# Patient Record
Sex: Male | Born: 1935 | Race: Black or African American | Hispanic: No | Smoking: Former smoker
Health system: Southern US, Community
[De-identification: ages and names within clinical notes are randomized; demographics above are authoritative.]

## PROBLEM LIST (undated history)

## (undated) DIAGNOSIS — Z923 Personal history of irradiation: Secondary | ICD-10-CM

## (undated) DIAGNOSIS — D649 Anemia, unspecified: Secondary | ICD-10-CM

## (undated) DIAGNOSIS — H409 Unspecified glaucoma: Secondary | ICD-10-CM

## (undated) DIAGNOSIS — Z9289 Personal history of other medical treatment: Secondary | ICD-10-CM

## (undated) DIAGNOSIS — E7800 Pure hypercholesterolemia, unspecified: Secondary | ICD-10-CM

## (undated) DIAGNOSIS — M1990 Unspecified osteoarthritis, unspecified site: Secondary | ICD-10-CM

## (undated) DIAGNOSIS — C61 Malignant neoplasm of prostate: Secondary | ICD-10-CM

## (undated) DIAGNOSIS — J45909 Unspecified asthma, uncomplicated: Secondary | ICD-10-CM

## (undated) DIAGNOSIS — C169 Malignant neoplasm of stomach, unspecified: Secondary | ICD-10-CM

## (undated) DIAGNOSIS — E119 Type 2 diabetes mellitus without complications: Secondary | ICD-10-CM

## (undated) DIAGNOSIS — J449 Chronic obstructive pulmonary disease, unspecified: Secondary | ICD-10-CM

## (undated) HISTORY — PX: TUMOR EXCISION: SHX421

## (undated) HISTORY — PX: CARDIAC CATHETERIZATION: SHX172

---

## 1997-11-30 ENCOUNTER — Other Ambulatory Visit: Admission: RE | Admit: 1997-11-30 | Discharge: 1997-11-30 | Payer: Self-pay | Admitting: Internal Medicine

## 1998-06-18 ENCOUNTER — Encounter: Payer: Self-pay | Admitting: Emergency Medicine

## 1998-06-18 ENCOUNTER — Inpatient Hospital Stay: Admission: EM | Admit: 1998-06-18 | Discharge: 1998-06-21 | Payer: Self-pay | Admitting: Emergency Medicine

## 1999-03-10 ENCOUNTER — Encounter: Payer: Self-pay | Admitting: Emergency Medicine

## 1999-03-10 ENCOUNTER — Emergency Department (HOSPITAL_COMMUNITY): Admission: EM | Admit: 1999-03-10 | Discharge: 1999-03-10 | Payer: Self-pay | Admitting: Emergency Medicine

## 1999-04-24 ENCOUNTER — Emergency Department (HOSPITAL_COMMUNITY): Admission: EM | Admit: 1999-04-24 | Discharge: 1999-04-24 | Payer: Self-pay | Admitting: Emergency Medicine

## 1999-04-24 ENCOUNTER — Encounter: Payer: Self-pay | Admitting: Emergency Medicine

## 1999-05-18 ENCOUNTER — Emergency Department (HOSPITAL_COMMUNITY): Admission: EM | Admit: 1999-05-18 | Discharge: 1999-05-18 | Payer: Self-pay | Admitting: Emergency Medicine

## 1999-05-18 ENCOUNTER — Encounter: Payer: Self-pay | Admitting: Emergency Medicine

## 1999-11-11 ENCOUNTER — Encounter: Payer: Self-pay | Admitting: Emergency Medicine

## 1999-11-11 ENCOUNTER — Emergency Department (HOSPITAL_COMMUNITY): Admission: EM | Admit: 1999-11-11 | Discharge: 1999-11-11 | Payer: Self-pay | Admitting: Emergency Medicine

## 2000-01-05 ENCOUNTER — Encounter: Admission: RE | Admit: 2000-01-05 | Discharge: 2000-01-05 | Payer: Self-pay | Admitting: Internal Medicine

## 2000-01-05 ENCOUNTER — Encounter: Payer: Self-pay | Admitting: Internal Medicine

## 2000-04-12 ENCOUNTER — Encounter: Admission: RE | Admit: 2000-04-12 | Discharge: 2000-04-19 | Payer: Self-pay | Admitting: Neurological Surgery

## 2000-09-15 HISTORY — PX: HEMORRHOID SURGERY: SHX153

## 2000-09-16 ENCOUNTER — Ambulatory Visit (HOSPITAL_COMMUNITY): Admission: RE | Admit: 2000-09-16 | Discharge: 2000-09-16 | Payer: Self-pay | Admitting: Gastroenterology

## 2000-09-17 ENCOUNTER — Encounter: Admission: RE | Admit: 2000-09-17 | Discharge: 2000-12-16 | Payer: Self-pay | Admitting: Internal Medicine

## 2000-09-29 ENCOUNTER — Encounter (INDEPENDENT_AMBULATORY_CARE_PROVIDER_SITE_OTHER): Payer: Self-pay | Admitting: Specialist

## 2000-09-29 ENCOUNTER — Ambulatory Visit (HOSPITAL_COMMUNITY): Admission: RE | Admit: 2000-09-29 | Discharge: 2000-09-29 | Payer: Self-pay | Admitting: General Surgery

## 2001-03-14 ENCOUNTER — Encounter: Admission: RE | Admit: 2001-03-14 | Discharge: 2001-06-12 | Payer: Self-pay | Admitting: Internal Medicine

## 2001-06-03 DIAGNOSIS — C61 Malignant neoplasm of prostate: Secondary | ICD-10-CM

## 2001-06-03 HISTORY — DX: Malignant neoplasm of prostate: C61

## 2001-08-29 ENCOUNTER — Encounter: Admission: RE | Admit: 2001-08-29 | Discharge: 2001-11-27 | Payer: Self-pay | Admitting: Internal Medicine

## 2001-12-19 ENCOUNTER — Encounter: Admission: RE | Admit: 2001-12-19 | Discharge: 2002-03-19 | Payer: Self-pay | Admitting: Internal Medicine

## 2002-05-23 ENCOUNTER — Encounter: Admission: RE | Admit: 2002-05-23 | Discharge: 2002-08-21 | Payer: Self-pay | Admitting: Internal Medicine

## 2002-06-30 ENCOUNTER — Encounter: Admission: RE | Admit: 2002-06-30 | Discharge: 2002-06-30 | Payer: Self-pay | Admitting: Urology

## 2002-06-30 ENCOUNTER — Encounter: Payer: Self-pay | Admitting: Urology

## 2002-09-19 ENCOUNTER — Encounter: Admission: RE | Admit: 2002-09-19 | Discharge: 2002-12-18 | Payer: Self-pay | Admitting: Internal Medicine

## 2002-12-25 ENCOUNTER — Ambulatory Visit: Admission: RE | Admit: 2002-12-25 | Discharge: 2003-01-26 | Payer: Self-pay | Admitting: *Deleted

## 2003-01-02 ENCOUNTER — Encounter: Payer: Self-pay | Admitting: Radiation Oncology

## 2003-01-02 ENCOUNTER — Ambulatory Visit (HOSPITAL_COMMUNITY): Admission: RE | Admit: 2003-01-02 | Discharge: 2003-01-02 | Payer: Self-pay | Admitting: Radiation Oncology

## 2003-03-16 ENCOUNTER — Ambulatory Visit: Admission: RE | Admit: 2003-03-16 | Discharge: 2003-06-14 | Payer: Self-pay | Admitting: Radiation Oncology

## 2003-03-22 ENCOUNTER — Encounter: Admission: RE | Admit: 2003-03-22 | Discharge: 2003-06-20 | Payer: Self-pay | Admitting: Internal Medicine

## 2003-06-25 ENCOUNTER — Ambulatory Visit: Admission: RE | Admit: 2003-06-25 | Discharge: 2003-06-25 | Payer: Self-pay | Admitting: Radiation Oncology

## 2003-06-27 ENCOUNTER — Encounter: Admission: RE | Admit: 2003-06-27 | Discharge: 2003-09-25 | Payer: Self-pay | Admitting: Internal Medicine

## 2003-12-24 ENCOUNTER — Ambulatory Visit: Admission: RE | Admit: 2003-12-24 | Discharge: 2003-12-24 | Payer: Self-pay | Admitting: Radiation Oncology

## 2003-12-25 ENCOUNTER — Ambulatory Visit: Admission: RE | Admit: 2003-12-25 | Discharge: 2003-12-26 | Payer: Self-pay | Admitting: Radiation Oncology

## 2004-02-05 ENCOUNTER — Encounter: Admission: RE | Admit: 2004-02-05 | Discharge: 2004-05-05 | Payer: Self-pay | Admitting: Internal Medicine

## 2004-04-29 ENCOUNTER — Ambulatory Visit: Admission: RE | Admit: 2004-04-29 | Discharge: 2004-04-29 | Payer: Self-pay | Admitting: Radiation Oncology

## 2004-05-05 ENCOUNTER — Ambulatory Visit: Admission: RE | Admit: 2004-05-05 | Discharge: 2004-05-05 | Payer: Self-pay | Admitting: Radiation Oncology

## 2004-08-13 ENCOUNTER — Encounter: Admission: RE | Admit: 2004-08-13 | Discharge: 2004-11-11 | Payer: Self-pay | Admitting: Internal Medicine

## 2004-09-09 ENCOUNTER — Encounter (INDEPENDENT_AMBULATORY_CARE_PROVIDER_SITE_OTHER): Payer: Self-pay | Admitting: Cardiology

## 2004-09-09 ENCOUNTER — Inpatient Hospital Stay (HOSPITAL_COMMUNITY): Admission: EM | Admit: 2004-09-09 | Discharge: 2004-09-11 | Payer: Self-pay | Admitting: Emergency Medicine

## 2004-11-14 ENCOUNTER — Ambulatory Visit (HOSPITAL_COMMUNITY): Admission: RE | Admit: 2004-11-14 | Discharge: 2004-11-14 | Payer: Self-pay | Admitting: Urology

## 2005-02-12 ENCOUNTER — Encounter: Admission: RE | Admit: 2005-02-12 | Discharge: 2005-05-13 | Payer: Self-pay | Admitting: Internal Medicine

## 2005-04-22 ENCOUNTER — Encounter: Admission: RE | Admit: 2005-04-22 | Discharge: 2005-04-22 | Payer: Self-pay | Admitting: Internal Medicine

## 2005-09-03 ENCOUNTER — Encounter: Admission: RE | Admit: 2005-09-03 | Discharge: 2005-09-03 | Payer: Self-pay | Admitting: Internal Medicine

## 2005-09-25 ENCOUNTER — Encounter: Admission: RE | Admit: 2005-09-25 | Discharge: 2005-09-25 | Payer: Self-pay | Admitting: Internal Medicine

## 2006-01-20 ENCOUNTER — Encounter: Admission: RE | Admit: 2006-01-20 | Discharge: 2006-04-20 | Payer: Self-pay | Admitting: Internal Medicine

## 2006-03-23 ENCOUNTER — Encounter: Admission: RE | Admit: 2006-03-23 | Discharge: 2006-06-21 | Payer: Self-pay | Admitting: Internal Medicine

## 2006-04-15 ENCOUNTER — Inpatient Hospital Stay (HOSPITAL_COMMUNITY): Admission: EM | Admit: 2006-04-15 | Discharge: 2006-04-17 | Payer: Self-pay | Admitting: Emergency Medicine

## 2006-10-16 ENCOUNTER — Emergency Department (HOSPITAL_COMMUNITY): Admission: EM | Admit: 2006-10-16 | Discharge: 2006-10-16 | Payer: Self-pay | Admitting: Emergency Medicine

## 2006-11-04 ENCOUNTER — Encounter: Admission: RE | Admit: 2006-11-04 | Discharge: 2006-11-04 | Payer: Self-pay | Admitting: Internal Medicine

## 2007-01-23 ENCOUNTER — Inpatient Hospital Stay (HOSPITAL_COMMUNITY): Admission: EM | Admit: 2007-01-23 | Discharge: 2007-01-24 | Payer: Self-pay | Admitting: Emergency Medicine

## 2007-02-03 ENCOUNTER — Encounter: Admission: RE | Admit: 2007-02-03 | Discharge: 2007-04-26 | Payer: Self-pay | Admitting: Internal Medicine

## 2007-05-04 ENCOUNTER — Encounter: Admission: RE | Admit: 2007-05-04 | Discharge: 2007-05-05 | Payer: Self-pay | Admitting: Internal Medicine

## 2007-08-24 ENCOUNTER — Emergency Department (HOSPITAL_COMMUNITY): Admission: EM | Admit: 2007-08-24 | Discharge: 2007-08-24 | Payer: Self-pay | Admitting: Emergency Medicine

## 2007-09-21 ENCOUNTER — Encounter: Admission: RE | Admit: 2007-09-21 | Discharge: 2007-09-21 | Payer: Self-pay | Admitting: Internal Medicine

## 2007-12-12 ENCOUNTER — Encounter: Admission: RE | Admit: 2007-12-12 | Discharge: 2008-02-07 | Payer: Self-pay | Admitting: Internal Medicine

## 2008-08-07 ENCOUNTER — Encounter: Admission: RE | Admit: 2008-08-07 | Discharge: 2008-08-07 | Payer: Self-pay | Admitting: Internal Medicine

## 2008-11-12 ENCOUNTER — Emergency Department (HOSPITAL_COMMUNITY): Admission: EM | Admit: 2008-11-12 | Discharge: 2008-11-12 | Payer: Self-pay | Admitting: Emergency Medicine

## 2008-12-13 ENCOUNTER — Ambulatory Visit (HOSPITAL_COMMUNITY): Admission: RE | Admit: 2008-12-13 | Discharge: 2008-12-13 | Payer: Self-pay | Admitting: Urology

## 2010-01-16 ENCOUNTER — Ambulatory Visit (HOSPITAL_COMMUNITY): Admission: RE | Admit: 2010-01-16 | Discharge: 2010-01-16 | Payer: Self-pay | Admitting: Urology

## 2010-07-29 ENCOUNTER — Other Ambulatory Visit: Payer: Self-pay | Admitting: Internal Medicine

## 2010-07-29 ENCOUNTER — Ambulatory Visit
Admission: RE | Admit: 2010-07-29 | Discharge: 2010-07-29 | Disposition: A | Discharge status: Home / Self Care | Payer: Medicaid Other | Source: Ambulatory Visit | Attending: Internal Medicine | Admitting: Internal Medicine

## 2010-07-29 DIAGNOSIS — M25569 Pain in unspecified knee: Secondary | ICD-10-CM

## 2010-08-25 LAB — POCT CARDIAC MARKERS: CKMB, poc: 1.8 ng/mL (ref 1.0–8.0)

## 2010-08-25 LAB — CBC
HCT: 38.1 % — ABNORMAL LOW (ref 39.0–52.0)
MCHC: 33 g/dL (ref 30.0–36.0)
Platelets: 221 10*3/uL (ref 150–400)
RDW: 12.5 % (ref 11.5–15.5)
WBC: 5.8 10*3/uL (ref 4.0–10.5)

## 2010-08-25 LAB — URINALYSIS, ROUTINE W REFLEX MICROSCOPIC
Bilirubin Urine: NEGATIVE
Nitrite: NEGATIVE
Specific Gravity, Urine: 1.021 (ref 1.005–1.030)

## 2010-08-25 LAB — BASIC METABOLIC PANEL
CO2: 28 mEq/L (ref 19–32)
Calcium: 8.9 mg/dL (ref 8.4–10.5)
Chloride: 104 mEq/L (ref 96–112)
Creatinine, Ser: 0.8 mg/dL (ref 0.4–1.5)
GFR calc non Af Amer: >60 mL/min (ref >60)
Potassium: 3.7 mEq/L (ref 3.5–5.1)

## 2010-08-25 LAB — DIFFERENTIAL
Basophils Absolute: 0 10*3/uL (ref 0.0–0.1)
Eosinophils Absolute: 0.2 10*3/uL (ref 0.0–0.7)
Lymphocytes Relative: 16 % (ref 12–46)
Lymphs Abs: 0.9 10*3/uL (ref 0.7–4.0)
Monocytes Relative: 7 % (ref 3–12)

## 2010-09-30 NOTE — Discharge Summary (Signed)
Melvin Gardner, Melvin Gardner               ACCOUNT NO.:  [id number]   MEDICAL RECORD NO.:  [id number]          PATIENT TYPE:  INP   LOCATION:  4705                         FACILITY:  [facility]   PHYSICIAN:  Isidor Holts, M.D.  DATE OF BIRTH:  [birth year]   DATE OF ADMISSION:  01/23/2007  DATE OF DISCHARGE:  01/24/2007                               DISCHARGE SUMMARY   DISCHARGE DIAGNOSES:  1. Gait instability.  2. History of hypertension.  3. Type 2 diabetes mellitus.  4. Diverticular disease.  5. 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  6. Prior history of carcinoma of the prostate.  7. Glaucoma.   DISCHARGE MEDICATIONS:  1. Albuterol MDI 2 puffs p.r.n.  2. Flomax 0.5 mg p.o. daily.  3. Metformin 500 mg p.o. daily.  4. Aspirin 325 mg p.o. daily.   PROCEDURE:  1. Brain MRI dated January 23, 2007, this showed no acute infarct.      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 Also pan      sinus mucosal thickening with small air fluid levels in the      maxillary sinuses.  2. Brain MRA dated January 23, 2007. This was limited by motion      artifact; however, there is flow in the basilar artery, internal      carotid artery and major branches of such.  3. MRI thoracic spine dated January 23, 2007. This showed      postoperative changes in the left T6-7 through T8-9 levels. The      enhancement in this region including the enhancement of left T7,      particularly on facet as well as other signals recorded at this      level, may represent postoperative changes, however recurrent tumor      cannot be completely excluded and followup imaging is recommended      if there are progressive symptoms. There was cervical spondylitic      changes with spinal stenosis C3-4, C5-6 and C6-7 incompletely      evaluated on present exam.  4. Head CT scan dated January 23, 2007.  This showed no acute      intracranial findings.   CONSULTATIONS:  None.   ADMISSION HISTORY:  As per H&P note of January 23, 2007. However, in  brief, this is a 77 year old male with a known history of asthma/COPD,  hypertension, type 2 diabetes, mellitus, prior carcinoma of prostate,  diverticular disease, glaucoma, prior history of surgery for thoracic  spine tumor 1990, who presents with unsteadiness and left lower  extremity weakness of approximately 1 week duration.  He was admitted  for further evaluation, investigation and management.   CLINICAL COURSE:  1. Gait instability. For details of presentation, refer to admission      history above. On physical examination, the patient had no      cerebellar signs or objective weakness of upper and lower  extremities. However, it was felt that since he had risk factors      for possible CVA, it would be pertinent to attempt to rule out a      new CVA or a recurrence of his thoracic spine lesion for which he      had surgery in 1990 by Dr. Danielle Dess. Imaging studies were therefore      carried out and they were essentially negative for acute CVA.      Thoracic spine MRI showed predominantly postoperative changes      although it was impossible to rule out with certainty, recurrent      disease. The patient was seen by physical therapist during this      hospitalization and he was able to ambulate without any      difficulties whatsoever. Review of his lipid levels showed a total      cholesterol of 159, triglycerides 89, HDL 33, LDL 114. Given his      history of diabetes mellitus, his target LDL should be less than      100. We shall however defer management of his dyslipidemia to his      primary MD, Dr. Andi Devon.   1. Asthma/COPD. There were no symptoms referable to this, during the      course of the patient's hospitalization.   1. Type 2 diabetes mellitus. The patient remained euglycemic, on pre-      admission  dosages of Metformin as well as carbohydrate modified      diet, during the course of his hospitalization.   1. History of hypertension. The patient's blood pressure was      normotensive throughout the course of his hospitalization.   DISPOSITION:  The patient was considered clinically stable to be  discharged on January 24, 2007. At presentation, patient had mentioned  twinges of sharp left sided chest pain. There were no recurrences  during this hospitalization, EKG showed no acute ischemic changes, and  cardiac enzymes remained unelevated.   DIET:  Heart healthy diet.   ACTIVITY:  As tolerated.   FOLLOWUP:  The patient is recommended to followup with his PMD, Dr.  Andi Devon, per prior scheduled appointment.  Note: We have also discussed with Dr. Verlee Rossetti office, neurosurgeon, as  we feel that in view of thoracic MRI findings the patient will benefit  from neurosurgical followup, albeit on an outpatient basis. Dr. Verlee Rossetti  office has assured [place] that the office will call patient to schedule an  appointment after Dr. Danielle Dess reviews the MRI findings. Telephone number  [phone].   All of this has been communicated to the patient and he is agreeable to  this plan.      Isidor Holts, M.D.  Electronically Signed     CO/MEDQ  D:  01/24/2007  T:  01/24/2007  Job:  [id number]   cc:   Merlene Laughter. Renae Gloss, M.D.  Stefani Dama, M.D.

## 2010-09-30 NOTE — H&P (Signed)
Melvin Gardner, Melvin Gardner               ACCOUNT NO.:  [id number]   MEDICAL RECORD NO.:  [id number]          PATIENT TYPE:  INP   LOCATION:  4705                         FACILITY:  [facility]   PHYSICIAN:  Isidor Holts, M.D.  DATE OF BIRTH:  11/10/1929   DATE OF ADMISSION:  01/23/2007  DATE OF DISCHARGE:                              HISTORY & PHYSICAL   PRIMARY CARE PHYSICIAN:  Merlene Laughter. Renae Gloss, M.D.   CHIEF COMPLAINT:  Unsteadiness and left lower extremity weakness, for  approximately 1 week.   HISTORY OF PRESENT ILLNESS:  This is a 77 year old male.  For past  medical history, see below.  According to patient, who is a good  historian, he has been staggering for approximately 1 week now, and  tends to stumble towards the left.  He denies headache, dizziness,  blurred vision, slurring of speech, or difficulty swallowing.  He also  admits to some left lower extremity weakness.  He denies back pain  however.  He has no history of trauma.  According to patient, he became  concerned when last night at 11:00 p.m., after having had his evening  snack and watching TV, he tried to get up and found he was stumbling  quite a lot.  He eventually made it upstairs into bed and woke up this  morning with significant abnormal gait.  He therefore called the EMS.  He denies fever.  He denies shortness of breath.  However, he does state  that occasionally he experiences a twinge of sharp left-sided pain.   PAST MEDICAL HISTORY:  1. Asthma/COPD.  No exacerbation since November 2007.  2. Hypertension.  3  Type 2 diabetes mellitus.  1. Prior history of carcinoma of the prostate.  2. Diverticular disease.  3. Glaucoma.  4. History of surgery for thoracic spine tumor in 1990.   MEDICATIONS:  1. Albuterol MDI p.r.n.  2. Flomax 0.4 mg p.o. daily.  3. Metformin 500 mg p.o. daily.  4. Aspirin 325 mg daily.   ALLERGIES:  No known drug allergies.   REVIEW OF SYSTEMS:  Systems review, as per history of  present illness  and chief complaint, otherwise negative for mild pain, vomiting or  diarrhea.   SOCIAL HISTORY:  Patient is widowed, retired, has a girlfriend.  He is  an ex-smoker.  He quit smoking in 1950.  At that time, he also used to  smoke marijuana.  He denies alcohol abuse or drug abuse.   FAMILY HISTORY:  Patient's sister has a history of diabetes and  hypertension.  His brother passed away at the age of 73 years, cause  unknown.  Patient's mother also passed away at the age of 39 years,  though she had a history of diabetes mellitus and hypertension.  Family  history is otherwise noncontributory.   PHYSICAL EXAMINATION:  VITAL SIGNS:  Temperature 97.0, pulse 75 per  minute and regular, respiratory rate 16, BP 131/87 mmHg.  Pulse oximetry  95% on room air.  GENERAL:  Patient does not appear to be in obvious acute distress.  Alert, communicative, not short of breath at rest.  HEENT:  No clinical pallor, no jaundice or conjunctival injection.  NECK:  Supple.  JVP not seen.  No palpable lymphadenopathy.  No palpable  goiter.  No carotid bruits.  CHEST:  Lungs are clear to auscultation.  No wheezes.  No crackles.  HEART:  Sounds 1 and 2 heard, normal, regular.  No murmurs.  ABDOMEN:  Flat, soft and nontender.  No palpable organomegaly.  No  palpable masses.  Normal bowel sounds.  EXTREMITIES:  Lower extremity examination no pitting edema.  Palpable  peripheral pulses.  MUSCULOSKELETAL:  Patient has a healed scar in the mid thoracic spine  vertically; however, no associated tenderness.  He has full range of  motion of all joints.  He has no tenderness.  CENTRAL NERVOUS SYSTEM:  No neurologic deficit on gross examination.  Patient appears to have power at least 4+/5 all extremities.  He has no  cerebellar signs.   LABORATORY DATA:  CBC - WBC 5.7, hemoglobin 13.6, hematocrit 41.8,  platelets 251.  Electrolytes - sodium 138, potassium 4.3, chloride 106,  CO2 25, BUN 16,  creatinine 0.6, glucose 145.   ASSESSMENT/PLAN:  1. Ataxia/left lower extremity weakness.  Patient has no cerebellar      signs on neurologic examination and power is at least 4+/5      bilaterally in both upper and lower extremities.  He, however, does      have risk factors for TIA/CVA.  It is also important to rule out      possible recurrence of his thoracic lesion, for which I have      therefore arranged brain MRI/MRA and thoracic spine MRI.  Continue      CVA workup with carotid duplex/vertebral duplex scan, lipid      profile, TSH, RPR and homocysteine levels, and also 2D      echocardiogram.   1. Atypical chest pain.  Per cardiac monitor, he was sinus rhythm with      occasional ectopics.  We shall do 12-lead EKG, cycle cardiac      enzymes, monitor telemetrically.   1. History of asthma/chronic obstructive pulmonary disease.  Currently      asymptomatic.  We shall utilize p.r.n. bronchodilator nebulizers.   1. Type 2 diabetes mellitus.  Query controlled.  We shall check his      A1c, hold Metformin, utilize sliding scale insulin coverage for      now.   Further management will dependent on clinical course.      Isidor Holts, M.D.  Electronically Signed     CO/MEDQ  D:  01/23/2007  T:  01/23/2007  Job:  [id number]   cc:   Merlene Laughter. Renae Gloss, M.D.

## 2010-10-03 NOTE — Procedures (Signed)
[facility] [facility]  Patient:    Melvin, Gardner                      MRN: [id number] Proc. Date: 09/16/00 Adm. Date:  [id number] Disc. Date: [id number] Attending:  Charna Elizabeth CC:         Merlene Laughter. Renae Gloss, M.D.  Timothy E. Earlene Plater, M.D.   Procedure Report  DATE OF BIRTH:  1929/09/05.  PROCEDURE:  Colonoscopy with snare polypectomy x 2 and hot biopsy x 1.  ENDOSCOPIST:  Anselmo Rod, M.D.  INSTRUMENT USED:  Olympus video colonoscope.  INDICATION FOR PROCEDURE:  A 76 year old African-American male with a history of rectal bleeding.  Rule out colonic polyps, masses, hemorrhoids, etc.  PREPROCEDURE PREPARATION:  Informed consent was procured from the patient. The patient was fasted for eight hours prior to the procedure and prepped with a bottle of magnesium citrate and a gallon of NuLytely the night prior to the procedure.  PREPROCEDURE PHYSICAL:  VITAL SIGNS:  The patient had stable vital signs.  NECK:  Supple.  CHEST:  Clear to auscultation.  S1, S2 regular.  ABDOMEN:  Soft with normal abdominal bowel sounds.  DESCRIPTION OF PROCEDURE:  The patient was placed in the left lateral decubitus position and sedated with 50 mg of Demerol and 5 mg of Versed intravenously.  Once the patient was adequately sedate and maintained on low-flow oxygen and continuous cardiac monitoring, the Olympus video colonoscope was advanced from the rectum to the cecum without difficulty. There was evidence of pan-diverticulosis throughout the colon with several large diverticula.  A small sessile polyp was hot biopsied from the rectum. Two small sessile polyps were removed by snare polypectomy from 80 cm.  There was a prominent prolapsing internal hemorrhoids seen on anal inspection.  The procedure was complete to the cecum.  The ileocecal valve and the appendiceal orifice were clearly visualized.  IMPRESSION: 1. Pan-diverticulosis. 2. Small sessile  polyp hot biopsied from the rectum. 3. Two small sessile polyps snared from 80 cm. 4. Prominent prolapsing internal hemorrhoids.  RECOMMENDATIONS:  1. The patient has been set up to see Dr. Lorelee New on Sep 20, 2000, for    surgical evaluation with regard to his prolapsing hemorrhoids. 2. Await pathology results. 3. Increase the fluid and fiber in the diet. 4. Outpatient follow-up in the next four weeks. DD:  09/16/00 TD:  09/18/00 Job: [id number]

## 2010-10-03 NOTE — Cardiovascular Report (Signed)
NAME:  Melvin Gardner, Melvin Gardner NO.:  [id number]   MEDICAL RECORD NO.:  [id number]          PATIENT TYPE:  INP   LOCATION:  3739                         FACILITY:  [facility]   PHYSICIAN:  Nanetta Batty, M.D.   DATE OF BIRTH:  1929-09-04   DATE OF PROCEDURE:  DATE OF DISCHARGE:                              CARDIAC CATHETERIZATION   [name] is a 77 year old African American male, a patient of Dr.  Mathews Robinsons, with a history of hypertension and diabetes.  Admitted with chest  pain.  He had borderline positive troponin.  Presents now for diagnostic  coronary arteriography.   PROCEDURE DESCRIPTION:  The patient was brought to the second floor [facility] in the post absorptive state.  He  was premedicated with p.o. Valium.  His right groin was prepped and shaved  in usual sterile fashion.  One percent Xylocaine was used for local  anesthesia.  Then 6 French right and left Judkins diagnostic catheters, as  well as a 6 French pigtail catheter, were used for selective coronary  angiography, left ventriculography and supravalvular aortography.  Visipaque  dye was used for the entirety of the case.  Retrograde aorta, left  ventricular and pullback pressures were recorded.   HEMODYNAMIC RESULTS:  Aortic systolic pressure 123 and diastolic pressure  78.  Left ventricular systolic pressure 139 and diastolic pressure 10.   SELECTIVE CORONARY ANGIOGRAPHY:  1.  Left main normal.  2.  LAD normal.  3.  Left circumflex normal.  4.  The right coronary artery was dominant and normal.  5.  Left ventriculography:  RAO left ventriculogram was performed using 25      mL of Visipaque dye at 12 mL/sec.  The overall LVEF was estimated at      greater than 60% without focal wall motion abnormalities.  6.  Supravalvular aortography performed in the LAO view using 20 mL of      Visipaque at 20 mL/sec.  The root was normal in caliber.  There was no      aortic  insufficiency or dissection noted.   IMPRESSION:  [name] has essentially normal coronary arteries, normal LV  function and normal aortic root.  I cannot find a cardiac reason for his  chest pain.  An ACT was measured.  The right femoral arterial puncture site  was sealed with a Star closure device with excellent hemostasis.  I  discussed the results with Dr. Renae Gloss.  The patient can be discharged home  later today and follow up with Dr. Renae Gloss.  He left the laboratory in  stable condition.      JB/MEDQ  D:  09/11/2004  T:  09/11/2004  Job:  [id number]   cc:   Second Floor Cardiac Catheterization Lab   [facility]  [address], [place] [address]   Merlene Laughter. Renae Gloss, M.D.  [address]  [address]  [place]  [place] [address]  Fax: [phone]

## 2010-10-03 NOTE — Op Note (Signed)
[facility]  Patient:    QUAMIR, WILLEMSEN                      MRN: [id number] Proc. Date: 09/29/00 Adm. Date:  [id number] Attending:  Carson Myrtle CC:         Anselmo Rod, M.D.   Operative Report  PREOPERATIVE DIAGNOSIS:  Internal and external hemorrhoids.  POSTOPERATIVE DIAGNOSIS:  Internal and external hemorrhoids.  PROCEDURE:  Hemorrhoidectomy complex.  SURGEON:  Timothy E. Earlene Plater, M.D.  ANESTHESIA:  General.  INDICATIONS:  Mr. Edmundson is a relatively healthy 77 year old black male with controlled hypertension and a very long history of protruding hemorrhoids. Recent colonoscopy was negative.  He is referred for consultation, and he elected to proceed with hemorrhoidectomy due to complete prolapse of his hemorrhoidal masses.  He has been completely explained and counseled, and he wishes to proceed at this time.  DESCRIPTION OF PROCEDURE:  The patient was brought to the operating room and placed supine.  LMA anesthesia provided.  He was placed in lithotomy position. The perianal was inspected and prepped and draped in the usual fashion.  The left lateral anorectum was completely prolapsed with rectal mucosa protruding through constantly.  A right posterior was third and fourth degree and a right anterior was third degree.  The sphincter that had seemed snug in the office now with the patient under anesthesia but without muscle relaxers, showed adequate relaxation.  There was no fissure or evidence of infection.  The anus was injected around and about with 0.25% Marcaine with epinephrine mixed 9:1 with Wydase, and this was massaged in well.  Attention was turned to the left side where the enormous hemorrhoid was grasped in clamps.  Its apex was sutured with a 2-0 chromic, and then the mass was completely excised from the surrounding tissue.  The same 2-0 suture was then used to close the wound by approximating the first mucosal edges,  then Anoderm on the skin edges. Undermining of the external portions of the hemorrhoid removed the superficial varicosities.  The wound was intact.  There was no bleeding.  The second hemorrhoid was the right posterior which was removed in a similar fashion.  It just was not quite as large and was not as wide.  The right anterior was band ligated only.  All areas were checked.  There was no bleeding.  The sphincter was intact.  He tolerated it well.  Gelfoam, gauze, and dry sterile dressing applied.  He was removed to the recovery room in good condition.  Written and verbal instructions were given him and his family, including Percocet #30, and he will be seen and followed as an outpatient. DD:  09/29/00 TD:  09/29/00 Job: [id number]

## 2011-02-10 LAB — URINALYSIS, ROUTINE W REFLEX MICROSCOPIC
Bilirubin Urine: NEGATIVE
Ketones, ur: NEGATIVE
Protein, ur: >300 — AB
Urobilinogen, UA: 1

## 2011-02-10 LAB — DIFFERENTIAL
Basophils Absolute: 0
Eosinophils Absolute: 0.4
Eosinophils Relative: 8 — ABNORMAL HIGH
Lymphs Abs: 0.9
Monocytes Absolute: 0.5

## 2011-02-10 LAB — URINE CULTURE

## 2011-02-10 LAB — BASIC METABOLIC PANEL
BUN: 17
CO2: 27
Chloride: 104
Glucose, Bld: 211 — ABNORMAL HIGH
Potassium: 3.6

## 2011-02-10 LAB — CBC
HCT: 36.3 — ABNORMAL LOW
MCV: 88.5
Platelets: 234
RDW: 12.4

## 2011-02-27 LAB — COMPREHENSIVE METABOLIC PANEL
Alkaline Phosphatase: 71
BUN: 16
CO2: 25
GFR calc non Af Amer: >60
Glucose, Bld: 145 — ABNORMAL HIGH
Potassium: 4.3
Total Bilirubin: 1
Total Protein: 6.7

## 2011-02-27 LAB — TSH: TSH: 0.881

## 2011-02-27 LAB — DIFFERENTIAL
Basophils Absolute: 0
Basophils Relative: 0
Eosinophils Absolute: 0.3
Monocytes Relative: 8
Neutro Abs: 4.2
Neutrophils Relative %: 73

## 2011-02-27 LAB — CBC
HCT: 41.3
Hemoglobin: 13.6
RBC: 4.62
RDW: 12.4

## 2011-02-27 LAB — CARDIAC PANEL(CRET KIN+CKTOT+MB+TROPI)
CK, MB: 3.9
Relative Index: 3.8 — ABNORMAL HIGH
Relative Index: INVALID
Troponin I: 0.03

## 2011-02-27 LAB — URINALYSIS, ROUTINE W REFLEX MICROSCOPIC
Nitrite: NEGATIVE
Protein, ur: NEGATIVE
Specific Gravity, Urine: 1.024
Urobilinogen, UA: 0.2

## 2011-02-27 LAB — LIPID PANEL
HDL: 33 — ABNORMAL LOW
Triglycerides: 59
VLDL: 12

## 2011-02-27 LAB — PROTIME-INR
INR: 1
Prothrombin Time: 13.2

## 2011-02-27 LAB — CK TOTAL AND CKMB (NOT AT ARMC)
CK, MB: 4.7 — ABNORMAL HIGH
Total CK: 107

## 2011-02-27 LAB — HOMOCYSTEINE: Homocysteine: 8.7

## 2011-02-27 LAB — HEMOGLOBIN A1C
Hgb A1c MFr Bld: 6.6 — ABNORMAL HIGH
Mean Plasma Glucose: 158

## 2011-02-27 LAB — BASIC METABOLIC PANEL
BUN: 10
Chloride: 104
Creatinine, Ser: 0.75
GFR calc Af Amer: >60
GFR calc non Af Amer: >60
Potassium: 3.8

## 2011-02-27 LAB — URINE CULTURE

## 2012-10-31 ENCOUNTER — Emergency Department (HOSPITAL_COMMUNITY)
Admission: EM | Admit: 2012-10-31 | Discharge: 2012-10-31 | Disposition: A | Discharge status: Home / Self Care | Payer: Medicare Other | Attending: Emergency Medicine | Admitting: Emergency Medicine

## 2012-10-31 ENCOUNTER — Encounter (HOSPITAL_COMMUNITY): Payer: Self-pay | Admitting: Emergency Medicine

## 2012-10-31 ENCOUNTER — Emergency Department (HOSPITAL_COMMUNITY): Payer: Medicare Other

## 2012-10-31 DIAGNOSIS — E119 Type 2 diabetes mellitus without complications: Secondary | ICD-10-CM | POA: Insufficient documentation

## 2012-10-31 DIAGNOSIS — R609 Edema, unspecified: Secondary | ICD-10-CM | POA: Insufficient documentation

## 2012-10-31 DIAGNOSIS — R0789 Other chest pain: Secondary | ICD-10-CM | POA: Insufficient documentation

## 2012-10-31 DIAGNOSIS — R079 Chest pain, unspecified: Secondary | ICD-10-CM

## 2012-10-31 DIAGNOSIS — Z79899 Other long term (current) drug therapy: Secondary | ICD-10-CM | POA: Insufficient documentation

## 2012-10-31 DIAGNOSIS — C61 Malignant neoplasm of prostate: Secondary | ICD-10-CM | POA: Insufficient documentation

## 2012-10-31 DIAGNOSIS — Z7982 Long term (current) use of aspirin: Secondary | ICD-10-CM | POA: Insufficient documentation

## 2012-10-31 DIAGNOSIS — D649 Anemia, unspecified: Secondary | ICD-10-CM | POA: Insufficient documentation

## 2012-10-31 DIAGNOSIS — IMO0002 Reserved for concepts with insufficient information to code with codable children: Secondary | ICD-10-CM | POA: Insufficient documentation

## 2012-10-31 HISTORY — DX: Malignant neoplasm of prostate: C61

## 2012-10-31 LAB — CBC WITH DIFFERENTIAL/PLATELET
Basophils Absolute: 0 10*3/uL (ref 0.0–0.1)
Eosinophils Relative: 5 % (ref 0–5)
Lymphs Abs: 0.9 10*3/uL (ref 0.7–4.0)
MCH: 21.4 pg — ABNORMAL LOW (ref 26.0–34.0)
MCV: 72.4 fL — ABNORMAL LOW (ref 78.0–100.0)
Monocytes Absolute: 0.5 10*3/uL (ref 0.1–1.0)
Monocytes Relative: 9 % (ref 3–12)
Neutrophils Relative %: 70 % (ref 43–77)
Platelets: 244 10*3/uL (ref 150–400)
RBC: 3.37 MIL/uL — ABNORMAL LOW (ref 4.22–5.81)
RDW: 18 % — ABNORMAL HIGH (ref 11.5–15.5)
WBC: 5.6 10*3/uL (ref 4.0–10.5)

## 2012-10-31 LAB — POCT I-STAT TROPONIN I: Troponin i, poc: 0.01 ng/mL (ref 0.00–0.08)

## 2012-10-31 LAB — BASIC METABOLIC PANEL
CO2: 26 mEq/L (ref 19–32)
Calcium: 9.2 mg/dL (ref 8.4–10.5)
Creatinine, Ser: 0.58 mg/dL (ref 0.50–1.35)
GFR calc Af Amer: >90 mL/min (ref >90)
Sodium: 138 mEq/L (ref 135–145)

## 2012-10-31 LAB — TYPE AND SCREEN

## 2012-10-31 MED ORDER — ALBUTEROL SULFATE HFA 108 (90 BASE) MCG/ACT IN AERS
2.0000 | INHALATION_SPRAY | RESPIRATORY_TRACT | Status: DC | PRN
  Administered 2012-10-31: 2 via RESPIRATORY_TRACT
  Filled 2012-10-31: qty 6.7

## 2012-10-31 MED ORDER — ASPIRIN 81 MG PO CHEW
324.0000 mg | CHEWABLE_TABLET | Freq: Once | ORAL | Status: DC
  Filled 2012-10-31: qty 4

## 2012-10-31 NOTE — ED Notes (Signed)
Pt c/o central cp with no radiation that began around 12pm, after pain did not go away pt came to ED. Pt described pain as sharp stabbing in nature. Pt was diaphoretic upon EMS arrival but states he has that issue often. 12 lead unremarkable. Pt has hx of Prostate cancer, and DM. Vitals 116/74, 85 HR, 100 % 2L. CBG 95. 20g LH.

## 2012-10-31 NOTE — ED Notes (Signed)
Pt states his ankles are swollen and his feet feel numb.

## 2012-10-31 NOTE — ED Provider Notes (Signed)
History     CSN: [id number]  Arrival date & time 10/31/12  4098   First MD Initiated Contact with Patient 10/31/12 2010      Chief Complaint  Patient presents with  . Chest Pain   HPI  History provided by the patient. Patient is 79 year old male with history of diabetes and prostate cancer who presents for evaluation of episodes of sharp central chest pains earlier today. Patient states he first began having sharp needlelike pain to the left central chest after eating lunch this afternoon. He states pains seem to come and go for over a few hours in the afternoon. He denies having any other associated symptoms with these pains. He denied any pleuritic pain with breathing. Denied any cough or hemoptysis. Denied any shortness of breath. Denied any nausea or diaphoresis. Patient did not take any medications for his symptoms. He does report drinking some fluids to help with his belching. Denied any abdominal pains. He states it did not feel like having any reflux. Denies having similar symptoms previously. No prior significant cardiac history. No other aggravating or alleviating factors. No other associated symptoms.     Past Medical History  Diagnosis Date  . Diabetes mellitus without complication   . Prostate cancer     History reviewed. No pertinent past surgical history.  History reviewed. No pertinent family history.  History  Substance Use Topics  . Smoking status: Not on file  . Smokeless tobacco: Not on file  . Alcohol Use: Not on file      Review of Systems  Constitutional: Negative for fever, chills, diaphoresis and appetite change.  Respiratory: Negative for cough and shortness of breath.   Cardiovascular: Positive for chest pain.  Gastrointestinal: Negative for nausea, vomiting, abdominal pain, diarrhea and constipation.  All other systems reviewed and are negative.    Allergies  Review of patient's allergies indicates no known allergies.  Home Medications    Current Outpatient Rx  Name  Route  Sig  Dispense  Refill  . albuterol (PROVENTIL HFA;VENTOLIN HFA) 108 (90 BASE) MCG/ACT inhaler   Inhalation   Inhale 2 puffs into the lungs every 6 (six) hours as needed for wheezing.         [name] albuterol (PROVENTIL) (2.5 MG/3ML) 0.083% nebulizer solution   Nebulization   Take 2.5 mg by nebulization every 6 (six) hours as needed for wheezing.         [name] aspirin EC 325 MG tablet   Oral   Take 325 mg by mouth daily.         . Fluticasone-Salmeterol (ADVAIR) 250-50 MCG/DOSE AEPB   Inhalation   Inhale 1 puff into the lungs every 12 (twelve) hours.         [name] latanoprost (XALATAN) 0.005 % ophthalmic solution   Both Eyes   Place 1 drop into both eyes at bedtime.         . metFORMIN (GLUCOPHAGE) 500 MG tablet   Oral   Take 1,000 mg by mouth 2 (two) times daily with a meal.         . rosuvastatin (CRESTOR) 5 MG tablet   Oral   Take 5 mg by mouth daily.         . sitaGLIPtin (JANUVIA) 50 MG tablet   Oral   Take 50 mg by mouth daily.         . tamsulosin (FLOMAX) 0.4 MG CAPS   Oral   Take 0.4 mg by mouth daily after breakfast.         .  Vitamin D, Ergocalciferol, (DRISDOL) 50000 UNITS CAPS   Oral   Take 50,000 Units by mouth 2 (two) times a week.           BP 115/70  Pulse 67  Resp 16  SpO2 99%  Physical Exam  Nursing note and vitals reviewed. Constitutional: He appears well-developed and well-nourished. No distress.  HENT:  Head: Normocephalic.  Cardiovascular: Normal rate and regular rhythm.   No murmur heard. Pulmonary/Chest: Effort normal and breath sounds normal. No respiratory distress. He has no wheezes. He has no rales. He exhibits no tenderness.  Abdominal: Soft. There is no tenderness. There is no rebound and no guarding.  Musculoskeletal: He exhibits edema. He exhibits no tenderness.  There is mild edema to bilateral lower feet and ankles. No pain or tenderness. Skin appears normal in color and  temperature.  Skin: Skin is warm. No rash noted. No erythema.  Psychiatric: He has a normal mood and affect. His behavior is normal.    ED Course  Procedures   Results for orders placed during the hospital encounter of 10/31/12  CBC WITH DIFFERENTIAL      Result Value Range   WBC 5.6  4.0 - 10.5 K/uL   RBC 3.37 (*) 4.22 - 5.81 MIL/uL   Hemoglobin 7.2 (*) 13.0 - 17.0 g/dL   HCT 16.1 (*) 09.6 - 04.5 %   MCV 72.4 (*) 78.0 - 100.0 fL   MCH 21.4 (*) 26.0 - 34.0 pg   MCHC 29.5 (*) 30.0 - 36.0 g/dL   RDW 40.9 (*) 81.1 - 91.4 %   Platelets 244  150 - 400 K/uL   Neutrophils Relative % 70  43 - 77 %   Lymphocytes Relative 16  12 - 46 %   Monocytes Relative 9  3 - 12 %   Eosinophils Relative 5  0 - 5 %   Basophils Relative 0  0 - 1 %   Neutro Abs 3.9  1.7 - 7.7 K/uL   Lymphs Abs 0.9  0.7 - 4.0 K/uL   Monocytes Absolute 0.5  0.1 - 1.0 K/uL   Eosinophils Absolute 0.3  0.0 - 0.7 K/uL   Basophils Absolute 0.0  0.0 - 0.1 K/uL   RBC Morphology POLYCHROMASIA PRESENT    BASIC METABOLIC PANEL      Result Value Range   Sodium 138  135 - 145 mEq/L   Potassium 3.6  3.5 - 5.1 mEq/L   Chloride 103  96 - 112 mEq/L   CO2 26  19 - 32 mEq/L   Glucose, Bld 120 (*) 70 - 99 mg/dL   BUN 19  6 - 23 mg/dL   Creatinine, Ser 7.82  0.50 - 1.35 mg/dL   Calcium 9.2  8.4 - 95.6 mg/dL   GFR calc non Af Amer >90  >90 mL/min   GFR calc Af Amer >90  >90 mL/min  POCT I-STAT TROPONIN I      Result Value Range   Troponin i, poc 0.01  0.00 - 0.08 ng/mL   Comment 3           OCCULT BLOOD, POC DEVICE      Result Value Range   Fecal Occult Bld NEGATIVE  NEGATIVE  TYPE AND SCREEN      Result Value Range   ABO/RH(D) O POS     Antibody Screen NEG     Sample Expiration 11/03/2012    ABO/RH      Result Value Range   ABO/RH(D)  O POS         Dg Chest 2 View  10/31/2012   *RADIOLOGY REPORT*  Clinical Data: Chest pain.  CHEST - 2 VIEW  Comparison: Chest x-ray 11/12/2008.  Findings: Eventration of the right  hemidiaphragm.  Mild elevation of the left hemidiaphragm.  No acute consolidative airspace disease.  No pleural effusions.  Pulmonary vasculature is normal. Heart size is normal.  Upper mediastinal contours are unremarkable. Atherosclerosis in the thoracic aorta.  IMPRESSION: 1.  No radiographic evidence of acute cardiopulmonary disease.  The appearance of chest is similar to prior studies, as above.   Original Report Authenticated By: Trudie Reed, M.D.     1. Chest pain   2. Anemia       MDM  9:00 PM patient seen and evaluated. Patient appears well. Currently denies any symptoms or pain. He states he feels well and wishes he could go home.  Patient was also seen and evaluated with attending physician. Patient does appear to be much more anemic today than in prior labs. It has been several years since prior labs. He is on medications for his prostate cancer. Patient was offered admission for his anemia at this time does not wish to stay. He he was advised of any risks for returning home but chooses to return home. He knows he may return at anytime if he changes his mind. He has been instructed to follow up with his primary care provider in specialist to discuss his lab findings.    Date: 10/31/2012  Rate: 80  Rhythm: normal sinus rhythm  QRS Axis: normal  Intervals: normal  ST/T Wave abnormalities: normal  Conduction Disutrbances:none  Narrative Interpretation:   Old EKG Reviewed: none available          Angus Seller, PA-C 11/01/12 [phone]

## 2012-11-01 NOTE — ED Provider Notes (Signed)
I saw and evaluated the patient.  I agree with the resident's note (Dr. Littie Deeds).  On my exam the patient was in no distress.  After lengthy discussion of the patient's findings, dedicated for admission.  The patient, though, had capacity to request discharge, and this request was accommodated.  I saw the ECG, relevant labs and studies - I agree with the interpretation.   Gerhard Munch, MD 11/01/12 2201

## 2013-11-23 ENCOUNTER — Inpatient Hospital Stay (HOSPITAL_COMMUNITY)
Admission: EM | Admit: 2013-11-23 | Discharge: 2013-11-25 | DRG: 379 | Disposition: A | Discharge status: Home / Self Care | Payer: Medicare Other | Attending: Internal Medicine | Admitting: Internal Medicine

## 2013-11-23 ENCOUNTER — Encounter (HOSPITAL_COMMUNITY): Payer: Self-pay | Admitting: Emergency Medicine

## 2013-11-23 DIAGNOSIS — J449 Chronic obstructive pulmonary disease, unspecified: Secondary | ICD-10-CM | POA: Diagnosis present

## 2013-11-23 DIAGNOSIS — K2990 Gastroduodenitis, unspecified, without bleeding: Secondary | ICD-10-CM

## 2013-11-23 DIAGNOSIS — D649 Anemia, unspecified: Secondary | ICD-10-CM

## 2013-11-23 DIAGNOSIS — K5730 Diverticulosis of large intestine without perforation or abscess without bleeding: Secondary | ICD-10-CM | POA: Diagnosis present

## 2013-11-23 DIAGNOSIS — Z833 Family history of diabetes mellitus: Secondary | ICD-10-CM

## 2013-11-23 DIAGNOSIS — E119 Type 2 diabetes mellitus without complications: Secondary | ICD-10-CM

## 2013-11-23 DIAGNOSIS — Z79899 Other long term (current) drug therapy: Secondary | ICD-10-CM

## 2013-11-23 DIAGNOSIS — K921 Melena: Secondary | ICD-10-CM | POA: Diagnosis present

## 2013-11-23 DIAGNOSIS — Z87891 Personal history of nicotine dependence: Secondary | ICD-10-CM

## 2013-11-23 DIAGNOSIS — K2970 Gastritis, unspecified, without bleeding: Secondary | ICD-10-CM | POA: Diagnosis present

## 2013-11-23 DIAGNOSIS — K922 Gastrointestinal hemorrhage, unspecified: Secondary | ICD-10-CM | POA: Diagnosis present

## 2013-11-23 DIAGNOSIS — Z9289 Personal history of other medical treatment: Secondary | ICD-10-CM

## 2013-11-23 DIAGNOSIS — J4489 Other specified chronic obstructive pulmonary disease: Secondary | ICD-10-CM | POA: Diagnosis present

## 2013-11-23 DIAGNOSIS — D500 Iron deficiency anemia secondary to blood loss (chronic): Secondary | ICD-10-CM | POA: Diagnosis present

## 2013-11-23 DIAGNOSIS — F1210 Cannabis abuse, uncomplicated: Secondary | ICD-10-CM | POA: Diagnosis present

## 2013-11-23 DIAGNOSIS — K254 Chronic or unspecified gastric ulcer with hemorrhage: Principal | ICD-10-CM | POA: Diagnosis present

## 2013-11-23 DIAGNOSIS — C61 Malignant neoplasm of prostate: Secondary | ICD-10-CM

## 2013-11-23 DIAGNOSIS — E7800 Pure hypercholesterolemia, unspecified: Secondary | ICD-10-CM | POA: Diagnosis present

## 2013-11-23 HISTORY — DX: Chronic obstructive pulmonary disease, unspecified: J44.9

## 2013-11-23 HISTORY — DX: Anemia, unspecified: D64.9

## 2013-11-23 HISTORY — DX: Pure hypercholesterolemia, unspecified: E78.00

## 2013-11-23 HISTORY — DX: Personal history of other medical treatment: Z92.89

## 2013-11-23 HISTORY — DX: Unspecified asthma, uncomplicated: J45.909

## 2013-11-23 HISTORY — DX: Type 2 diabetes mellitus without complications: E11.9

## 2013-11-23 HISTORY — DX: Unspecified osteoarthritis, unspecified site: M19.90

## 2013-11-23 LAB — BASIC METABOLIC PANEL
Anion gap: 14 (ref 5–15)
BUN: 13 mg/dL (ref 6–23)
CALCIUM: 9 mg/dL (ref 8.4–10.5)
CO2: 23 mEq/L (ref 19–32)
Chloride: 106 mEq/L (ref 96–112)
Creatinine, Ser: 0.58 mg/dL (ref 0.50–1.35)
GFR calc Af Amer: >90 mL/min (ref >90)
GLUCOSE: 195 mg/dL — AB (ref 70–99)
Potassium: 4 mEq/L (ref 3.7–5.3)
Sodium: 143 mEq/L (ref 137–147)

## 2013-11-23 LAB — CBC
HEMATOCRIT: 25.3 % — AB (ref 39.0–52.0)
HEMOGLOBIN: 7.1 g/dL — AB (ref 13.0–17.0)
MCH: 20.8 pg — AB (ref 26.0–34.0)
MCHC: 28.1 g/dL — AB (ref 30.0–36.0)
MCV: 74.2 fL — ABNORMAL LOW (ref 78.0–100.0)
Platelets: 250 10*3/uL (ref 150–400)
RBC: 3.41 MIL/uL — ABNORMAL LOW (ref 4.22–5.81)
RDW: 20.8 % — ABNORMAL HIGH (ref 11.5–15.5)
WBC: 5.3 10*3/uL (ref 4.0–10.5)

## 2013-11-23 LAB — PREPARE RBC (CROSSMATCH)

## 2013-11-23 MED ORDER — DIPHENHYDRAMINE HCL 12.5 MG/5ML PO ELIX
25.0000 mg | ORAL_SOLUTION | Freq: Once | ORAL | Status: DC
  Filled 2013-11-23: qty 10

## 2013-11-23 MED ORDER — SODIUM CHLORIDE 0.9 % IJ SOLN
3.0000 mL | Freq: Two times a day (BID) | INTRAMUSCULAR | Status: DC
  Administered 2013-11-24 – 2013-11-25 (×2): 3 mL via INTRAVENOUS

## 2013-11-23 MED ORDER — DIPHENHYDRAMINE HCL 50 MG/ML IJ SOLN
50.0000 mg | Freq: Once | INTRAMUSCULAR | Status: AC
  Administered 2013-11-23: 50 mg via INTRAVENOUS

## 2013-11-23 MED ORDER — ONDANSETRON HCL 4 MG PO TABS: 4.0000 mg | ORAL_TABLET | Freq: Four times a day (QID) | ORAL | Status: DC | PRN

## 2013-11-23 MED ORDER — ATORVASTATIN CALCIUM 20 MG PO TABS
20.0000 mg | ORAL_TABLET | Freq: Every day | ORAL | Status: DC
  Administered 2013-11-24: 20 mg via ORAL
  Filled 2013-11-23 (×2): qty 1

## 2013-11-23 MED ORDER — ACETAMINOPHEN 650 MG RE SUPP: 650.0000 mg | Freq: Four times a day (QID) | RECTAL | Status: DC | PRN

## 2013-11-23 MED ORDER — SODIUM CHLORIDE 0.9 % IV SOLN
INTRAVENOUS | Status: AC
  Administered 2013-11-23: 1000 mL via INTRAVENOUS

## 2013-11-23 MED ORDER — DIPHENHYDRAMINE HCL 50 MG/ML IJ SOLN
INTRAMUSCULAR | Status: AC
  Filled 2013-11-23: qty 1

## 2013-11-23 MED ORDER — ALBUTEROL SULFATE (2.5 MG/3ML) 0.083% IN NEBU
2.5000 mg | INHALATION_SOLUTION | Freq: Four times a day (QID) | RESPIRATORY_TRACT | Status: DC | PRN
  Administered 2013-11-23 – 2013-11-24 (×2): 2.5 mg via RESPIRATORY_TRACT
  Filled 2013-11-23 (×2): qty 3

## 2013-11-23 MED ORDER — ALBUTEROL SULFATE HFA 108 (90 BASE) MCG/ACT IN AERS: 2.0000 | INHALATION_SPRAY | Freq: Four times a day (QID) | RESPIRATORY_TRACT | Status: DC | PRN

## 2013-11-23 MED ORDER — TAMSULOSIN HCL 0.4 MG PO CAPS
0.4000 mg | ORAL_CAPSULE | Freq: Every day | ORAL | Status: DC
  Administered 2013-11-24 – 2013-11-25 (×2): 0.4 mg via ORAL
  Filled 2013-11-23 (×3): qty 1

## 2013-11-23 MED ORDER — SODIUM CHLORIDE 0.9 % IV SOLN: 250.0000 mL | INTRAVENOUS | Status: DC | PRN

## 2013-11-23 MED ORDER — POLYETHYLENE GLYCOL 3350 17 G PO PACK
17.0000 g | PACK | ORAL | Status: AC
  Administered 2013-11-23 (×4): 17 g via ORAL
  Filled 2013-11-23 (×5): qty 1

## 2013-11-23 MED ORDER — MOMETASONE FURO-FORMOTEROL FUM 100-5 MCG/ACT IN AERO
2.0000 | INHALATION_SPRAY | Freq: Two times a day (BID) | RESPIRATORY_TRACT | Status: DC
  Administered 2013-11-23 – 2013-11-25 (×4): 2 via RESPIRATORY_TRACT
  Filled 2013-11-23: qty 8.8

## 2013-11-23 MED ORDER — ACETAMINOPHEN 325 MG PO TABS: 650.0000 mg | ORAL_TABLET | Freq: Four times a day (QID) | ORAL | Status: DC | PRN

## 2013-11-23 MED ORDER — ONDANSETRON HCL 4 MG/2ML IJ SOLN: 4.0000 mg | Freq: Four times a day (QID) | INTRAMUSCULAR | Status: DC | PRN

## 2013-11-23 MED ORDER — BICALUTAMIDE 50 MG PO TABS
50.0000 mg | ORAL_TABLET | Freq: Every day | ORAL | Status: DC
  Administered 2013-11-24 – 2013-11-25 (×2): 50 mg via ORAL
  Filled 2013-11-23 (×2): qty 1

## 2013-11-23 MED ORDER — SODIUM CHLORIDE 0.9 % IJ SOLN: 3.0000 mL | INTRAMUSCULAR | Status: DC | PRN

## 2013-11-23 MED ORDER — FERROUS SULFATE 325 (65 FE) MG PO TABS
325.0000 mg | ORAL_TABLET | Freq: Every day | ORAL | Status: DC
  Administered 2013-11-24 – 2013-11-25 (×2): 325 mg via ORAL
  Filled 2013-11-23 (×3): qty 1

## 2013-11-23 NOTE — Progress Notes (Signed)
Patient trasfered from ED to 5W10 via stretcher; alert and oriented x 4; no complaints of pain; IV saline locked in LFA; skin intact. Orient patient to room and unit; gave patient care guide; instructed how to use the call bell and  fall risk precautions. Will continue to monitor the patient.  

## 2013-11-23 NOTE — ED Notes (Signed)
Per pt sts sent here by doctor for low Hgb and blood transfusion. Pt has been taking iron pills. Denies chest pain, SOB. Denies weakness. sts he feels normal.

## 2013-11-23 NOTE — ED Notes (Signed)
Verified type & cross with 2nd RN

## 2013-11-23 NOTE — Progress Notes (Signed)
At 19:00 o'clock patient was complaining of chest pain and SOB. Dr. Aileen Fass recommended to stop blood administration and to give  Benadryl 50mg  IV once and to recheck patient in 15 minutes. After 15 minutes patient verbalized that he is feeling better. Blood administration was restarted. Will continue to monitor.

## 2013-11-23 NOTE — ED Notes (Signed)
PT reports that he had one black tarry stool a few days ago but that his stool has returned to normal today. Denies fatigue or weakness. PT states he was called out of the blue by his doc from a normal checkup and they told him his HGB was low and to come here

## 2013-11-23 NOTE — H&P (Signed)
[facility] History and Physical  Melvin Gardner BWG:[id number] DOB: 03-Mar-1930 DOA: 11/23/2013  Referring physician: Dr. Collene Mares PCP: [name]., MD   Chief Complaint: melena  HPI: Melvin Gardner is a 80 y.o. male  Past medical history of prostate cancer, who went to his PCP on Monday got called today to come to the ED because of low hemoglobin. He relates he's only had one or 2 black stools 3 or 4 days prior to admission. None today. He denies any abdominal pain nausea vomiting, shortness of breath, chest pain. He relates his last colonoscopy was about 5 years ago by Dr. Collene Mares and he just found some polyps. Denies any NSAIDs use.  In the ED: A CBC was checked that showed a hemoglobin of 7.6 his previous hemoglobin in 2010 was 12, a rectal exam was done that showed melena so we're consulted for further evaluation.   Review of Systems:  Constitutional:  No weight loss, night sweats, Fevers, chills, fatigue.  HEENT:  No headaches, Difficulty swallowing,Tooth/dental problems,Sore throat,  No sneezing, itching, ear ache, nasal congestion, post nasal drip,  Cardio-vascular:  No chest pain, Orthopnea, PND, swelling in lower extremities, anasarca, dizziness, palpitations  GI:  No heartburn, indigestion, abdominal pain, nausea, vomiting, diarrhea, change in bowel habits, loss of appetite  Resp:  No shortness of breath with exertion or at rest. No excess mucus, no productive cough, No non-productive cough, No coughing up of blood.No change in color of mucus.No wheezing.No chest wall deformity  Skin:  no rash or lesions.  GU:  no dysuria, change in color of urine, no urgency or frequency. No flank pain.  Musculoskeletal:  No joint pain or swelling. No decreased range of motion. No back pain.  Psych:  No change in mood or affect. No depression or anxiety. No memory loss.   Past Medical History  Diagnosis Date  . Diabetes mellitus without complication   . Prostate cancer     . Asthma    History reviewed. No pertinent past surgical history. Social History:  reports that he has never smoked. He does not have any smokeless tobacco history on file. He reports that he does not drink alcohol or use illicit drugs.  No Known Allergies  Family History  Problem Relation Age of Onset  . Diabetes Mellitus II Mother   . Other Father      Prior to Admission medications   Medication Sig Start Date End Date Taking? Authorizing Provider  albuterol (PROVENTIL HFA;VENTOLIN HFA) 108 (90 BASE) MCG/ACT inhaler Inhale 2 puffs into the lungs every 6 (six) hours as needed for wheezing.   Yes Historical Provider, MD  albuterol (PROVENTIL) (2.5 MG/3ML) 0.083% nebulizer solution Take 2.5 mg by nebulization every 6 (six) hours as needed for wheezing.   Yes Historical Provider, MD  aspirin EC 325 MG tablet Take 325 mg by mouth daily.   Yes Historical Provider, MD  bicalutamide (CASODEX) 50 MG tablet Take 50 mg by mouth daily. 11/16/13  Yes Historical Provider, MD  ferrous sulfate 325 (65 FE) MG tablet Take 325 mg by mouth daily with breakfast.   Yes Historical Provider, MD  Fluticasone-Salmeterol (ADVAIR) 250-50 MCG/DOSE AEPB Inhale 1 puff into the lungs every 12 (twelve) hours.   Yes Historical Provider, MD  metFORMIN (GLUCOPHAGE) 500 MG tablet Take 1,000 mg by mouth 2 (two) times daily with a meal.   Yes Historical Provider, MD  rosuvastatin (CRESTOR) 10 MG tablet Take 10 mg by mouth daily.   Yes Historical  Provider, MD  sitaGLIPtin (JANUVIA) 50 MG tablet Take 50 mg by mouth daily.   Yes Historical Provider, MD  tamsulosin (FLOMAX) 0.4 MG CAPS Take 0.4 mg by mouth daily after breakfast.   Yes Historical Provider, MD   Physical Exam: Filed Vitals:   11/23/13 1600  BP: 127/58  Pulse: 65  Temp:   Resp: 25    BP 127/58  Pulse 65  Temp(Src) 98.1 F (36.7 C) (Oral)  Resp 25  Ht 5\' 9"  (1.753 m)  Wt 89.359 kg (197 lb)  BMI 29.08 kg/m2  SpO2 100%  General:  Appears calm and  comfortable Eyes: PERRL, normal lids, irises & conjunctiva ENT: grossly normal hearing, lips & tongue Neck: no LAD, masses or thyromegaly Cardiovascular: RRR, no m/r/g. No LE edema. Respiratory: CTA bilaterally, no w/r/r. Normal respiratory effort. Abdomen: soft, ntnd positive bowel sounds Skin: no rash or induration seen on limited exam Musculoskeletal: grossly normal tone BUE/BLE Psychiatric: grossly normal mood and affect, speech fluent and appropriate Neurologic: grossly non-focal.          Labs on Admission:  Basic Metabolic Panel:  Recent Labs Lab 11/23/13 1410  NA 143  K 4.0  CL 106  CO2 23  GLUCOSE 195*  BUN 13  CREATININE 0.58  CALCIUM 9.0   Liver Function Tests: No results found for this basename: AST, ALT, ALKPHOS, BILITOT, PROT, ALBUMIN,  in the last 168 hours No results found for this basename: LIPASE, AMYLASE,  in the last 168 hours No results found for this basename: AMMONIA,  in the last 168 hours CBC:  Recent Labs Lab 11/23/13 1410  WBC 5.3  HGB 7.1*  HCT 25.3*  MCV 74.2*  PLT 250   Cardiac Enzymes: No results found for this basename: CKTOTAL, CKMB, CKMBINDEX, TROPONINI,  in the last 168 hours  BNP (last 3 results) No results found for this basename: PROBNP,  in the last 8760 hours CBG: No results found for this basename: GLUCAP,  in the last 168 hours  Radiological Exams on Admission: No results found.  EKG: Independently reviewed. none  Assessment/Plan Acute GI bleeding/  Melena - He has a history of diverticulosis, he denies any NSAIDs we'll go ahead type and screen and transfuse him 2 units of packed red blood cells. We'll place him n.p.o. after midnight, full liq diet have already called Dr. Collene Mares for possible colonoscopy on 11/24/2013. - Hold aspirin. Start him on a PPI empirically.  Diabetes mellitus, type 2: - Hold metformin and Januvia, start Lantus 5 units plus SSI.  Prostate cancer: - Continue medications.   Code Status:  presumed full Family Communication: friend Disposition Plan: inpatient  Time spent: 80 minutes  Charlynne Cousins [facility] Pager [phone]  **Disclaimer: This note may have been dictated with voice recognition software. Similar sounding words can inadvertently be transcribed and this note may contain transcription errors which may not have been corrected upon publication of note.**

## 2013-11-23 NOTE — ED Provider Notes (Signed)
CSN: [id number]     Arrival date & time 11/23/13  34 History   First MD Initiated Contact with Patient 11/23/13 1344     Chief Complaint  Patient presents with  . low hgb      HPI Pt was seen at 1345.  Per pt and his family, c/o gradual onset and persistence of constant "low Hgb" for an unknown period of time. Pt's family states pt's PMD checked lab tests on Monday and "got called today and told to come to the ER and get admitted for a blood transfusion." Pt states his stools have been "black" for the past several days. Denies CP/palpitations, no SOB/cough, no abd pain, no N/V/D, no back pain.    Past Medical History  Diagnosis Date  . Diabetes mellitus without complication   . Prostate cancer   . Asthma    History reviewed. No pertinent past surgical history.  History  Substance Use Topics  . Smoking status: Never Smoker   . Smokeless tobacco: Not on file  . Alcohol Use: No    Review of Systems ROS: Statement: All systems negative except as marked or noted in the HPI; Constitutional: Negative for fever and chills. ; ; Eyes: Negative for eye pain, redness and discharge. ; ; ENMT: Negative for ear pain, hoarseness, nasal congestion, sinus pressure and sore throat. ; ; Cardiovascular: Negative for chest pain, palpitations, diaphoresis, dyspnea and peripheral edema. ; ; Respiratory: Negative for cough, wheezing and stridor. ; ; Gastrointestinal: +"black stools." Negative for nausea, vomiting, diarrhea, abdominal pain, blood in stool, hematemesis, jaundice and rectal bleeding. . ; ; Genitourinary: Negative for dysuria, flank pain and hematuria. ; ; Musculoskeletal: Negative for back pain and neck pain. Negative for swelling and trauma.; ; Skin: Negative for pruritus, rash, abrasions, blisters, bruising and skin lesion.; ; Neuro: Negative for headache, lightheadedness and neck stiffness. Negative for weakness, altered level of consciousness , altered mental status, extremity weakness,  paresthesias, involuntary movement, seizure and syncope.      Allergies  Review of patient's allergies indicates no known allergies.  Home Medications   Prior to Admission medications   Medication Sig Start Date End Date Taking? Authorizing Provider  albuterol (PROVENTIL HFA;VENTOLIN HFA) 108 (90 BASE) MCG/ACT inhaler Inhale 2 puffs into the lungs every 6 (six) hours as needed for wheezing.   Yes Historical Provider, MD  albuterol (PROVENTIL) (2.5 MG/3ML) 0.083% nebulizer solution Take 2.5 mg by nebulization every 6 (six) hours as needed for wheezing.   Yes Historical Provider, MD  aspirin EC 325 MG tablet Take 325 mg by mouth daily.   Yes Historical Provider, MD  bicalutamide (CASODEX) 50 MG tablet Take 50 mg by mouth daily. 11/16/13  Yes Historical Provider, MD  ferrous sulfate 325 (65 FE) MG tablet Take 325 mg by mouth daily with breakfast.   Yes Historical Provider, MD  Fluticasone-Salmeterol (ADVAIR) 250-50 MCG/DOSE AEPB Inhale 1 puff into the lungs every 12 (twelve) hours.   Yes Historical Provider, MD  metFORMIN (GLUCOPHAGE) 500 MG tablet Take 1,000 mg by mouth 2 (two) times daily with a meal.   Yes Historical Provider, MD  rosuvastatin (CRESTOR) 10 MG tablet Take 10 mg by mouth daily.   Yes Historical Provider, MD  sitaGLIPtin (JANUVIA) 50 MG tablet Take 50 mg by mouth daily.   Yes Historical Provider, MD  tamsulosin (FLOMAX) 0.4 MG CAPS Take 0.4 mg by mouth daily after breakfast.   Yes Historical Provider, MD   BP 123/73  Pulse 63  Temp(Src) 98.1 F (36.7 C) (Oral)  Resp 17  Ht 5\' 9"  (1.753 m)  Wt 197 lb (89.359 kg)  BMI 29.08 kg/m2  SpO2 100%  Orthostatic Vital Signs HP Orthostatic Lying - BP- Lying: 123/74 mmHg ; Pulse- Lying: 66  Orthostatic Sitting - BP- Sitting: 123/73 mmHg ; Pulse- Sitting: 70  Orthostatic Standing at 0 minutes - BP- Standing at 0 minutes: 115/71 mmHg ; Pulse- Standing at 0 minutes: 88    Physical Exam 1350: Physical examination:  Nursing notes  reviewed; Vital signs and O2 SAT reviewed;  Constitutional: Well developed, Well nourished, Well hydrated, In no acute distress; Head:  Normocephalic, atraumatic; Eyes: EOMI, PERRL, No scleral icterus. +conjunctiva pale.; ENMT: Mouth and pharynx normal, Mucous membranes moist; Neck: Supple, Full range of motion, No lymphadenopathy; Cardiovascular: Regular rate and rhythm, No gallop; Respiratory: Breath sounds clear & equal bilaterally, No rales, rhonchi, wheezes.  Speaking full sentences with ease, Normal respiratory effort/excursion; Chest: Nontender, Movement normal; Abdomen: Soft, Nontender, Nondistended, Normal bowel sounds. Rectal exam performed w/permission of pt and ED RN chaperone present.  Anal tone normal.  Non-tender, soft dark brown stool in rectal vault, heme positive.  No fissures, no external hemorrhoids, no palp masses.; Genitourinary: No CVA tenderness; Extremities: Pulses normal, No tenderness, No edema, No calf edema or asymmetry.; Neuro: AA&Ox3, vague historian. Major CN grossly intact.  Speech clear. No gross focal motor or sensory deficits in extremities.; Skin: Color pale, Warm, Dry.   ED Course  Procedures     EKG Interpretation None      MDM  MDM Reviewed: previous chart, nursing note and vitals Reviewed previous: labs Interpretation: labs Total time providing critical care: 30-74 minutes. This excludes time spent performing separately reportable procedures and services. Consults: admitting MD   CRITICAL CARE Performed by: Alfonzo Feller Total critical care time: 35 Critical care time was exclusive of separately billable procedures and treating other patients. Critical care was necessary to treat or prevent imminent or life-threatening deterioration. Critical care was time spent personally by me on the following activities: development of treatment plan with patient and/or surrogate as well as nursing, discussions with consultants, evaluation of patient's  response to treatment, examination of patient, obtaining history from patient or surrogate, ordering and performing treatments and interventions, ordering and review of laboratory studies, ordering and review of radiographic studies, pulse oximetry and re-evaluation of patient's condition.    Results for orders placed during the hospital encounter of 11/23/13  CBC      Result Value Ref Range   WBC 5.3  4.0 - 10.5 K/uL   RBC 3.41 (*) 4.22 - 5.81 MIL/uL   Hemoglobin 7.1 (*) 13.0 - 17.0 g/dL   HCT 25.3 (*) 39.0 - 52.0 %   MCV 74.2 (*) 78.0 - 100.0 fL   MCH 20.8 (*) 26.0 - 34.0 pg   MCHC 28.1 (*) 30.0 - 36.0 g/dL   RDW 20.8 (*) 11.5 - 15.5 %   Platelets 250  150 - 400 K/uL  BASIC METABOLIC PANEL      Result Value Ref Range   Sodium 143  137 - 147 mEq/L   Potassium 4.0  3.7 - 5.3 mEq/L   Chloride 106  96 - 112 mEq/L   CO2 23  19 - 32 mEq/L   Glucose, Bld 195 (*) 70 - 99 mg/dL   BUN 13  6 - 23 mg/dL   Creatinine, Ser 0.58  0.50 - 1.35 mg/dL   Calcium 9.0  8.4 - 10.5  mg/dL   GFR calc non Af Amer >90  >90 mL/min   GFR calc Af Amer >90  >90 mL/min   Anion gap 14  5 - 15  TYPE AND SCREEN      Result Value Ref Range   ABO/RH(D) O POS     Antibody Screen PENDING     Sample Expiration 11/26/2013       1555:  Low Hgb with heme positive stool on exam. PRBC transfusion ordered. Dx and testing d/w pt and family.  Questions answered.  Verb understanding, agreeable to admit.  T/C to [facility] Dr. Aileen Fass, case discussed, including:  HPI, pertinent PM/SHx, VS/PE, dx testing, ED course and treatment:  Agreeable to admit, requests to write temporary orders, obtain medical bed to team 10.     Alfonzo Feller, DO 11/25/13 1438

## 2013-11-24 ENCOUNTER — Encounter (HOSPITAL_COMMUNITY)
Admission: EM | Disposition: A | Discharge status: Home / Self Care | Payer: Self-pay | Source: Home / Self Care | Attending: Internal Medicine

## 2013-11-24 ENCOUNTER — Encounter (HOSPITAL_COMMUNITY): Payer: Self-pay | Admitting: *Deleted

## 2013-11-24 DIAGNOSIS — C169 Malignant neoplasm of stomach, unspecified: Secondary | ICD-10-CM

## 2013-11-24 HISTORY — DX: Malignant neoplasm of stomach, unspecified: C16.9

## 2013-11-24 HISTORY — PX: ESOPHAGOGASTRODUODENOSCOPY: SHX5428

## 2013-11-24 LAB — CBC
HCT: 29.7 % — ABNORMAL LOW (ref 39.0–52.0)
Hemoglobin: 8.7 g/dL — ABNORMAL LOW (ref 13.0–17.0)
MCH: 21.6 pg — AB (ref 26.0–34.0)
MCHC: 29.3 g/dL — ABNORMAL LOW (ref 30.0–36.0)
MCV: 73.9 fL — AB (ref 78.0–100.0)
Platelets: 237 10*3/uL (ref 150–400)
RBC: 4.02 MIL/uL — AB (ref 4.22–5.81)
RDW: 20.4 % — ABNORMAL HIGH (ref 11.5–15.5)
WBC: 6.9 10*3/uL (ref 4.0–10.5)

## 2013-11-24 LAB — TYPE AND SCREEN
ABO/RH(D): O POS
Antibody Screen: NEGATIVE
Unit division: 0
Unit division: 0

## 2013-11-24 LAB — HEMOGLOBIN A1C
Hgb A1c MFr Bld: 6.9 % — ABNORMAL HIGH (ref <5.7)
Mean Plasma Glucose: 151 mg/dL — ABNORMAL HIGH (ref <117)

## 2013-11-24 SURGERY — EGD (ESOPHAGOGASTRODUODENOSCOPY)
Anesthesia: Moderate Sedation | Laterality: Left

## 2013-11-24 MED ORDER — MIDAZOLAM HCL 10 MG/2ML IJ SOLN
INTRAMUSCULAR | Status: DC | PRN
  Administered 2013-11-24: 2 mg via INTRAVENOUS
  Administered 2013-11-24: 1 mg via INTRAVENOUS

## 2013-11-24 MED ORDER — LIDOCAINE VISCOUS 2 % MT SOLN
OROMUCOSAL | Status: DC | PRN
Start: 2013-11-24 — End: 2013-11-24
  Administered 2013-11-24: 10 mL via OROMUCOSAL

## 2013-11-24 MED ORDER — LIDOCAINE VISCOUS 2 % MT SOLN
OROMUCOSAL | Status: AC
  Filled 2013-11-24: qty 15

## 2013-11-24 MED ORDER — FENTANYL CITRATE 0.05 MG/ML IJ SOLN
INTRAMUSCULAR | Status: DC | PRN
  Administered 2013-11-24 (×2): 25 ug via INTRAVENOUS

## 2013-11-24 MED ORDER — MIDAZOLAM HCL 5 MG/ML IJ SOLN
INTRAMUSCULAR | Status: AC
  Filled 2013-11-24: qty 1

## 2013-11-24 MED ORDER — FENTANYL CITRATE 0.05 MG/ML IJ SOLN
INTRAMUSCULAR | Status: AC
  Filled 2013-11-24: qty 2

## 2013-11-24 MED ORDER — SODIUM CHLORIDE 0.9 % IV SOLN
INTRAVENOUS | Status: DC
  Administered 2013-11-24: 20:00:00 via INTRAVENOUS
  Administered 2013-11-24: 500 mL via INTRAVENOUS

## 2013-11-24 NOTE — Progress Notes (Signed)
Inpatient Diabetes Program Recommendations  AACE/ADA: New Consensus Statement on Inpatient Glycemic Control (2013)  Target Ranges:  Prepandial:   less than 140 mg/dL      Peak postprandial:   less than 180 mg/dL (1-2 hours)      Critically ill patients:  140 - 180 mg/dL     Results for Melvin Gardner, Melvin Gardner (MRN [id number]) as of 11/24/2013 06:27  Ref. Range 11/23/2013 14:10  Glucose Latest Range: 70-99 mg/dL 195 (H)    Admit with GIB.  History of DM2.  Home DM Meds: Metformin 1000 mg bid + Januvia 50 mg daily    MD- Please start Novolog Sensitive SSI    Will follow Wyn Quaker RN, MSN, CDE Diabetes Coordinator Inpatient Diabetes Program Team Pager: [phone] (8a-10p)

## 2013-11-24 NOTE — Progress Notes (Signed)
[facility] PROGRESS NOTE  Melvin Gardner ION:[id number] DOB: Oct 14, 1929 DOA: 11/23/2013 PCP: [name]., MD  Assessment/Plan: Acute GI bleeding/ Melena  - He has a history of diverticulosis - s/p 2 units of packed red blood cells.  - GI consulted for endoscopy today - Hold aspirin. Start him on a PPI empirically.  Diabetes mellitus, type 2:  - Hold metformin and Januvia, start Lantus 5 units plus SSI.  Prostate cancer:  - Continue medications.  Code Status: Full Family Communication: Pt in room Disposition Plan: Pending   Consultants:  GI  Procedures:    Antibiotics:  none   HPI/Subjective: No acute events noted overnight  Objective: Filed Vitals:   11/24/13 0016 11/24/13 0229 11/24/13 0530 11/24/13 0956  BP: 156/93 164/85 153/84   Pulse: 64 61 60   Temp: 98.1 F (36.7 C) 98 F (36.7 C) 98 F (36.7 C)   TempSrc: Oral Oral Oral   Resp: 18 18 18    Height:      Weight:      SpO2: 98% 98% 98% 98%    Intake/Output Summary (Last 24 hours) at 11/24/13 1034 Last data filed at 11/24/13 0900  Gross per 24 hour  Intake  415.5 ml  Output    950 ml  Net -534.5 ml   Filed Weights   11/23/13 1329  Weight: 197 lb (89.359 kg)    Exam:   General:  Awake, in nad  Cardiovascular: regular, s1, s2  Respiratory: normal resp effort, no wheezing  Abdomen: soft, nondistended  Musculoskeletal: perfused, no clubbing   Data Reviewed: Basic Metabolic Panel:  Recent Labs Lab 11/23/13 1410  NA 143  K 4.0  CL 106  CO2 23  GLUCOSE 195*  BUN 13  CREATININE 0.58  CALCIUM 9.0   Liver Function Tests: No results found for this basename: AST, ALT, ALKPHOS, BILITOT, PROT, ALBUMIN,  in the last 168 hours No results found for this basename: LIPASE, AMYLASE,  in the last 168 hours No results found for this basename: AMMONIA,  in the last 168 hours CBC:  Recent Labs Lab 11/23/13 1410 11/24/13 0723  WBC 5.3 6.9  HGB 7.1* 8.7*  HCT 25.3* 29.7*   MCV 74.2* 73.9*  PLT 250 237   Cardiac Enzymes: No results found for this basename: CKTOTAL, CKMB, CKMBINDEX, TROPONINI,  in the last 168 hours BNP (last 3 results) No results found for this basename: PROBNP,  in the last 8760 hours CBG: No results found for this basename: GLUCAP,  in the last 168 hours  No results found for this or any previous visit (from the past 240 hour(s)).   Studies: No results found.  Scheduled Meds: . atorvastatin  20 mg Oral q1800  . bicalutamide  50 mg Oral Daily  . ferrous sulfate  325 mg Oral Q breakfast  . mometasone-formoterol  2 puff Inhalation BID  . sodium chloride  3 mL Intravenous Q12H  . tamsulosin  0.4 mg Oral QPC breakfast   Continuous Infusions:   Active Problems:   Acute GI bleeding   Melena   Diabetes mellitus, type 2   Prostate cancer   Acute lower GI bleeding  Time spent: 7min  Chrysten Woulfe, [facility] Pager [phone]. If 7PM-7AM, please contact night-coverage at [email or web address], password [facility] 11/24/2013, 10:34 AM  LOS: 1 day

## 2013-11-24 NOTE — Op Note (Signed)
[facility] [place], [address]   OPERATIVE PROCEDURE REPORT  PATIENT :Melvin Gardner, Melvin Gardner  MR#: [id number] BIRTHDATE :[birth year] GENDER: Male ENDOSCOPIST: Edmonia James, MD ASSISTANT:   Sharon Mt, Endo Technician Cleda Daub, RN CGRN PROCEDURE DATE: 12/10/2013 PRE-PROCEDURE PREPERATION: Patient fasted for 8 hours prior to the procedure. PRE-PROCEDURE PHYSICAL: Patient has stable vital signs.  Neck is supple.  There is no JVD, thyromegaly or LAD.  Chest clear to auscultation.  S1 and S2 regular.  Abdomen soft, non-distended, non-tender with NABS. PROCEDURE:     EGD with cold biopsies x 4. ASA CLASS:     Class III INDICATIONS:     Iron deficiency anemia and melena. MEDICATIONS:     Fentanyl 50 mcg  and Versed 3 mg IV TOPICAL ANESTHETIC:   Viscous Xylocaine-10 cc PO.  DESCRIPTION OF PROCEDURE:   After the risks benefits and alternatives of the procedure were thoroughly explained, informed consent was obtained.  The [place] [id number]  was introduced through the mouth and advanced to the second portion of the duodenum , without limitations. The instrument was slowly withdrawn as the mucosa was fully examined.   The esophagus and the proximal small bowel appeared normal. There were no erosions, masses or polyps noted. Retroflexed views revealed no abnormalities. An antral ulcers with diffuse gastritis [throughout the stomach]was noted; multiple biopsies were done. The scope was then withdrawn from the patient and the procedure terminated. The patient tolerated the procedure without immediate complications.  IMPRESSION:  Diffuse gastritis with a bleeding antral ulcer-biopsies done; otherwise normal esophagogastroduodenoscopy.  RECOMMENDATIONS:     1.  Await pathology results 2.  Avoid NSAIDS for 4 weeks 3.  Anti-reflux regimen to be followed. 4.  Continue current medications and add Carafate suspension 1 gm QID  PO.  REPEAT EXAM:   No recall plnned for now.  DISCHARGE INSTRUCTIONS: standard instructions given. _______________________________ eSigned:  Dr. Edmonia James, MD 12/10/13 1:18 PM   CPT CODES:     [id number], EGD with biopsy  DIAGNOSIS CODES:     280.9 Iron Deficiency 578.1 Anemia Melena   CC: THP  PATIENT NAME:  Melvin Gardner, Melvin Gardner MR#: [id number]

## 2013-11-24 NOTE — H&P (Signed)
Melvin Gardner is an 80 y.o. male.   Chief Complaint: Black stools and weakness.* HPI: 80 year old black male, admittd with a drop in his hemoglobin and genaralized weakness. He denies having any other symptoms except for black stools 3 days PTA. He denies using any NSAIDS either. He has had adenomatous polyps removed in 2002, 2006 and 2011.   Past Medical History  Diagnosis Date  . Prostate cancer   . Asthma   . High cholesterol   . Type II diabetes mellitus   . Anemia   . History of blood transfusion 11/23/2013    related to lower GI bleeding  . Arthritis     "left knee" (11/23/2013)  . COPD (chronic obstructive pulmonary disease)     Melvin Gardner 09/17/2010   Past Surgical History  Procedure Laterality Date  . Tumor excision      "off my spinal cord"  . Hemorrhoid surgery  09/2000    Melvin Gardner 09/30/2010  . Cardiac catheterization      /notes 09/30/2010   Family History  Problem Relation Age of Onset  . Diabetes Mellitus II Mother   . Other Father    Social History:  reports that he has quit smoking. His smoking use included Cigarettes. He has a 5 pack-year smoking history. He has never used smokeless tobacco. He reports that he drinks alcohol. He reports that he uses illicit drugs (Marijuana and Cocaine).  Allergies: No Known Allergies  Medications Prior to Admission  Medication Sig Dispense Refill  . albuterol (PROVENTIL HFA;VENTOLIN HFA) 108 (90 BASE) MCG/ACT inhaler Inhale 2 puffs into the lungs every 6 (six) hours as needed for wheezing.      [name] albuterol (PROVENTIL) (2.5 MG/3ML) 0.083% nebulizer solution Take 2.5 mg by nebulization every 6 (six) hours as needed for wheezing.      [name] aspirin EC 325 MG tablet Take 325 mg by mouth daily.      . bicalutamide (CASODEX) 50 MG tablet Take 50 mg by mouth daily.      . ferrous sulfate 325 (65 FE) MG tablet Take 325 mg by mouth daily with breakfast.      . Fluticasone-Salmeterol (ADVAIR) 250-50 MCG/DOSE AEPB Inhale 1 puff into the lungs every  12 (twelve) hours.      . metFORMIN (GLUCOPHAGE) 500 MG tablet Take 1,000 mg by mouth 2 (two) times daily with a meal.      . rosuvastatin (CRESTOR) 10 MG tablet Take 10 mg by mouth daily.      . sitaGLIPtin (JANUVIA) 50 MG tablet Take 50 mg by mouth daily.      . tamsulosin (FLOMAX) 0.4 MG CAPS Take 0.4 mg by mouth daily after breakfast.       Results for orders placed during the hospital encounter of 11/23/13 (from the past 48 hour(s))  TYPE AND SCREEN     Status: None   Collection Time    11/23/13  2:10 PM      Result Value Ref Range   ABO/RH(D) O POS     Antibody Screen NEG     Sample Expiration 11/26/2013     Unit Number [id number]     Blood Component Type RED CELLS,LR     Unit division 00     Status of Unit ISSUED,FINAL     Transfusion Status OK TO TRANSFUSE     Crossmatch Result Compatible     Unit Number [id number]     Blood Component Type RED CELLS,LR  Unit division 00     Status of Unit ISSUED,FINAL     Transfusion Status OK TO TRANSFUSE     Crossmatch Result Compatible    CBC     Status: Abnormal   Collection Time    11/23/13  2:10 PM      Result Value Ref Range   WBC 5.3  4.0 - 10.5 K/uL   RBC 3.41 (*) 4.22 - 5.81 MIL/uL   Hemoglobin 7.1 (*) 13.0 - 17.0 g/dL   HCT 25.3 (*) 39.0 - 52.0 %   MCV 74.2 (*) 78.0 - 100.0 fL   MCH 20.8 (*) 26.0 - 34.0 pg   MCHC 28.1 (*) 30.0 - 36.0 g/dL   RDW 20.8 (*) 11.5 - 15.5 %   Platelets 250  150 - 400 K/uL  BASIC METABOLIC PANEL     Status: Abnormal   Collection Time    11/23/13  2:10 PM      Result Value Ref Range   Sodium 143  137 - 147 mEq/L   Potassium 4.0  3.7 - 5.3 mEq/L   Chloride 106  96 - 112 mEq/L   CO2 23  19 - 32 mEq/L   Glucose, Bld 195 (*) 70 - 99 mg/dL   BUN 13  6 - 23 mg/dL   Creatinine, Ser 0.58  0.50 - 1.35 mg/dL   Calcium 9.0  8.4 - 10.5 mg/dL   GFR calc non Af Amer >90  >90 mL/min   GFR calc Af Amer >90  >90 mL/min   Comment: (NOTE)     The eGFR has been calculated using the CKD EPI  equation.     This calculation has not been validated in all clinical situations.     eGFR's persistently <90 mL/min signify possible Chronic Kidney     Disease.   Anion gap 14  5 - 15  PREPARE RBC (CROSSMATCH)     Status: None   Collection Time    11/23/13  4:30 PM      Result Value Ref Range   Order Confirmation ORDER PROCESSED BY BLOOD BANK    CBC     Status: Abnormal   Collection Time    11/24/13  7:23 AM      Result Value Ref Range   WBC 6.9  4.0 - 10.5 K/uL   RBC 4.02 (*) 4.22 - 5.81 MIL/uL   Hemoglobin 8.7 (*) 13.0 - 17.0 g/dL   Comment: POST TRANSFUSION SPECIMEN   HCT 29.7 (*) 39.0 - 52.0 %   MCV 73.9 (*) 78.0 - 100.0 fL   MCH 21.6 (*) 26.0 - 34.0 pg   MCHC 29.3 (*) 30.0 - 36.0 g/dL   RDW 20.4 (*) 11.5 - 15.5 %   Platelets 237  150 - 400 K/uL   No results found.  Review of Systems  Constitutional: Positive for malaise/fatigue.  HENT: Negative.   Eyes: Negative.   Respiratory: Negative.   Cardiovascular: Negative.   Gastrointestinal: Positive for melena. Negative for heartburn, nausea and vomiting.  Musculoskeletal: Positive for joint pain.  Skin: Negative.   Neurological: Positive for weakness.  Gardner/Heme/Allergies: Negative.    Blood pressure 153/84, pulse 60, temperature 98 F (36.7 C), temperature source Oral, resp. rate 18, height $RemoveBe'5\' 9"'KSKDyGijH$  (1.753 m), weight 89.359 kg (197 lb), SpO2 98.00%. Physical Exam  Constitutional: He is oriented to person, place, and time. He appears well-developed and well-nourished.  HENT:  Head: Normocephalic and atraumatic.  Eyes: Conjunctivae and EOM are normal. Pupils  are equal, round, and reactive to light.  Neck: Normal range of motion.  Cardiovascular: Normal rate and regular rhythm.   GI: Soft. Bowel sounds are normal.  Musculoskeletal: Normal range of motion.  Neurological: He is alert and oriented to person, place, and time.  Skin: Skin is dry.  Psychiatric: He has a normal mood and affect. His behavior is normal. Thought  content normal.    Assessment/Plan Melena and anemia; proceed with an EGD at this time.   Melvin Gardner 11/24/2013, 12:42 PM

## 2013-11-24 NOTE — Progress Notes (Signed)
UR Completed Jordell Outten Graves-Bigelow, RN,BSN [phone]  

## 2013-11-25 LAB — CBC
HCT: 30 % — ABNORMAL LOW (ref 39.0–52.0)
Hemoglobin: 8.7 g/dL — ABNORMAL LOW (ref 13.0–17.0)
MCH: 21.4 pg — ABNORMAL LOW (ref 26.0–34.0)
MCHC: 29 g/dL — ABNORMAL LOW (ref 30.0–36.0)
MCV: 73.7 fL — ABNORMAL LOW (ref 78.0–100.0)
Platelets: 244 10*3/uL (ref 150–400)
RBC: 4.07 MIL/uL — ABNORMAL LOW (ref 4.22–5.81)
RDW: 20.8 % — ABNORMAL HIGH (ref 11.5–15.5)
WBC: 7.5 10*3/uL (ref 4.0–10.5)

## 2013-11-25 MED ORDER — SUCRALFATE 1 G PO TABS: 1.0000 g | ORAL_TABLET | Freq: Four times a day (QID) | ORAL | Status: DC

## 2013-11-25 MED ORDER — ESOMEPRAZOLE MAGNESIUM 40 MG PO PACK: 40.0000 mg | PACK | Freq: Every day | ORAL | Status: DC

## 2013-11-25 NOTE — Progress Notes (Signed)
Patient discharge teaching given, including activity, diet, follow-up appoints, and medications. Patient verbalized understanding of all discharge instructions. IV access was d/c'd. Vitals are stable. Skin is intact except as charted in most recent assessments. Pt to be escorted out by NT, to be driven home by family. 

## 2013-11-25 NOTE — Discharge Instructions (Signed)
Hold aspirin for 4 weeks after discharge

## 2013-11-25 NOTE — Discharge Summary (Signed)
Physician Discharge Summary  YOUCEF KLAS OZY:[id number] DOB: Sep 08, 1929 DOA: 11/23/2013  PCP: [name]., MD  Admit date: 11/23/2013 Discharge date: 11/25/2013  Time spent: 35 minutes  Recommendations for Outpatient Follow-up:  1. Follow up with PCP in 1-2 weeks  Discharge Diagnoses:  Active Problems:   Acute GI bleeding   Melena   Diabetes mellitus, type 2   Prostate cancer   Acute lower GI bleeding   Discharge Condition: Stable  Diet recommendation: Diabetic  Filed Weights   11/23/13 1329  Weight: 197 lb (89.359 kg)    History of present illness:  Please see admit h and p from 7/9. Briefly, pt presents with black tarry stools with anemia and generalized weakness. Gastroenterology was consulted and the patient was admitted to the floor for further management.  Hospital Course:  Acute GI bleeding/ Melena  - He has a history of diverticulosis  - Pt was given 2 units of packed red blood cells with stabilization of hgb - GI consulted for EGD with findings of gastritis and a bleeding antral ulcer noted with biopsies obtained and pending. - GI recommendations for avoiding NSAIDS for 4 weeks, PPI, and carafate Diabetes mellitus, type 2:  - Held metformin and Januvia, started Lantus 5 units plus SSI while inpatient. Pt will resume home meds on discharge Prostate cancer:  - Continue medications.  Procedures: EGD on 7/10  Consultations:  GI  Discharge Exam: Filed Vitals:   11/24/13 2219 11/24/13 2222 11/25/13 0522 11/25/13 1009  BP: 115/73  133/85   Pulse: 65  73   Temp: 97.8 F (36.6 C)  97.5 F (36.4 C)   TempSrc: Oral  Oral   Resp: 18  18   Height:      Weight:      SpO2: 97% 95% 98% 97%   General: Awake, in nad Cardiovascular: regular, s1, s2 Respiratory: normal resp effort, no wheezing  Discharge Instructions     Medication List    STOP taking these medications       aspirin EC 325 MG tablet      TAKE these medications        albuterol 108 (90 BASE) MCG/ACT inhaler  Commonly known as:  PROVENTIL HFA;VENTOLIN HFA  Inhale 2 puffs into the lungs every 6 (six) hours as needed for wheezing.     albuterol (2.5 MG/3ML) 0.083% nebulizer solution  Commonly known as:  PROVENTIL  Take 2.5 mg by nebulization every 6 (six) hours as needed for wheezing.     bicalutamide 50 MG tablet  Commonly known as:  CASODEX  Take 50 mg by mouth daily.     esomeprazole 40 MG packet  Commonly known as:  NEXIUM  Take 40 mg by mouth daily before breakfast.     ferrous sulfate 325 (65 FE) MG tablet  Take 325 mg by mouth daily with breakfast.     Fluticasone-Salmeterol 250-50 MCG/DOSE Aepb  Commonly known as:  ADVAIR  Inhale 1 puff into the lungs every 12 (twelve) hours.     metFORMIN 500 MG tablet  Commonly known as:  GLUCOPHAGE  Take 1,000 mg by mouth 2 (two) times daily with a meal.     rosuvastatin 10 MG tablet  Commonly known as:  CRESTOR  Take 10 mg by mouth daily.     sitaGLIPtin 50 MG tablet  Commonly known as:  JANUVIA  Take 50 mg by mouth daily.     sucralfate 1 G tablet  Commonly known as:  CARAFATE  Take 1 tablet (1 g total) by mouth 4 (four) times daily.     tamsulosin 0.4 MG Caps capsule  Commonly known as:  FLOMAX  Take 0.4 mg by mouth daily after breakfast.       No Known Allergies Follow-up Information   Follow up with [name]., MD. Schedule an appointment as soon as possible for a visit in 1 week.   Specialty:  Internal Medicine   Contact information:   [address] [place] [address] [phone]        The results of significant diagnostics from this hospitalization (including imaging, microbiology, ancillary and laboratory) are listed below for reference.    Significant Diagnostic Studies: No results found.  Microbiology: No results found for this or any previous visit (from the past 240 hour(s)).   Labs: Basic Metabolic Panel:  Recent Labs Lab  11/23/13 1410  NA 143  K 4.0  CL 106  CO2 23  GLUCOSE 195*  BUN 13  CREATININE 0.58  CALCIUM 9.0   Liver Function Tests: No results found for this basename: AST, ALT, ALKPHOS, BILITOT, PROT, ALBUMIN,  in the last 168 hours No results found for this basename: LIPASE, AMYLASE,  in the last 168 hours No results found for this basename: AMMONIA,  in the last 168 hours CBC:  Recent Labs Lab 11/23/13 1410 11/24/13 0723 11/25/13 0507  WBC 5.3 6.9 7.5  HGB 7.1* 8.7* 8.7*  HCT 25.3* 29.7* 30.0*  MCV 74.2* 73.9* 73.7*  PLT 250 237 244   Cardiac Enzymes: No results found for this basename: CKTOTAL, CKMB, CKMBINDEX, TROPONINI,  in the last 168 hours BNP: BNP (last 3 results) No results found for this basename: PROBNP,  in the last 8760 hours CBG: No results found for this basename: GLUCAP,  in the last 168 hours  Signed:  Ulrick Methot K  [facility] 11/25/2013, 11:18 AM

## 2013-11-27 ENCOUNTER — Encounter (HOSPITAL_COMMUNITY): Payer: Self-pay | Admitting: Gastroenterology

## 2013-12-04 ENCOUNTER — Telehealth: Payer: Self-pay | Admitting: Oncology

## 2013-12-04 NOTE — Telephone Encounter (Signed)
S/W PATIENT AND GAVE NP APPT FOR 07/24 @ 10:30 W/DR. SHADAD.  REFERRING DR. Juanita Craver DX- STOMACH CANCER

## 2013-12-05 ENCOUNTER — Telehealth: Payer: Self-pay | Admitting: Oncology

## 2013-12-05 ENCOUNTER — Other Ambulatory Visit: Payer: Self-pay | Admitting: Oncology

## 2013-12-05 DIAGNOSIS — K922 Gastrointestinal hemorrhage, unspecified: Secondary | ICD-10-CM

## 2013-12-05 NOTE — Telephone Encounter (Signed)
C/D 12/04/13 for appt. 12/08/13

## 2013-12-08 ENCOUNTER — Ambulatory Visit: Payer: Medicare Other

## 2013-12-08 ENCOUNTER — Encounter: Payer: Self-pay | Admitting: Oncology

## 2013-12-08 ENCOUNTER — Other Ambulatory Visit (HOSPITAL_BASED_OUTPATIENT_CLINIC_OR_DEPARTMENT_OTHER): Payer: Medicare Other

## 2013-12-08 ENCOUNTER — Telehealth: Payer: Self-pay | Admitting: Oncology

## 2013-12-08 ENCOUNTER — Ambulatory Visit (HOSPITAL_BASED_OUTPATIENT_CLINIC_OR_DEPARTMENT_OTHER): Payer: Medicare Other | Admitting: Oncology

## 2013-12-08 VITALS — BP 156/87 | HR 88 | Temp 98.2°F | Resp 18 | Ht 69.0 in | Wt 203.4 lb

## 2013-12-08 DIAGNOSIS — K922 Gastrointestinal hemorrhage, unspecified: Secondary | ICD-10-CM

## 2013-12-08 DIAGNOSIS — D500 Iron deficiency anemia secondary to blood loss (chronic): Secondary | ICD-10-CM

## 2013-12-08 DIAGNOSIS — J45909 Unspecified asthma, uncomplicated: Secondary | ICD-10-CM

## 2013-12-08 DIAGNOSIS — J449 Chronic obstructive pulmonary disease, unspecified: Secondary | ICD-10-CM

## 2013-12-08 DIAGNOSIS — C169 Malignant neoplasm of stomach, unspecified: Secondary | ICD-10-CM

## 2013-12-08 DIAGNOSIS — J4489 Other specified chronic obstructive pulmonary disease: Secondary | ICD-10-CM

## 2013-12-08 NOTE — Consult Note (Signed)
Reason for Referral: Gastric cancer.   HPI: 80 year old African American gentleman currently of [place] where he lived the majority of his life. He has a history of asthma and diabetes as well as history of prostate cancer. The details of his prostate cancer is not available to me at this point and we are in the process of obtaining records. Per his report, he was diagnosed over 10 years ago and he is under care of Dr. Janice Norrie. He was treated with definitive radiation therapy at one point and currently on Casodex and Lupron. He is not a great historian but per his report appeared to be under reasonable control from a prostate cancer standpoint. His last PSA was around 3.41 in May of 2015 and it was 8.05 in September of 2014. He did have a bone scan in 2011 which showed metastatic bony disease. He was in his usual state of health when he presented on 11/23/2013 with black tarry stool and generalized weakness. He was hospitalized and received 2 units of packed red cells and an EGD was done at that time. The EGD showed a gastritis and a bleeding antral ulcer with a biopsies confirm the presence of adenocarcinoma. Patient was discharged without any further complications and presents today for a followup.  Clinically, he is asymptomatic at this point. Is not reporting any abdominal pain nausea or vomiting. Has not reported any hemoptysis or hematemesis. Is not reporting any hematochezia and no melena at this time. Is that report any headaches or blurry vision or double vision he has not reported any syncope or falls. His performance status is limited but still ambulating with a cane. He still lives independently and uses public transportations. He does not report any chest pain palpitations orthopnea or PND. Does not report any cough or dyspnea exertion or hemoptysis. He is not reporting frequency urgency or hesitancy. Does not report any skeletal pain back pain or shoulder pain or hip pain or any rib pain. He does  not report any lymphadenopathy or petechiae or bleeding. He has not reported any anxiety or depression. The rest of review of review of systems unremarkable.  Past Medical History  Diagnosis Date  . Prostate cancer   . Asthma   . High cholesterol   . Type II diabetes mellitus   . Anemia   . History of blood transfusion 11/23/2013    related to lower GI bleeding  . Arthritis     "left knee" (11/23/2013)  . COPD (chronic obstructive pulmonary disease)     /notes 09/17/2010  :  Past Surgical History  Procedure Laterality Date  . Tumor excision      "off my spinal cord"  . Hemorrhoid surgery  09/2000    [name] 09/30/2010  . Cardiac catheterization      [name] 09/30/2010  . Esophagogastroduodenoscopy Left 11/24/2013    Procedure: ESOPHAGOGASTRODUODENOSCOPY (EGD);  Surgeon: Juanita Craver, MD;  Location: [facility] ENDOSCOPY;  Service: Endoscopy;  Laterality: Left;  :  Current Outpatient Prescriptions  Medication Sig Dispense Refill  . albuterol (PROVENTIL HFA;VENTOLIN HFA) 108 (90 BASE) MCG/ACT inhaler Inhale 2 puffs into the lungs every 6 (six) hours as needed for wheezing.      [name] albuterol (PROVENTIL) (2.5 MG/3ML) 0.083% nebulizer solution Take 2.5 mg by nebulization every 6 (six) hours as needed for wheezing.      . bicalutamide (CASODEX) 50 MG tablet Take 50 mg by mouth daily.      [name] esomeprazole (NEXIUM) 40 MG packet Take 40 mg by  mouth daily before breakfast.  30 each  0  . ferrous sulfate 325 (65 FE) MG tablet Take 325 mg by mouth daily with breakfast.      . Fluticasone-Salmeterol (ADVAIR) 250-50 MCG/DOSE AEPB Inhale 1 puff into the lungs every 12 (twelve) hours.      [name] latanoprost (XALATAN) 0.005 % ophthalmic solution       . metFORMIN (GLUCOPHAGE) 500 MG tablet Take 1,000 mg by mouth 2 (two) times daily with a meal.      . rosuvastatin (CRESTOR) 10 MG tablet Take 10 mg by mouth daily.      . sitaGLIPtin (JANUVIA) 50 MG tablet Take 50 mg by mouth daily.      . sucralfate (CARAFATE) 1 G tablet  Take 1 tablet (1 g total) by mouth 4 (four) times daily.  120 tablet  0  . tamsulosin (FLOMAX) 0.4 MG CAPS Take 0.4 mg by mouth daily after breakfast.       No current facility-administered medications for this visit.       No Known Allergies:  Family History  Problem Relation Age of Onset  . Diabetes Mellitus II Mother   . Other Father   :  History   Social History  . Marital Status: Widowed    Spouse Name: N/A    Number of Children: N/A  . Years of Education: N/A   Occupational History  . Not on file.   Social History Main Topics  . Smoking status: Former Smoker -- 1.00 packs/day for 5 years    Types: Cigarettes  . Smokeless tobacco: Never Used     Comment: "quit smoking in the 1950's"  . Alcohol Use: Yes     Comment: 11/23/2013 "don't drink anything now; never was a drinker"  . Drug Use: Yes    Special: Marijuana, Cocaine     Comment: "stopped drugs a long long time ago"  . Sexual Activity: No   Other Topics Concern  . Not on file   Social History Narrative  . No narrative on file  :  Pertinent items are noted in HPI.  Exam: ECOG 1 Blood pressure 156/87, pulse 88, temperature 98.2 F (36.8 C), temperature source Oral, resp. rate 18, height 5\' 9"  (1.753 m), weight 203 lb 6.4 oz (92.262 kg). General appearance: alert and cooperative. Slightly pale. Head: Normocephalic, without obvious abnormality Throat: lips, mucosa, and tongue normal; teeth and gums normal Neck: no adenopathy Back: symmetric, no curvature. ROM normal. No CVA tenderness. Resp: clear to auscultation bilaterally Chest wall: no tenderness Cardio: regular rate and rhythm, S1, S2 normal, no murmur, click, rub or gallop GI: soft, non-tender; bowel sounds normal; no masses,  no organomegaly Extremities: extremities normal, atraumatic, no cyanosis or edema Pulses: 2+ and symmetric Skin: Skin color, texture, turgor normal. No rashes or lesions Lymph nodes: Cervical, supraclavicular, and axillary  nodes normal. Neurologic: Grossly normal   Assessment and Plan:   80 year old gentleman with the following issues:  1. Gastric cancer presented with acute GI bleeding and an antral ulcer. He status post an endoscopy and a biopsy but no further staging workup at this time. I explained to him the nature of this diagnosis as well as the natural course. The first of this to stage him I will obtain a PET CT scan at this time as a first step. I explained to them ideally early stage gastric cancer would likely require surgical resection but certainly he is not candidate for that. Given his age, performance status  has other comorbidities including incurable prostate cancer I doubt that curative surgery isn't ideal approach. Alternative therapy would be radiation therapy for palliative purposes. The goal would be to prevent further bleeding from that ulcer. He understands the concept of radiation as he have received for his prostate cancer and willing to proceed with that.  The plan would be to obtain a PET CT scan for staging and now refer him to radiation oncology for an evaluation.  2. Advanced prostate cancer: He appears to be hormone sensitive at this time. He is continuing on Lupron and Casodex per Dr. Janice Norrie and he does have metastatic bony disease. His last PSA was under reasonable control of 3.41 but he is at risk of developing castration resistant disease. When that happens he has limited life expectancy at that time.  3. Anemia: This is related to his GI blood loss. I will followup on his blood counts and iron studies as he may need IV iron infusion in the future.

## 2013-12-08 NOTE — Telephone Encounter (Signed)
Pt confirmed labs/ov per 07/24 POF, called radiation to set up con, gave pt AVS....KJ

## 2013-12-08 NOTE — Progress Notes (Signed)
Please see consult note.  

## 2013-12-08 NOTE — Progress Notes (Signed)
Checked in new patient with no financial issues prior to seeing the dr. He has appt card. °

## 2013-12-13 ENCOUNTER — Other Ambulatory Visit: Payer: Medicare Other

## 2013-12-13 DIAGNOSIS — D500 Iron deficiency anemia secondary to blood loss (chronic): Secondary | ICD-10-CM

## 2013-12-13 DIAGNOSIS — C169 Malignant neoplasm of stomach, unspecified: Secondary | ICD-10-CM

## 2013-12-13 DIAGNOSIS — K922 Gastrointestinal hemorrhage, unspecified: Secondary | ICD-10-CM

## 2013-12-13 LAB — CBC WITH DIFFERENTIAL/PLATELET
BASO%: 0.2 % (ref 0.0–2.0)
Basophils Absolute: 0 10*3/uL (ref 0.0–0.1)
EOS ABS: 0.3 10*3/uL (ref 0.0–0.5)
EOS%: 4.1 % (ref 0.0–7.0)
HEMATOCRIT: 32.4 % — AB (ref 38.4–49.9)
HEMOGLOBIN: 9.5 g/dL — AB (ref 13.0–17.1)
LYMPH%: 11.4 % — ABNORMAL LOW (ref 14.0–49.0)
MCH: 22.4 pg — ABNORMAL LOW (ref 27.2–33.4)
MCHC: 29.3 g/dL — ABNORMAL LOW (ref 32.0–36.0)
MCV: 76.2 fL — AB (ref 79.3–98.0)
MONO#: 0.3 10*3/uL (ref 0.1–0.9)
MONO%: 5 % (ref 0.0–14.0)
NEUT#: 5.1 10*3/uL (ref 1.5–6.5)
NEUT%: 79.3 % — AB (ref 39.0–75.0)
PLATELETS: 348 10*3/uL (ref 140–400)
RBC: 4.25 10*6/uL (ref 4.20–5.82)
RDW: 21.1 % — ABNORMAL HIGH (ref 11.0–14.6)
WBC: 6.4 10*3/uL (ref 4.0–10.3)
lymph#: 0.7 10*3/uL — ABNORMAL LOW (ref 0.9–3.3)

## 2013-12-13 LAB — COMPREHENSIVE METABOLIC PANEL (CC13)
ALT: <6 U/L (ref 0–55)
ANION GAP: 9 meq/L (ref 3–11)
AST: 10 U/L (ref 5–34)
Albumin: 3.6 g/dL (ref 3.5–5.0)
Alkaline Phosphatase: 69 U/L (ref 40–150)
BILIRUBIN TOTAL: 0.46 mg/dL (ref 0.20–1.20)
BUN: 12.3 mg/dL (ref 7.0–26.0)
CO2: 26 meq/L (ref 22–29)
Calcium: 9.3 mg/dL (ref 8.4–10.4)
Chloride: 108 mEq/L (ref 98–109)
Creatinine: 0.7 mg/dL (ref 0.7–1.3)
GLUCOSE: 132 mg/dL (ref 70–140)
Potassium: 3.8 mEq/L (ref 3.5–5.1)
SODIUM: 142 meq/L (ref 136–145)
TOTAL PROTEIN: 6.9 g/dL (ref 6.4–8.3)

## 2013-12-13 LAB — IRON AND TIBC CHCC
%SAT: 7 % — ABNORMAL LOW (ref 20–55)
IRON: 29 ug/dL — AB (ref 42–163)
TIBC: 396 ug/dL (ref 202–409)
UIBC: 367 ug/dL (ref 117–376)

## 2013-12-13 LAB — FERRITIN CHCC: Ferritin: 6 ng/ml — ABNORMAL LOW (ref 22–316)

## 2013-12-19 ENCOUNTER — Encounter: Payer: Self-pay | Admitting: Radiation Oncology

## 2013-12-19 NOTE — Progress Notes (Signed)
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 - NEGATIVE FOR HELICOBACTER PYLORI.Diagnosis NoteThere is diffuse infiltration of the lamina propria by signet ring cells. There is also marked active inflammationand intestinal metaplasia.  Past/Anticipated interventions by surgeon, if any: EGD 11/24/13 with biopsy's  Dr. Juanita Craver , gastritis  Antral ulcer=adenocarcinoma  Past/Anticipated interventions by medical oncology, if any: Dr.Shadad apt 12/08/13   Weight changes, if any: none  Bowel/Bladder complaints, if any: none now  Nausea / Vomiting, if any:no  Pain issues, if any:  Right knee chronic  ankles swelling   SAFETY ISSUES:yes, ambulates with a cane,   Prior radiation? Yes, Prostate= 03/28/2003-05/25/2003 7590 CgY/40 Fractions :Dr.Nigel Goodchild,MD  Pacemaker/ICD? no  Possible current pregnancy? no  Is the patient on methotrexate? no  Current Complaints / other details:  [place] children, Retired,  History Prostate Cancer 10 years ago advanced, continuing Lupron and Casodex, last PSA=43.41; ,Bone scan  2011=metastatic bony disease,  Former cigarette smoker1ppd,5 years, , quit 25's   History  occasional marijuana use Cocaine, ,quit years ago  hx rectal bleeding EGD done7/10/15 hospitalized for black tarry stools, bxs done,  2 units PRBC'S given, type II DM, COPD tumor excision ,hemorrhoid surgery,Cardiac Catheterization,09/30/2010

## 2013-12-20 ENCOUNTER — Encounter: Payer: Self-pay | Admitting: Radiation Oncology

## 2013-12-20 ENCOUNTER — Ambulatory Visit
Admission: RE | Admit: 2013-12-20 | Discharge: 2013-12-20 | Disposition: A | Discharge status: Home / Self Care | Payer: Medicare Other | Source: Ambulatory Visit | Attending: Radiation Oncology | Admitting: Radiation Oncology

## 2013-12-20 VITALS — BP 169/89 | HR 91 | Temp 98.1°F | Resp 20 | Ht 69.0 in | Wt 205.5 lb

## 2013-12-20 DIAGNOSIS — D649 Anemia, unspecified: Secondary | ICD-10-CM | POA: Diagnosis not present

## 2013-12-20 DIAGNOSIS — Z79899 Other long term (current) drug therapy: Secondary | ICD-10-CM | POA: Diagnosis not present

## 2013-12-20 DIAGNOSIS — J45909 Unspecified asthma, uncomplicated: Secondary | ICD-10-CM | POA: Insufficient documentation

## 2013-12-20 DIAGNOSIS — C162 Malignant neoplasm of body of stomach: Secondary | ICD-10-CM

## 2013-12-20 DIAGNOSIS — H409 Unspecified glaucoma: Secondary | ICD-10-CM | POA: Diagnosis not present

## 2013-12-20 DIAGNOSIS — Z510 Encounter for antineoplastic radiation therapy: Secondary | ICD-10-CM | POA: Insufficient documentation

## 2013-12-20 DIAGNOSIS — Z9861 Coronary angioplasty status: Secondary | ICD-10-CM | POA: Diagnosis not present

## 2013-12-20 DIAGNOSIS — E119 Type 2 diabetes mellitus without complications: Secondary | ICD-10-CM | POA: Diagnosis not present

## 2013-12-20 DIAGNOSIS — Z87891 Personal history of nicotine dependence: Secondary | ICD-10-CM | POA: Diagnosis not present

## 2013-12-20 DIAGNOSIS — C163 Malignant neoplasm of pyloric antrum: Secondary | ICD-10-CM | POA: Insufficient documentation

## 2013-12-20 DIAGNOSIS — Z8546 Personal history of malignant neoplasm of prostate: Secondary | ICD-10-CM | POA: Diagnosis not present

## 2013-12-20 DIAGNOSIS — E7800 Pure hypercholesterolemia, unspecified: Secondary | ICD-10-CM | POA: Diagnosis not present

## 2013-12-20 DIAGNOSIS — Z923 Personal history of irradiation: Secondary | ICD-10-CM | POA: Diagnosis not present

## 2013-12-20 DIAGNOSIS — C169 Malignant neoplasm of stomach, unspecified: Secondary | ICD-10-CM | POA: Diagnosis not present

## 2013-12-20 HISTORY — DX: Personal history of irradiation: Z92.3

## 2013-12-20 HISTORY — DX: Malignant neoplasm of stomach, unspecified: C16.9

## 2013-12-20 HISTORY — DX: Unspecified glaucoma: H40.9

## 2013-12-20 NOTE — Addendum Note (Signed)
Encounter addended by: Marye Round, MD on: 12/20/2013  4:27 PM<BR>     Documentation filed: Flowsheet VN, Orders

## 2013-12-20 NOTE — Progress Notes (Signed)
Please see the Nurse Progress Note in the MD Initial Consult Encounter for this patient. 

## 2013-12-20 NOTE — Progress Notes (Signed)
Radiation Oncology         [phone] ________________________________  Name: Melvin Gardner MRN: [id number]  Date: 12/20/2013  DOB: [birth year]  QQ:[id number],QJJHERDE R., MD  [name], MD     REFERRING PHYSICIAN: [name], MD   DIAGNOSIS: The primary encounter diagnosis was Malignant neoplasm of body of stomach. A diagnosis of Malignant neoplasm of pyloric antrum was also pertinent to this visit.   HISTORY OF PRESENT ILLNESS::Melvin Gardner is a 80 y.o. male who is seen for an initial consultation visit. The patient is a very pleasant gentleman who is seen today regarding his new diagnosis of gastric cancer. The patient notably has a history of metastatic prostate cancer, currently undergoing hormone ablation. A bone scan in 2011 did confirm bony metastatic disease. The patient did receive definitive radiation treatment in our clinic which was completed in 2005.  The patient recently was noted to have iron deficiency anemia and melena. He underwent an upper endoscopy and this revealed diffuse gastritis with a bleeding antral ulcer. Multiple biopsies were obtained. Final pathology revealed a poorly differentiated adenocarcinoma with signet ring cells.  The patient at this time indicates that he is doing very well. He really denies any fatigue and denies any current pain including bony pain. The patient has seen Dr. [name] and medical oncology who discussed further workup with the patient including a PET scan. I have been asked to see the patient today regarding possible radiation treatment. The patient given his comorbidities is not felt to be a good candidate for surgical resection. He expressed his strong opinion today he did not wish to proceed with surgery in any event.   PREVIOUS RADIATION THERAPY: Yes definitive radiation treatment for prostate cancer completed in 2005 corresponding to 8 weeks of external beam radiation treatment   PAST MEDICAL HISTORY:  has a past  medical history of Asthma; High cholesterol; Type II diabetes mellitus; Anemia; Arthritis; COPD (chronic obstructive pulmonary disease); Prostate cancer (06/03/2001); Stomach cancer (11/24/13); History of blood transfusion (11/23/2013); History of radiation therapy (03/28/2003-05/25/2003); and Glaucoma.     PAST SURGICAL HISTORY: Past Surgical History  Procedure Laterality Date  . Tumor excision  10 years ago    "off my spinal cord"  . Hemorrhoid surgery  09/2000    [name] 09/30/2010  . Cardiac catheterization      [name] 09/30/2010  . Esophagogastroduodenoscopy Left 11/24/2013    Procedure: ESOPHAGOGASTRODUODENOSCOPY (EGD);  Surgeon: Juanita Craver, MD;  Location: [facility] ENDOSCOPY;  Service: Endoscopy;  Laterality: Left;     FAMILY HISTORY: family history includes Diabetes Mellitus II in his mother; Other in his father.   SOCIAL HISTORY:  reports that he has quit smoking. His smoking use included Cigarettes. He has a 5 pack-year smoking history. He has never used smokeless tobacco. He reports that he does not drink alcohol or use illicit drugs.   ALLERGIES: Review of patient's allergies indicates no known allergies.   MEDICATIONS:  Current Outpatient Prescriptions  Medication Sig Dispense Refill  . albuterol (PROVENTIL HFA;VENTOLIN HFA) 108 (90 BASE) MCG/ACT inhaler Inhale 2 puffs into the lungs every 6 (six) hours as needed for wheezing.      [name] albuterol (PROVENTIL) (2.5 MG/3ML) 0.083% nebulizer solution Take 2.5 mg by nebulization every 6 (six) hours as needed for wheezing.      . bicalutamide (CASODEX) 50 MG tablet Take 50 mg by mouth daily.      [name] esomeprazole (NEXIUM) 40 MG packet Take 40 mg by mouth daily  before breakfast.  30 each  0  . ferrous sulfate 325 (65 FE) MG tablet Take 325 mg by mouth daily with breakfast.      . Fluticasone-Salmeterol (ADVAIR) 250-50 MCG/DOSE AEPB Inhale 1 puff into the lungs every 12 (twelve) hours.      [name] latanoprost (XALATAN) 0.005 % ophthalmic solution         . metFORMIN (GLUCOPHAGE) 500 MG tablet Take 1,000 mg by mouth 2 (two) times daily with a meal.      . rosuvastatin (CRESTOR) 10 MG tablet Take 10 mg by mouth daily.      . sitaGLIPtin (JANUVIA) 50 MG tablet Take 50 mg by mouth daily.      . sucralfate (CARAFATE) 1 G tablet Take 1 tablet (1 g total) by mouth 4 (four) times daily.  120 tablet  0  . tamsulosin (FLOMAX) 0.4 MG CAPS Take 0.4 mg by mouth daily after breakfast.       No current facility-administered medications for this encounter.     REVIEW OF SYSTEMS:  A 15 point review of systems is documented in the electronic medical record. This was obtained by the nursing staff. However, I reviewed this with the patient to discuss relevant findings and make appropriate changes.  Pertinent items are noted in HPI.    PHYSICAL EXAM:  height is 5\' 9"  (1.753 m) and weight is 205 lb 8 oz (93.214 kg). His oral temperature is 98.1 F (36.7 C). His blood pressure is 169/89 and his pulse is 91. His respiration is 20.   ECOG = 1  0 - Asymptomatic (Fully active, able to carry on all predisease activities without restriction)  1 - Symptomatic but completely ambulatory (Restricted in physically strenuous activity but ambulatory and able to carry out work of a light or sedentary nature. For example, light housework, office work)  2 - Symptomatic, <50% in bed during the day (Ambulatory and capable of all self care but unable to carry out any work activities. Up and about more than 50% of waking hours)  3 - Symptomatic, >50% in bed, but not bedbound (Capable of only limited self-care, confined to bed or chair 50% or more of waking hours)  4 - Bedbound (Completely disabled. Cannot carry on any self-care. Totally confined to bed or chair)  5 - Death   Eustace Pen MM, Creech RH, Tormey DC, et al. [phone]). "Toxicity and response criteria of the [facility]". [place] Oncol. 5 (6): 649-55  General: Well-developed, in no acute  distress HEENT: Normocephalic, atraumatic; oral cavity clear Neck: Supple without any lymphadenopathy Cardiovascular: Regular rate and rhythm Respiratory: Clear to auscultation bilaterally GI: Soft, nontender, normal bowel sounds Extremities: No edema present Neuro: No focal deficits     LABORATORY DATA:  Lab Results  Component Value Date   WBC 6.4 12/13/2013   HGB 9.5* 12/13/2013   HCT 32.4* 12/13/2013   MCV 76.2* 12/13/2013   PLT 348 12/13/2013   Lab Results  Component Value Date   NA 142 12/13/2013   K 3.8 12/13/2013   CL 106 11/23/2013   CO2 26 12/13/2013   Lab Results  Component Value Date   ALT <6 12/13/2013   AST 10 12/13/2013   ALKPHOS 69 12/13/2013   BILITOT 0.46 12/13/2013      RADIOGRAPHY: No results found.     IMPRESSION: The patient has a new diagnosis of gastric cancer involving the antrum. Staging is pending without any recent imaging studies available for  review at this time. The patient is going to proceed with a PET scan which will help characterize the state of the patient's disease in terms of regional and distant disease. This may also help to clarify the state of his bony metastatic disease related to prostate cancer.  I discussed with the patient my feeling at this time based on the available information that radiation treatment likely will be of benefit for him. Again, the patient does not wish to proceed with surgery or consider this option. The appropriate goal of treatment and length of radiation treatment may be impacted by his upcoming PET scan. I discussed the potential benefit of radiation treatment in terms of helping with GI bleeding and also improving local control.  The patient does have significant comorbidities. As we proceed, we can get an opinion from Dr. [name] regarding whether he believes that the patient would tolerate any chemotherapy such as Xeloda which could be used for radiosensitization.   PLAN: I will have the patient return to our  clinic in the near future after undergoing a PET scan for initial staging purposes.       ________________________________   Jodelle Gross, MD, PhD   **Disclaimer: This note was dictated with voice recognition software. Similar sounding words can inadvertently be transcribed and this note may contain transcription errors which may not have been corrected upon publication of note.**

## 2013-12-21 ENCOUNTER — Telehealth: Payer: Self-pay | Admitting: *Deleted

## 2013-12-21 NOTE — Telephone Encounter (Signed)
CALLED PATIENT TO INFORM OF TEST FOR 12-26-13 AND HIS [place] VISIT ON 12-27-13 - ARRIVAL TIME - 2:30 PM, SPOKE WITH PATIENT AND HE IS AWARE OF THIS TEST AND THIS [facility] VISIT

## 2013-12-26 ENCOUNTER — Ambulatory Visit (HOSPITAL_COMMUNITY)
Admission: RE | Admit: 2013-12-26 | Discharge: 2013-12-26 | Disposition: A | Discharge status: Home / Self Care | Payer: Medicare Other | Source: Ambulatory Visit | Attending: Diagnostic Radiology | Admitting: Diagnostic Radiology

## 2013-12-26 DIAGNOSIS — M949 Disorder of cartilage, unspecified: Secondary | ICD-10-CM

## 2013-12-26 DIAGNOSIS — C163 Malignant neoplasm of pyloric antrum: Secondary | ICD-10-CM | POA: Diagnosis not present

## 2013-12-26 DIAGNOSIS — Z85830 Personal history of malignant neoplasm of bone: Secondary | ICD-10-CM | POA: Diagnosis not present

## 2013-12-26 DIAGNOSIS — Z8546 Personal history of malignant neoplasm of prostate: Secondary | ICD-10-CM | POA: Diagnosis not present

## 2013-12-26 DIAGNOSIS — C162 Malignant neoplasm of body of stomach: Secondary | ICD-10-CM | POA: Insufficient documentation

## 2013-12-26 DIAGNOSIS — M899 Disorder of bone, unspecified: Secondary | ICD-10-CM | POA: Insufficient documentation

## 2013-12-26 LAB — GLUCOSE, CAPILLARY: Glucose-Capillary: 130 mg/dL — ABNORMAL HIGH (ref 70–99)

## 2013-12-26 MED ORDER — FLUDEOXYGLUCOSE F - 18 (FDG) INJECTION
10.0000 | Freq: Once | INTRAVENOUS | Status: AC | PRN
  Administered 2013-12-26: 10 via INTRAVENOUS

## 2013-12-27 ENCOUNTER — Ambulatory Visit
Admission: RE | Admit: 2013-12-27 | Discharge: 2013-12-27 | Disposition: A | Discharge status: Home / Self Care | Payer: Medicare Other | Source: Ambulatory Visit | Attending: Radiation Oncology | Admitting: Radiation Oncology

## 2013-12-27 ENCOUNTER — Encounter: Payer: Self-pay | Admitting: Radiation Oncology

## 2013-12-27 VITALS — BP 129/78 | HR 82 | Temp 97.5°F | Resp 20 | Ht 69.0 in | Wt 203.0 lb

## 2013-12-27 DIAGNOSIS — C7951 Secondary malignant neoplasm of bone: Secondary | ICD-10-CM | POA: Insufficient documentation

## 2013-12-27 DIAGNOSIS — C7952 Secondary malignant neoplasm of bone marrow: Principal | ICD-10-CM

## 2013-12-27 DIAGNOSIS — C163 Malignant neoplasm of pyloric antrum: Secondary | ICD-10-CM | POA: Insufficient documentation

## 2013-12-27 DIAGNOSIS — M899 Disorder of bone, unspecified: Secondary | ICD-10-CM | POA: Diagnosis not present

## 2013-12-27 DIAGNOSIS — M949 Disorder of cartilage, unspecified: Secondary | ICD-10-CM

## 2013-12-27 DIAGNOSIS — C7982 Secondary malignant neoplasm of genital organs: Secondary | ICD-10-CM | POA: Diagnosis not present

## 2013-12-27 NOTE — Progress Notes (Signed)
Please see the Nurse Progress Note in the MD Initial Consult Encounter for this patient. 

## 2013-12-27 NOTE — Progress Notes (Signed)
Follow up new consult had Pet scan yesterday,here to discuss results, patient given w/c at elevator, ambulating slow with a cane,but weak, yellow fall risk  band on, patient denies pain, no nausea, energy level good stated,  Pet Scan 12/26/13: SKELETON  There is diffuse sclerotic metastasis without metabolic activity  throughout the spine and pelvis consists with treated metastasis.  However, there is a hypermetabolic lesion within the T8 vertebral  body with SUV max equal 5.3. There is no corresponding lesion on the  CT portion exam. Additionally there is a hypermetabolic focus  associated with the left lateral fifth rib (image 73).  IMPRESSION:  1. No abnormal metabolic activity in the stomach.  2. No evidence of local nodal metastasis or liver metastasis.  3. Hypermetabolic lesion the T8 vertebral body is consistent with  metastatic lesion. Would have to favor prostate cancer metastasis  over gastric skeletal metastasis. Recommend correlation with PSA.  4. Metabolic activity associated with a of the lateral aspect of the  left fifth rib is also concerning for metastasis.  5. Widespread sclerotic skeletal metastasis without metabolic  activity consistent with inactive / treated skeletal metastasis.

## 2013-12-29 NOTE — Progress Notes (Signed)
Radiation Oncology         [phone] ________________________________  Name: Melvin Gardner MRN: [id number]  Date: 12/27/2013  DOB: Oct 19, 1929  Follow-Up Visit Note  CC: [name]., MD  [name], MD  Diagnosis:   Adenocarcinoma of the gastric antrum   Narrative:  The patient returns today for routine follow-up.  The patient was seen recently regarding his new diagnosis of gastric carcinoma. I recommended for the patient to proceed with a PET scan. He states he is done well recently with no new complaints. The PET scan was completed on 12/26/2013. No abnormal metabolic activity was seen within the stomach. No evidence of local nodal metastasis or liver metastasis. The patient does have a history of metastatic prostate cancer. A hypermetabolic lesion was seen within the T8 vertebral body and there also was some hypermetabolic activity within the left rib. Widespread sclerotic skeletal metastasis was seen.                              ALLERGIES:  has No Known Allergies.  Meds: Current Outpatient Prescriptions  Medication Sig Dispense Refill  . albuterol (PROVENTIL HFA;VENTOLIN HFA) 108 (90 BASE) MCG/ACT inhaler Inhale 2 puffs into the lungs every 6 (six) hours as needed for wheezing.      [name] albuterol (PROVENTIL) (2.5 MG/3ML) 0.083% nebulizer solution Take 2.5 mg by nebulization every 6 (six) hours as needed for wheezing.      . bicalutamide (CASODEX) 50 MG tablet Take 50 mg by mouth daily.      [name] esomeprazole (NEXIUM) 40 MG packet Take 40 mg by mouth daily before breakfast.  30 each  0  . ferrous sulfate 325 (65 FE) MG tablet Take 325 mg by mouth daily with breakfast.      . Fluticasone-Salmeterol (ADVAIR) 250-50 MCG/DOSE AEPB Inhale 1 puff into the lungs every 12 (twelve) hours.      [name] latanoprost (XALATAN) 0.005 % ophthalmic solution       . metFORMIN (GLUCOPHAGE) 500 MG tablet Take 1,000 mg by mouth 2 (two) times daily with a meal.      . rosuvastatin (CRESTOR) 10 MG  tablet Take 10 mg by mouth daily.      . sitaGLIPtin (JANUVIA) 50 MG tablet Take 50 mg by mouth daily.      . sucralfate (CARAFATE) 1 G tablet Take 1 tablet (1 g total) by mouth 4 (four) times daily.  120 tablet  0  . tamsulosin (FLOMAX) 0.4 MG CAPS Take 0.4 mg by mouth daily after breakfast.       No current facility-administered medications for this encounter.    Physical Findings: The patient is in no acute distress. Patient is alert and oriented.  height is 5\' 9"  (1.753 m) and weight is 203 lb (92.08 kg). His oral temperature is 97.5 F (36.4 C). His blood pressure is 129/78 and his pulse is 82. His respiration is 20 and oxygen saturation is 97%. .     Lab Findings: Lab Results  Component Value Date   WBC 6.4 12/13/2013   HGB 9.5* 12/13/2013   HCT 32.4* 12/13/2013   MCV 76.2* 12/13/2013   PLT 348 12/13/2013     Radiographic Findings: Nm Pet Image Initial (pi) Skull Base To Thigh  12/26/2013   CLINICAL DATA:  Initial treatment strategy for gastric carcinoma. Prior history of prostate carcinoma.  EXAM: NUCLEAR MEDICINE PET SKULL BASE TO THIGH  TECHNIQUE: 10  mCi F-18 FDG was injected intravenously. Full-ring PET imaging was performed from the skull base to thigh after the radiotracer. CT data was obtained and used for attenuation correction and anatomic localization.  FASTING BLOOD GLUCOSE:  Value: 130 mg/dl  COMPARISON:  Bone scan 01/16/2010  FINDINGS: NECK  No hypermetabolic lymph nodes in the neck.  CHEST  No hypermetabolic mediastinal or hilar nodes. No suspicious pulmonary nodules on the CT scan.  ABDOMEN/PELVIS  There is no hypermetabolic activity within the stomach. No hypermetabolic gastro hepatic ligament lymph nodes. No abnormal metabolic liver. No hypermetabolic abdominal pelvic lymph nodes.  SKELETON  There is diffuse sclerotic metastasis without metabolic activity throughout the spine and pelvis consists with treated metastasis. However, there is a hypermetabolic lesion within the  T8 vertebral body with SUV max equal 5.3. There is no corresponding lesion on the CT portion exam. Additionally there is a hypermetabolic focus associated with the left lateral fifth rib (image 73).  IMPRESSION: 1. No abnormal metabolic activity in the stomach. 2. No evidence of local nodal metastasis or liver metastasis. 3. Hypermetabolic lesion the T8 vertebral body is consistent with metastatic lesion. Would have to favor prostate cancer metastasis over gastric skeletal metastasis. Recommend correlation with PSA. 4. Metabolic activity associated with a of the lateral aspect of the left fifth rib is also concerning for metastasis. 5. Widespread sclerotic skeletal metastasis without metabolic activity consistent with inactive / treated skeletal metastasis.   Electronically Signed   By: Suzy Bouchard M.D.   On: 12/26/2013 16:00    Impression:    The patient's PET scan really did not show any hypermetabolic activity associated with the drastic tumor or regional disease. It is most likely that the skeletal metastasis is related to his known metastatic prostate cancer. His PSA level most recently has been fairly low, on anti-hormonal treatment.  I discussed with the patient that this was good news. He is not felt to be a good candidate for surgery and therefore limited disease within the stomach is ideal I believe to have a reasonable chance for long-term local control with  radiation treatment alone which can be given with concurrent chemotherapy. No activity was seen within the stomach but the patient does have biopsy-proven disease within the gastric antrum.  Plan:  I believe it would be reasonable to proceed with planning for radiation treatment. He does wish to proceed with a definitive, full course of radiation treatment. He'll be scheduled for a simulation next week. I would also like to discuss the patient's case further in multidisciplinary GI conference. The patient is also scheduled to see Dr.  [name] within the next couple of weeks in medical oncology and we will see what his opinion is regarding possible concurrent chemotherapy.  I spent 15 minutes with the patient today, the majority of which was spent counseling the patient on the diagnosis of cancer and coordinating care.   Jodelle Gross, M.D., Ph.D.

## 2014-01-05 ENCOUNTER — Ambulatory Visit
Admission: RE | Admit: 2014-01-05 | Discharge: 2014-01-05 | Disposition: A | Discharge status: Home / Self Care | Payer: Medicare Other | Source: Ambulatory Visit | Attending: Radiation Oncology | Admitting: Radiation Oncology

## 2014-01-05 DIAGNOSIS — C163 Malignant neoplasm of pyloric antrum: Secondary | ICD-10-CM

## 2014-01-05 DIAGNOSIS — Z510 Encounter for antineoplastic radiation therapy: Secondary | ICD-10-CM | POA: Diagnosis not present

## 2014-01-09 ENCOUNTER — Other Ambulatory Visit: Payer: Self-pay | Admitting: Oncology

## 2014-01-09 DIAGNOSIS — K922 Gastrointestinal hemorrhage, unspecified: Secondary | ICD-10-CM

## 2014-01-10 ENCOUNTER — Telehealth: Payer: Self-pay | Admitting: Oncology

## 2014-01-10 ENCOUNTER — Ambulatory Visit (HOSPITAL_BASED_OUTPATIENT_CLINIC_OR_DEPARTMENT_OTHER): Payer: Medicare Other | Admitting: Oncology

## 2014-01-10 ENCOUNTER — Other Ambulatory Visit (HOSPITAL_BASED_OUTPATIENT_CLINIC_OR_DEPARTMENT_OTHER): Payer: Medicare Other

## 2014-01-10 ENCOUNTER — Encounter: Payer: Self-pay | Admitting: Oncology

## 2014-01-10 VITALS — BP 132/79 | HR 83 | Temp 98.1°F | Resp 18 | Ht 69.0 in | Wt 196.9 lb

## 2014-01-10 DIAGNOSIS — D500 Iron deficiency anemia secondary to blood loss (chronic): Secondary | ICD-10-CM

## 2014-01-10 DIAGNOSIS — C7952 Secondary malignant neoplasm of bone marrow: Secondary | ICD-10-CM

## 2014-01-10 DIAGNOSIS — C169 Malignant neoplasm of stomach, unspecified: Secondary | ICD-10-CM

## 2014-01-10 DIAGNOSIS — C61 Malignant neoplasm of prostate: Secondary | ICD-10-CM

## 2014-01-10 DIAGNOSIS — C7951 Secondary malignant neoplasm of bone: Secondary | ICD-10-CM

## 2014-01-10 DIAGNOSIS — K922 Gastrointestinal hemorrhage, unspecified: Secondary | ICD-10-CM

## 2014-01-10 LAB — CBC WITH DIFFERENTIAL/PLATELET
BASO%: 0.4 % (ref 0.0–2.0)
BASOS ABS: 0 10*3/uL (ref 0.0–0.1)
EOS ABS: 0.3 10*3/uL (ref 0.0–0.5)
EOS%: 4.3 % (ref 0.0–7.0)
HCT: 33.5 % — ABNORMAL LOW (ref 38.4–49.9)
HEMOGLOBIN: 10 g/dL — AB (ref 13.0–17.1)
LYMPH%: 11.8 % — ABNORMAL LOW (ref 14.0–49.0)
MCH: 23.4 pg — AB (ref 27.2–33.4)
MCHC: 30 g/dL — ABNORMAL LOW (ref 32.0–36.0)
MCV: 78 fL — ABNORMAL LOW (ref 79.3–98.0)
MONO#: 0.5 10*3/uL (ref 0.1–0.9)
MONO%: 8.4 % (ref 0.0–14.0)
NEUT%: 75.1 % — ABNORMAL HIGH (ref 39.0–75.0)
NEUTROS ABS: 4.6 10*3/uL (ref 1.5–6.5)
PLATELETS: 255 10*3/uL (ref 140–400)
RBC: 4.3 10*6/uL (ref 4.20–5.82)
RDW: 22.7 % — ABNORMAL HIGH (ref 11.0–14.6)
WBC: 6.2 10*3/uL (ref 4.0–10.3)
lymph#: 0.7 10*3/uL — ABNORMAL LOW (ref 0.9–3.3)

## 2014-01-10 LAB — HOLD TUBE, BLOOD BANK

## 2014-01-10 NOTE — Telephone Encounter (Signed)
Pt confirmed labs/ov per 08/26 POF, gave pt AVS....KJ °

## 2014-01-10 NOTE — Progress Notes (Signed)
Hematology and Oncology Follow Up Visit  Melvin Gardner [id number] 1929/07/13 80 y.o. 01/10/2014 12:19 PM Melvin Gardner., MDShadad, Mathis Dad, MD   Principle Diagnosis: 65 year old gentleman with the following diagnoses:  1. Advanced prostate cancer hormone sensitive at this time he sees androgen depravation with Lupron and Casodex under the care of Dr. Janice Norrie.   2. Gastric cancer diagnosed in July of 2015 presented with acute GI bleed from an antral ulcer. His disease is localized to the stomach without any evidence of metastasis.  Current therapy: Radiation therapy for palliative purposes for his gastric cancer. Is not a candidate for surgical resection.  Interim History:  Melvin Gardner presents today for a followup visit. He is a pleasant gentleman with the above diagnosis. Since his last visit, he reports no new symptoms related to his cancer. He does not report any abdominal pain or discomfort. He does not report any hematochezia or melena. He still have some discoloration of his stool but no bleeding. He does report neck pain which is muscular in nature. He is not report any headaches or blurry vision or syncope. His performance status is limited but unchanged. He still lives independently and uses public transportations. He does not report any chest pain palpitations orthopnea or PND. Does not report any cough or dyspnea exertion or hemoptysis. He is not reporting frequency urgency or hesitancy. Does not report any skeletal pain back pain or shoulder pain or hip pain or any rib pain. He does not report any lymphadenopathy or petechiae or bleeding. He has not reported any anxiety or depression. The rest of review of review of systems unremarkable.    Medications: I have reviewed the patient's current medications.  Current Outpatient Prescriptions  Medication Sig Dispense Refill  . albuterol (PROVENTIL HFA;VENTOLIN HFA) 108 (90 BASE) MCG/ACT inhaler Inhale 2 puffs into the lungs every 6 (six)  hours as needed for wheezing.      [name] albuterol (PROVENTIL) (2.5 MG/3ML) 0.083% nebulizer solution Take 2.5 mg by nebulization every 6 (six) hours as needed for wheezing.      . bicalutamide (CASODEX) 50 MG tablet Take 50 mg by mouth daily.      [name] esomeprazole (NEXIUM) 40 MG packet Take 40 mg by mouth daily before breakfast.  30 each  0  . ferrous sulfate 325 (65 FE) MG tablet Take 325 mg by mouth daily with breakfast.      . Fluticasone-Salmeterol (ADVAIR) 250-50 MCG/DOSE AEPB Inhale 1 puff into the lungs every 12 (twelve) hours.      [name] latanoprost (XALATAN) 0.005 % ophthalmic solution       . metFORMIN (GLUCOPHAGE) 500 MG tablet Take 1,000 mg by mouth 2 (two) times daily with a meal.      . rosuvastatin (CRESTOR) 10 MG tablet Take 10 mg by mouth daily.      . sitaGLIPtin (JANUVIA) 50 MG tablet Take 50 mg by mouth daily.      . sucralfate (CARAFATE) 1 G tablet Take 1 tablet (1 g total) by mouth 4 (four) times daily.  120 tablet  0  . tamsulosin (FLOMAX) 0.4 MG CAPS Take 0.4 mg by mouth daily after breakfast.       No current facility-administered medications for this visit.     Allergies: No Known Allergies  Past Medical History, Surgical history, Social history, and Family History were reviewed and updated.  Physical Exam: Blood pressure 132/79, pulse 83, temperature 98.1 F (36.7 C), temperature source Oral, resp. rate 18, height 5'  9" (1.753 m), weight 196 lb 14.4 oz (89.313 kg), SpO2 98.00%. ECOG: 1 General appearance: alert and cooperative Head: Normocephalic, without obvious abnormality Neck: no adenopathy Lymph nodes: Cervical, supraclavicular, and axillary nodes normal. Heart:regular rate and rhythm, S1, S2 normal, no murmur, click, rub or gallop Lung:chest clear, no wheezing, rales, normal symmetric air entry Abdomin: soft, non-tender, without masses or organomegaly EXT:no erythema, induration, or nodules   Lab Results: Lab Results  Component Value Date   WBC 6.2  01/10/2014   HGB 10.0* 01/10/2014   HCT 33.5* 01/10/2014   MCV 78.0* 01/10/2014   PLT 255 01/10/2014     Chemistry      Component Value Date/Time   NA 142 12/13/2013 1107   NA 143 11/23/2013 1410   K 3.8 12/13/2013 1107   K 4.0 11/23/2013 1410   CL 106 11/23/2013 1410   CO2 26 12/13/2013 1107   CO2 23 11/23/2013 1410   BUN 12.3 12/13/2013 1107   BUN 13 11/23/2013 1410   CREATININE 0.7 12/13/2013 1107   CREATININE 0.58 11/23/2013 1410      Component Value Date/Time   CALCIUM 9.3 12/13/2013 1107   CALCIUM 9.0 11/23/2013 1410   ALKPHOS 69 12/13/2013 1107   ALKPHOS 71 01/23/2007 0811   AST 10 12/13/2013 1107   AST 18 01/23/2007 0811   ALT <6 12/13/2013 1107   ALT 17 01/23/2007 0811   BILITOT 0.46 12/13/2013 1107   BILITOT 1.0 01/23/2007 0811     EXAM:  NUCLEAR MEDICINE PET SKULL BASE TO THIGH  TECHNIQUE:  10 mCi F-18 FDG was injected intravenously. Full-ring PET imaging  was performed from the skull base to thigh after the radiotracer. CT  data was obtained and used for attenuation correction and anatomic  localization.  FASTING BLOOD GLUCOSE: Value: 130 mg/dl  COMPARISON: Bone scan 01/16/2010  FINDINGS:  NECK  No hypermetabolic lymph nodes in the neck.  CHEST  No hypermetabolic mediastinal or hilar nodes. No suspicious  pulmonary nodules on the CT scan.  ABDOMEN/PELVIS  There is no hypermetabolic activity within the stomach. No  hypermetabolic gastro hepatic ligament lymph nodes. No abnormal  metabolic liver. No hypermetabolic abdominal pelvic lymph nodes.  SKELETON  There is diffuse sclerotic metastasis without metabolic activity  throughout the spine and pelvis consists with treated metastasis.  However, there is a hypermetabolic lesion within the T8 vertebral  body with SUV max equal 5.3. There is no corresponding lesion on the  CT portion exam. Additionally there is a hypermetabolic focus  associated with the left lateral fifth rib (image 73).  IMPRESSION:  1. No abnormal metabolic activity  in the stomach.  2. No evidence of local nodal metastasis or liver metastasis.  3. Hypermetabolic lesion the T8 vertebral body is consistent with  metastatic lesion. Would have to favor prostate cancer metastasis  over gastric skeletal metastasis. Recommend correlation with PSA.  4. Metabolic activity associated with a of the lateral aspect of the  left fifth rib is also concerning for metastasis.  5. Widespread sclerotic skeletal metastasis without metabolic  activity consistent with inactive / treated skeletal metastasis.  Impression and Plan:  80 year old gentleman with the following issues:  1. Gastric cancer presented with acute GI bleeding and an antral ulcer. He status post an endoscopy and a biopsy. His PET CT scan obtained on 12/26/2013 was discussed today. It did not show any evidence of lymphadenopathy or disease outside of his stomach. There is some metabolic activity in the left rib and  vertebral body likely related to his prostate cancer. He is a candidate for any surgical resection and receiving palliative radiation therapy in the near future. We will continue to follow him clinically after that.  2. Advanced prostate cancer: He appears to be hormone sensitive at this time. He is continuing on Lupron and Casodex per Dr. Janice Norrie and he does have metastatic bony disease. His last PSA was under reasonable control of 3.41 but he is at risk of developing castration resistant disease. No need to change any therapy at this time.  3. Anemia: This is related to his GI blood loss. His hemoglobin has stabilized and has not required transfusions.          [facility], MD 8/26/201512:19 PM

## 2014-01-12 ENCOUNTER — Ambulatory Visit
Admission: RE | Admit: 2014-01-12 | Discharge: 2014-01-12 | Disposition: A | Discharge status: Home / Self Care | Payer: Medicare Other | Source: Ambulatory Visit | Attending: Radiation Oncology | Admitting: Radiation Oncology

## 2014-01-12 ENCOUNTER — Ambulatory Visit: Payer: Medicare Other

## 2014-01-12 DIAGNOSIS — Z510 Encounter for antineoplastic radiation therapy: Secondary | ICD-10-CM | POA: Diagnosis not present

## 2014-01-15 ENCOUNTER — Ambulatory Visit
Admission: RE | Admit: 2014-01-15 | Discharge: 2014-01-15 | Disposition: A | Discharge status: Home / Self Care | Payer: Medicare Other | Source: Ambulatory Visit | Attending: Radiation Oncology | Admitting: Radiation Oncology

## 2014-01-15 DIAGNOSIS — C169 Malignant neoplasm of stomach, unspecified: Secondary | ICD-10-CM

## 2014-01-15 DIAGNOSIS — Z510 Encounter for antineoplastic radiation therapy: Secondary | ICD-10-CM | POA: Diagnosis not present

## 2014-01-15 MED ORDER — RADIAPLEXRX EX GEL
Freq: Once | CUTANEOUS | Status: AC
  Administered 2014-01-15: 13:00:00 via TOPICAL

## 2014-01-15 NOTE — Progress Notes (Signed)
Melvin Gardner was given the Radiation Therapy and You book and discussed potential side effects/management of diarrhea, nausea, fatigue, skin changes and bladder changes.  He was given radiaplex gel and was instructed to apply it to his abdomen twice a day, after treatment and at bedtime.  He was educated about under treat day with Dr. Lisbeth Renshaw on Friday's.  He was advised to call with any questions or concerns.  He also reported that he has been having shooting pains in his right shoulder/neck that radiate up to behind his right ear.  He said it started about 2 weeks ago and happen 5-6 times per day.  He is using a hot water bottle for relief.  Will notify Dr. Lisbeth Renshaw.

## 2014-01-16 ENCOUNTER — Ambulatory Visit
Admission: RE | Admit: 2014-01-16 | Payer: Medicare Other | Source: Ambulatory Visit | Attending: Radiation Oncology | Admitting: Radiation Oncology

## 2014-01-16 ENCOUNTER — Ambulatory Visit
Admission: RE | Admit: 2014-01-16 | Discharge: 2014-01-16 | Disposition: A | Discharge status: Home / Self Care | Payer: Medicare Other | Source: Ambulatory Visit | Attending: Radiation Oncology | Admitting: Radiation Oncology

## 2014-01-16 DIAGNOSIS — Z510 Encounter for antineoplastic radiation therapy: Secondary | ICD-10-CM | POA: Diagnosis not present

## 2014-01-17 ENCOUNTER — Ambulatory Visit
Admission: RE | Admit: 2014-01-17 | Discharge: 2014-01-17 | Disposition: A | Discharge status: Home / Self Care | Payer: Medicare Other | Source: Ambulatory Visit | Attending: Radiation Oncology | Admitting: Radiation Oncology

## 2014-01-17 DIAGNOSIS — Z510 Encounter for antineoplastic radiation therapy: Secondary | ICD-10-CM | POA: Diagnosis not present

## 2014-01-18 ENCOUNTER — Ambulatory Visit: Payer: Medicare Other

## 2014-01-18 ENCOUNTER — Telehealth: Payer: Self-pay | Admitting: *Deleted

## 2014-01-18 NOTE — Telephone Encounter (Signed)
Called and spoke with Melvin Gardner, asked since he had cancelled today due to transportation issues if he would be here tomorrow?"Yes, no problem I'll be there", asked how his neck was also, still using the hot water bottle for heat to neck ,helping some, thanked this rn for calling and checking on him 1:32 PM

## 2014-01-19 ENCOUNTER — Ambulatory Visit
Admission: RE | Admit: 2014-01-19 | Discharge: 2014-01-19 | Disposition: A | Discharge status: Home / Self Care | Payer: Medicare Other | Source: Ambulatory Visit | Attending: Radiation Oncology | Admitting: Radiation Oncology

## 2014-01-19 VITALS — BP 125/82 | HR 85 | Temp 97.9°F

## 2014-01-19 DIAGNOSIS — Z510 Encounter for antineoplastic radiation therapy: Secondary | ICD-10-CM | POA: Diagnosis not present

## 2014-01-19 DIAGNOSIS — C163 Malignant neoplasm of pyloric antrum: Secondary | ICD-10-CM

## 2014-01-19 NOTE — Progress Notes (Addendum)
  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  Identity was confirmed.  All relevant records and images related to the planned course of therapy were reviewed.   Written consent to proceed with treatment was confirmed which was freely given after reviewing the details related to the planned course of therapy had been reviewed with the patient.  Then, the patient was set-up in a stable reproducible  supine position for radiation therapy.  CT images were obtained.  Surface markings were placed.    Medically necessary complex treatment device(s) for immobilization:  Customized VAC lock bag.   The CT images were loaded into the planning software.  Then the target and avoidance structures were contoured.  Treatment planning then occurred.  The radiation prescription was entered and confirmed.  A total of 4 complex treatment devices were fabricated which relate to the designed radiation treatment fields. Each of these customized fields/ complex treatment devices will be used on a daily basis during the radiation course. I have requested : 3D Simulation  I have requested a DVH of the following structures: Target, spinal cord, kidneys, bowel.   The patient will undergo daily image  guidance to ensure accurate localization of the target, and adequate minimize dose to the normal surrounding structures in close proximity to the target.  PLAN:  The patient will receive 54 Gy in 30 fractions.  ________________________________   Jodelle Gross, MD, PhD

## 2014-01-19 NOTE — Progress Notes (Signed)
Weekly assessment of radiation to stomach.Completed 4 of 30 treatments.Denies pain, nausea or any other problems.

## 2014-01-19 NOTE — Progress Notes (Signed)
   Department of Radiation Oncology  Phone:  [phone] Fax:        [phone]  Weekly Treatment Note    Name: Melvin Gardner Date: 01/19/2014 MRN: [id number] DOB: 03-03-1930   Current dose: 7.2 Gy  Current fraction: 4   MEDICATIONS: Current Outpatient Prescriptions  Medication Sig Dispense Refill  . albuterol (PROVENTIL HFA;VENTOLIN HFA) 108 (90 BASE) MCG/ACT inhaler Inhale 2 puffs into the lungs every 6 (six) hours as needed for wheezing.      [name] albuterol (PROVENTIL) (2.5 MG/3ML) 0.083% nebulizer solution Take 2.5 mg by nebulization every 6 (six) hours as needed for wheezing.      . bicalutamide (CASODEX) 50 MG tablet Take 50 mg by mouth daily.      [name] esomeprazole (NEXIUM) 40 MG packet Take 40 mg by mouth daily before breakfast.  30 each  0  . ferrous sulfate 325 (65 FE) MG tablet Take 325 mg by mouth daily with breakfast.      . Fluticasone-Salmeterol (ADVAIR) 250-50 MCG/DOSE AEPB Inhale 1 puff into the lungs every 12 (twelve) hours.      . hyaluronate sodium (RADIAPLEXRX) GEL Apply 1 application topically daily.      [name] latanoprost (XALATAN) 0.005 % ophthalmic solution       . metFORMIN (GLUCOPHAGE) 500 MG tablet Take 1,000 mg by mouth 2 (two) times daily with a meal.      . rosuvastatin (CRESTOR) 10 MG tablet Take 10 mg by mouth daily.      . sitaGLIPtin (JANUVIA) 50 MG tablet Take 50 mg by mouth daily.      . sucralfate (CARAFATE) 1 G tablet Take 1 tablet (1 g total) by mouth 4 (four) times daily.  120 tablet  0  . tamsulosin (FLOMAX) 0.4 MG CAPS Take 0.4 mg by mouth daily after breakfast.       No current facility-administered medications for this encounter.     ALLERGIES: Review of patient's allergies indicates no known allergies.   LABORATORY DATA:  Lab Results  Component Value Date   WBC 6.2 01/10/2014   HGB 10.0* 01/10/2014   HCT 33.5* 01/10/2014   MCV 78.0* 01/10/2014   PLT 255 01/10/2014   Lab Results  Component Value Date   NA 142 12/13/2013   K 3.8  12/13/2013   CL 106 11/23/2013   CO2 26 12/13/2013   Lab Results  Component Value Date   ALT <6 12/13/2013   AST 10 12/13/2013   ALKPHOS 69 12/13/2013   BILITOT 0.46 12/13/2013     NARRATIVE: Melvin Gardner was seen today for weekly treatment management. The chart was checked and the patient's films were reviewed. The patient is doing very well his first week of treatment. No complaints. Nausea.  PHYSICAL EXAMINATION: temperature is 97.9 F (36.6 C). His blood pressure is 125/82 and his pulse is 85.        ASSESSMENT: The patient is doing satisfactorily with treatment.  PLAN: We will continue with the patient's radiation treatment as planned.

## 2014-01-23 ENCOUNTER — Ambulatory Visit
Admission: RE | Admit: 2014-01-23 | Discharge: 2014-01-23 | Disposition: A | Discharge status: Home / Self Care | Payer: Medicare Other | Source: Ambulatory Visit | Attending: Radiation Oncology | Admitting: Radiation Oncology

## 2014-01-23 DIAGNOSIS — Z510 Encounter for antineoplastic radiation therapy: Secondary | ICD-10-CM | POA: Diagnosis not present

## 2014-01-24 ENCOUNTER — Ambulatory Visit
Admission: RE | Admit: 2014-01-24 | Discharge: 2014-01-24 | Disposition: A | Discharge status: Home / Self Care | Payer: Medicare Other | Source: Ambulatory Visit | Attending: Radiation Oncology | Admitting: Radiation Oncology

## 2014-01-24 DIAGNOSIS — Z510 Encounter for antineoplastic radiation therapy: Secondary | ICD-10-CM | POA: Diagnosis not present

## 2014-01-25 ENCOUNTER — Ambulatory Visit
Admission: RE | Admit: 2014-01-25 | Discharge: 2014-01-25 | Disposition: A | Discharge status: Home / Self Care | Payer: Medicare Other | Source: Ambulatory Visit | Attending: Radiation Oncology | Admitting: Radiation Oncology

## 2014-01-25 ENCOUNTER — Encounter: Payer: Self-pay | Admitting: Radiation Oncology

## 2014-01-25 ENCOUNTER — Telehealth: Payer: Self-pay | Admitting: *Deleted

## 2014-01-25 VITALS — BP 98/68 | HR 64 | Temp 97.7°F | Wt 193.7 lb

## 2014-01-25 DIAGNOSIS — C163 Malignant neoplasm of pyloric antrum: Secondary | ICD-10-CM

## 2014-01-25 DIAGNOSIS — Z510 Encounter for antineoplastic radiation therapy: Secondary | ICD-10-CM | POA: Diagnosis not present

## 2014-01-25 NOTE — Progress Notes (Signed)
   Department of Radiation Oncology  Phone:  [phone] Fax:        [phone]  Weekly Treatment Note    Name: Melvin Gardner Date: 01/25/2014 MRN: [id number] DOB: 11/16/1929   Current dose: 12.6 Gy  Current fraction: 7   MEDICATIONS: Current Outpatient Prescriptions  Medication Sig Dispense Refill  . albuterol (PROVENTIL HFA;VENTOLIN HFA) 108 (90 BASE) MCG/ACT inhaler Inhale 2 puffs into the lungs every 6 (six) hours as needed for wheezing.      [name] albuterol (PROVENTIL) (2.5 MG/3ML) 0.083% nebulizer solution Take 2.5 mg by nebulization every 6 (six) hours as needed for wheezing.      [name] esomeprazole (NEXIUM) 40 MG packet Take 40 mg by mouth daily before breakfast.  30 each  0  . ferrous sulfate 325 (65 FE) MG tablet Take 325 mg by mouth daily with breakfast.      . Fluticasone-Salmeterol (ADVAIR) 250-50 MCG/DOSE AEPB Inhale 1 puff into the lungs every 12 (twelve) hours.      . hyaluronate sodium (RADIAPLEXRX) GEL Apply 1 application topically daily.      . metFORMIN (GLUCOPHAGE) 500 MG tablet Take 1,000 mg by mouth 2 (two) times daily with a meal.      . rosuvastatin (CRESTOR) 10 MG tablet Take 10 mg by mouth daily.      . sitaGLIPtin (JANUVIA) 50 MG tablet Take 50 mg by mouth daily.      . sucralfate (CARAFATE) 1 G tablet Take 1 tablet (1 g total) by mouth 4 (four) times daily.  120 tablet  0  . tamsulosin (FLOMAX) 0.4 MG CAPS Take 0.4 mg by mouth daily after breakfast.      . bicalutamide (CASODEX) 50 MG tablet Take 50 mg by mouth daily.      [name] latanoprost (XALATAN) 0.005 % ophthalmic solution        No current facility-administered medications for this encounter.     ALLERGIES: Review of patient's allergies indicates no known allergies.   LABORATORY DATA:  Lab Results  Component Value Date   WBC 6.2 01/10/2014   HGB 10.0* 01/10/2014   HCT 33.5* 01/10/2014   MCV 78.0* 01/10/2014   PLT 255 01/10/2014   Lab Results  Component Value Date   NA 142 12/13/2013   K 3.8  12/13/2013   CL 106 11/23/2013   CO2 26 12/13/2013   Lab Results  Component Value Date   ALT <6 12/13/2013   AST 10 12/13/2013   ALKPHOS 69 12/13/2013   BILITOT 0.46 12/13/2013     NARRATIVE: Melvin Gardner was seen today for weekly treatment management. The chart was checked and the patient's films were reviewed. The patient is doing well with treatment. No nausea. No diarrhea. He has had some shooting pain behind his right ear which is relieved with a warm water bottle. We discussed that this is not related to his treatment. He will let us know if this worsens.  PHYSICAL EXAMINATION: weight is 193 lb 11.2 oz (87.862 kg). His temperature is 97.7 F (36.5 C). His blood pressure is 98/68 and his pulse is 64. His oxygen saturation is 100%.        ASSESSMENT: The patient is doing satisfactorily with treatment.  PLAN: We will continue with the patient's radiation treatment as planned.

## 2014-01-25 NOTE — Progress Notes (Signed)
Weekly assessment of radiation to abdomen for gastric cancer.Denies pain now but states he has a shooting pain down up right neck up behind ear relieved by hot water bottle.States it feels like an Dealer shock.

## 2014-01-25 NOTE — Addendum Note (Signed)
Encounter addended by: Marye Round, MD on: 01/25/2014  1:24 PM<BR>     Documentation filed: Notes Section

## 2014-01-25 NOTE — Telephone Encounter (Signed)
Called pt home phone,  no answer,

## 2014-01-26 ENCOUNTER — Ambulatory Visit
Admission: RE | Admit: 2014-01-26 | Discharge: 2014-01-26 | Disposition: A | Discharge status: Home / Self Care | Payer: Medicare Other | Source: Ambulatory Visit | Attending: Radiation Oncology | Admitting: Radiation Oncology

## 2014-01-26 DIAGNOSIS — Z510 Encounter for antineoplastic radiation therapy: Secondary | ICD-10-CM | POA: Diagnosis not present

## 2014-01-29 ENCOUNTER — Ambulatory Visit
Admission: RE | Admit: 2014-01-29 | Discharge: 2014-01-29 | Disposition: A | Discharge status: Home / Self Care | Payer: Medicare Other | Source: Ambulatory Visit | Attending: Radiation Oncology | Admitting: Radiation Oncology

## 2014-01-29 DIAGNOSIS — Z510 Encounter for antineoplastic radiation therapy: Secondary | ICD-10-CM | POA: Diagnosis not present

## 2014-01-30 ENCOUNTER — Ambulatory Visit
Admission: RE | Admit: 2014-01-30 | Discharge: 2014-01-30 | Disposition: A | Discharge status: Home / Self Care | Payer: Medicare Other | Source: Ambulatory Visit | Attending: Radiation Oncology | Admitting: Radiation Oncology

## 2014-01-30 DIAGNOSIS — Z510 Encounter for antineoplastic radiation therapy: Secondary | ICD-10-CM | POA: Diagnosis not present

## 2014-01-31 ENCOUNTER — Ambulatory Visit
Admission: RE | Admit: 2014-01-31 | Discharge: 2014-01-31 | Disposition: A | Discharge status: Home / Self Care | Payer: Medicare Other | Source: Ambulatory Visit | Attending: Radiation Oncology | Admitting: Radiation Oncology

## 2014-01-31 DIAGNOSIS — Z510 Encounter for antineoplastic radiation therapy: Secondary | ICD-10-CM | POA: Diagnosis not present

## 2014-02-01 ENCOUNTER — Ambulatory Visit
Admission: RE | Admit: 2014-02-01 | Discharge: 2014-02-01 | Disposition: A | Discharge status: Home / Self Care | Payer: Medicare Other | Source: Ambulatory Visit | Attending: Radiation Oncology | Admitting: Radiation Oncology

## 2014-02-01 DIAGNOSIS — Z510 Encounter for antineoplastic radiation therapy: Secondary | ICD-10-CM | POA: Diagnosis not present

## 2014-02-02 ENCOUNTER — Ambulatory Visit
Admission: RE | Admit: 2014-02-02 | Discharge: 2014-02-02 | Disposition: A | Discharge status: Home / Self Care | Payer: Medicare Other | Source: Ambulatory Visit | Attending: Radiation Oncology | Admitting: Radiation Oncology

## 2014-02-02 ENCOUNTER — Encounter: Payer: Self-pay | Admitting: Radiation Oncology

## 2014-02-02 VITALS — BP 141/78 | HR 82 | Temp 97.8°F | Resp 20 | Wt 192.9 lb

## 2014-02-02 DIAGNOSIS — C163 Malignant neoplasm of pyloric antrum: Secondary | ICD-10-CM

## 2014-02-02 DIAGNOSIS — Z510 Encounter for antineoplastic radiation therapy: Secondary | ICD-10-CM | POA: Diagnosis not present

## 2014-02-02 NOTE — Progress Notes (Signed)
Weekly rad txs stomach, no nausea, no gas, loose stool  1x day, on iron daily stated black stool, main c/o very fatigued past 2 days, after rad txs goes home and just rests stated, no pain, appetite fair, drinking plenty fluids 11:58 AM

## 2014-02-02 NOTE — Progress Notes (Signed)
   Department of Radiation Oncology  Phone:  [phone] Fax:        [phone]  Weekly Treatment Note    Name: Melvin Gardner Date: 02/02/2014 MRN: [id number] DOB: 09-22-1929   Current dose: 23.4 Gy  Current fraction: 13   MEDICATIONS: Current Outpatient Prescriptions  Medication Sig Dispense Refill  . albuterol (PROVENTIL HFA;VENTOLIN HFA) 108 (90 BASE) MCG/ACT inhaler Inhale 2 puffs into the lungs every 6 (six) hours as needed for wheezing.      [name] albuterol (PROVENTIL) (2.5 MG/3ML) 0.083% nebulizer solution Take 2.5 mg by nebulization every 6 (six) hours as needed for wheezing.      . bicalutamide (CASODEX) 50 MG tablet Take 50 mg by mouth daily.      [name] esomeprazole (NEXIUM) 40 MG packet Take 40 mg by mouth daily before breakfast.  30 each  0  . ferrous sulfate 325 (65 FE) MG tablet Take 325 mg by mouth daily with breakfast.      . Fluticasone-Salmeterol (ADVAIR) 250-50 MCG/DOSE AEPB Inhale 1 puff into the lungs every 12 (twelve) hours.      . hyaluronate sodium (RADIAPLEXRX) GEL Apply 1 application topically daily.      [name] latanoprost (XALATAN) 0.005 % ophthalmic solution       . metFORMIN (GLUCOPHAGE) 500 MG tablet Take 1,000 mg by mouth 2 (two) times daily with a meal.      . rosuvastatin (CRESTOR) 10 MG tablet Take 10 mg by mouth daily.      . sitaGLIPtin (JANUVIA) 50 MG tablet Take 50 mg by mouth daily.      . sucralfate (CARAFATE) 1 G tablet Take 1 tablet (1 g total) by mouth 4 (four) times daily.  120 tablet  0  . tamsulosin (FLOMAX) 0.4 MG CAPS Take 0.4 mg by mouth daily after breakfast.       No current facility-administered medications for this encounter.     ALLERGIES: Review of patient's allergies indicates no known allergies.   LABORATORY DATA:  Lab Results  Component Value Date   WBC 6.2 01/10/2014   HGB 10.0* 01/10/2014   HCT 33.5* 01/10/2014   MCV 78.0* 01/10/2014   PLT 255 01/10/2014   Lab Results  Component Value Date   NA 142 12/13/2013   K 3.8  12/13/2013   CL 106 11/23/2013   CO2 26 12/13/2013   Lab Results  Component Value Date   ALT <6 12/13/2013   AST 10 12/13/2013   ALKPHOS 69 12/13/2013   BILITOT 0.46 12/13/2013     NARRATIVE: Melvin Gardner was seen today for weekly treatment management. The chart was checked and the patient's films were reviewed. The patient states he is doing very well. No difficulties with nausea or diarrhea.  PHYSICAL EXAMINATION: weight is 192 lb 14.4 oz (87.499 kg). His oral temperature is 97.8 F (36.6 C). His blood pressure is 141/78 and his pulse is 82. His respiration is 20.        ASSESSMENT: The patient is doing satisfactorily with treatment.  PLAN: We will continue with the patient's radiation treatment as planned.

## 2014-02-05 ENCOUNTER — Ambulatory Visit
Admission: RE | Admit: 2014-02-05 | Discharge: 2014-02-05 | Disposition: A | Discharge status: Home / Self Care | Payer: Medicare Other | Source: Ambulatory Visit | Attending: Radiation Oncology | Admitting: Radiation Oncology

## 2014-02-05 DIAGNOSIS — Z510 Encounter for antineoplastic radiation therapy: Secondary | ICD-10-CM | POA: Diagnosis not present

## 2014-02-06 ENCOUNTER — Ambulatory Visit
Admission: RE | Admit: 2014-02-06 | Discharge: 2014-02-06 | Disposition: A | Discharge status: Home / Self Care | Payer: Medicare Other | Source: Ambulatory Visit | Attending: Radiation Oncology | Admitting: Radiation Oncology

## 2014-02-06 DIAGNOSIS — Z510 Encounter for antineoplastic radiation therapy: Secondary | ICD-10-CM | POA: Diagnosis not present

## 2014-02-07 ENCOUNTER — Ambulatory Visit
Admission: RE | Admit: 2014-02-07 | Discharge: 2014-02-07 | Disposition: A | Discharge status: Home / Self Care | Payer: Medicare Other | Source: Ambulatory Visit | Attending: Radiation Oncology | Admitting: Radiation Oncology

## 2014-02-07 DIAGNOSIS — Z510 Encounter for antineoplastic radiation therapy: Secondary | ICD-10-CM | POA: Diagnosis not present

## 2014-02-08 ENCOUNTER — Ambulatory Visit
Admission: RE | Admit: 2014-02-08 | Discharge: 2014-02-08 | Disposition: A | Discharge status: Home / Self Care | Payer: Medicare Other | Source: Ambulatory Visit | Attending: Radiation Oncology | Admitting: Radiation Oncology

## 2014-02-08 DIAGNOSIS — Z510 Encounter for antineoplastic radiation therapy: Secondary | ICD-10-CM | POA: Diagnosis not present

## 2014-02-09 ENCOUNTER — Ambulatory Visit
Admission: RE | Admit: 2014-02-09 | Discharge: 2014-02-09 | Disposition: A | Discharge status: Home / Self Care | Payer: Medicare Other | Source: Ambulatory Visit | Attending: Radiation Oncology | Admitting: Radiation Oncology

## 2014-02-09 VITALS — BP 129/64 | HR 81 | Temp 97.6°F | Resp 12 | Wt 189.8 lb

## 2014-02-09 DIAGNOSIS — C163 Malignant neoplasm of pyloric antrum: Secondary | ICD-10-CM

## 2014-02-09 DIAGNOSIS — Z510 Encounter for antineoplastic radiation therapy: Secondary | ICD-10-CM | POA: Diagnosis not present

## 2014-02-09 NOTE — Progress Notes (Signed)
   Department of Radiation Oncology  Phone:  [phone] Fax:        [phone]  Weekly Treatment Note    Name: Melvin Gardner Date: 02/09/2014 MRN: [id number] DOB: 08-11-1929   Current dose: 32.4 Gy  Current fraction: 18   MEDICATIONS: Current Outpatient Prescriptions  Medication Sig Dispense Refill  . albuterol (PROVENTIL HFA;VENTOLIN HFA) 108 (90 BASE) MCG/ACT inhaler Inhale 2 puffs into the lungs every 6 (six) hours as needed for wheezing.      [name] albuterol (PROVENTIL) (2.5 MG/3ML) 0.083% nebulizer solution Take 2.5 mg by nebulization every 6 (six) hours as needed for wheezing.      . bicalutamide (CASODEX) 50 MG tablet Take 50 mg by mouth daily.      [name] esomeprazole (NEXIUM) 40 MG packet Take 40 mg by mouth daily before breakfast.  30 each  0  . ferrous sulfate 325 (65 FE) MG tablet Take 325 mg by mouth daily with breakfast.      . Fluticasone-Salmeterol (ADVAIR) 250-50 MCG/DOSE AEPB Inhale 1 puff into the lungs every 12 (twelve) hours.      . hyaluronate sodium (RADIAPLEXRX) GEL Apply 1 application topically daily.      [name] latanoprost (XALATAN) 0.005 % ophthalmic solution       . metFORMIN (GLUCOPHAGE) 500 MG tablet Take 1,000 mg by mouth 2 (two) times daily with a meal.      . rosuvastatin (CRESTOR) 10 MG tablet Take 10 mg by mouth daily.      . sitaGLIPtin (JANUVIA) 50 MG tablet Take 50 mg by mouth daily.      . sucralfate (CARAFATE) 1 G tablet Take 1 tablet (1 g total) by mouth 4 (four) times daily.  120 tablet  0  . tamsulosin (FLOMAX) 0.4 MG CAPS Take 0.4 mg by mouth daily after breakfast.       No current facility-administered medications for this encounter.     ALLERGIES: Review of patient's allergies indicates no known allergies.   LABORATORY DATA:  Lab Results  Component Value Date   WBC 6.2 01/10/2014   HGB 10.0* 01/10/2014   HCT 33.5* 01/10/2014   MCV 78.0* 01/10/2014   PLT 255 01/10/2014   Lab Results  Component Value Date   NA 142 12/13/2013   K 3.8  12/13/2013   CL 106 11/23/2013   CO2 26 12/13/2013   Lab Results  Component Value Date   ALT <6 12/13/2013   AST 10 12/13/2013   ALKPHOS 69 12/13/2013   BILITOT 0.46 12/13/2013     NARRATIVE: Melvin Gardner was seen today for weekly treatment management. The chart was checked and the patient's films were reviewed. The patient states he is doing very well today. He looks good. No nausea. No diarrhea.  PHYSICAL EXAMINATION: weight is 189 lb 12.8 oz (86.093 kg). His oral temperature is 97.6 F (36.4 C). His blood pressure is 129/64 and his pulse is 81. His respiration is 12 and oxygen saturation is 97%.        ASSESSMENT: The patient is doing satisfactorily with treatment.  PLAN: We will continue with the patient's radiation treatment as planned.

## 2014-02-09 NOTE — Progress Notes (Signed)
He is currently in no pain. Pt complains of, Fatigue and Generalized Weakness. Tarry Stool, loose, a bowel movement every other day. The patient eats a regular, healthy diet.. Noted skin is warm dry and intact. Pt reports occasionally he has sharp pains along the right side of his neck.

## 2014-02-12 ENCOUNTER — Ambulatory Visit
Admission: RE | Admit: 2014-02-12 | Discharge: 2014-02-12 | Disposition: A | Discharge status: Home / Self Care | Payer: Medicare Other | Source: Ambulatory Visit | Attending: Radiation Oncology | Admitting: Radiation Oncology

## 2014-02-12 DIAGNOSIS — Z510 Encounter for antineoplastic radiation therapy: Secondary | ICD-10-CM | POA: Diagnosis not present

## 2014-02-13 ENCOUNTER — Ambulatory Visit
Admission: RE | Admit: 2014-02-13 | Discharge: 2014-02-13 | Disposition: A | Discharge status: Home / Self Care | Payer: Medicare Other | Source: Ambulatory Visit | Attending: Radiation Oncology | Admitting: Radiation Oncology

## 2014-02-13 DIAGNOSIS — Z510 Encounter for antineoplastic radiation therapy: Secondary | ICD-10-CM | POA: Diagnosis not present

## 2014-02-14 ENCOUNTER — Ambulatory Visit
Admission: RE | Admit: 2014-02-14 | Discharge: 2014-02-14 | Disposition: A | Discharge status: Home / Self Care | Payer: Medicare Other | Source: Ambulatory Visit | Attending: Radiation Oncology | Admitting: Radiation Oncology

## 2014-02-14 DIAGNOSIS — Z510 Encounter for antineoplastic radiation therapy: Secondary | ICD-10-CM | POA: Diagnosis not present

## 2014-02-15 ENCOUNTER — Ambulatory Visit
Admission: RE | Admit: 2014-02-15 | Discharge: 2014-02-15 | Disposition: A | Discharge status: Home / Self Care | Payer: Medicare Other | Source: Ambulatory Visit | Attending: Radiation Oncology | Admitting: Radiation Oncology

## 2014-02-15 DIAGNOSIS — Z510 Encounter for antineoplastic radiation therapy: Secondary | ICD-10-CM | POA: Insufficient documentation

## 2014-02-15 DIAGNOSIS — C163 Malignant neoplasm of pyloric antrum: Secondary | ICD-10-CM | POA: Diagnosis not present

## 2014-02-15 DIAGNOSIS — Z79899 Other long term (current) drug therapy: Secondary | ICD-10-CM | POA: Insufficient documentation

## 2014-02-16 ENCOUNTER — Encounter: Payer: Self-pay | Admitting: Radiation Oncology

## 2014-02-16 ENCOUNTER — Ambulatory Visit
Admission: RE | Admit: 2014-02-16 | Discharge: 2014-02-16 | Disposition: A | Discharge status: Home / Self Care | Payer: Medicare Other | Source: Ambulatory Visit | Attending: Radiation Oncology | Admitting: Radiation Oncology

## 2014-02-16 VITALS — BP 110/70 | HR 89 | Resp 18 | Wt 188.7 lb

## 2014-02-16 DIAGNOSIS — Z510 Encounter for antineoplastic radiation therapy: Secondary | ICD-10-CM | POA: Diagnosis not present

## 2014-02-16 DIAGNOSIS — C163 Malignant neoplasm of pyloric antrum: Secondary | ICD-10-CM

## 2014-02-16 NOTE — Progress Notes (Signed)
   Department of Radiation Oncology  Phone:  [phone] Fax:        [phone]  Weekly Treatment Note    Name: Melvin Gardner Date: 02/16/2014 MRN: [id number] DOB: 1930/03/30   Current dose: 41.4 Gy  Current fraction: 23   MEDICATIONS: Current Outpatient Prescriptions  Medication Sig Dispense Refill  . albuterol (PROVENTIL HFA;VENTOLIN HFA) 108 (90 BASE) MCG/ACT inhaler Inhale 2 puffs into the lungs every 6 (six) hours as needed for wheezing.      [name] albuterol (PROVENTIL) (2.5 MG/3ML) 0.083% nebulizer solution Take 2.5 mg by nebulization every 6 (six) hours as needed for wheezing.      . bicalutamide (CASODEX) 50 MG tablet Take 50 mg by mouth daily.      [name] esomeprazole (NEXIUM) 40 MG packet Take 40 mg by mouth daily before breakfast.  30 each  0  . ferrous sulfate 325 (65 FE) MG tablet Take 325 mg by mouth daily with breakfast.      . Fluticasone-Salmeterol (ADVAIR) 250-50 MCG/DOSE AEPB Inhale 1 puff into the lungs every 12 (twelve) hours.      . hyaluronate sodium (RADIAPLEXRX) GEL Apply 1 application topically daily.      [name] latanoprost (XALATAN) 0.005 % ophthalmic solution       . metFORMIN (GLUCOPHAGE) 500 MG tablet Take 1,000 mg by mouth 2 (two) times daily with a meal.      . rosuvastatin (CRESTOR) 10 MG tablet Take 10 mg by mouth daily.      . sitaGLIPtin (JANUVIA) 50 MG tablet Take 50 mg by mouth daily.      . sucralfate (CARAFATE) 1 G tablet Take 1 tablet (1 g total) by mouth 4 (four) times daily.  120 tablet  0  . tamsulosin (FLOMAX) 0.4 MG CAPS Take 0.4 mg by mouth daily after breakfast.       No current facility-administered medications for this encounter.     ALLERGIES: Review of patient's allergies indicates no known allergies.   LABORATORY DATA:  Lab Results  Component Value Date   WBC 6.2 01/10/2014   HGB 10.0* 01/10/2014   HCT 33.5* 01/10/2014   MCV 78.0* 01/10/2014   PLT 255 01/10/2014   Lab Results  Component Value Date   NA 142 12/13/2013   K 3.8  12/13/2013   CL 106 11/23/2013   CO2 26 12/13/2013   Lab Results  Component Value Date   ALT <6 12/13/2013   AST 10 12/13/2013   ALKPHOS 69 12/13/2013   BILITOT 0.46 12/13/2013     NARRATIVE: Melvin Gardner was seen today for weekly treatment management. The chart was checked and the patient's films were reviewed. The patient continues to do very well. Good appetite. He has dark stools but continues to take iron.  PHYSICAL EXAMINATION: weight is 188 lb 11.2 oz (85.594 kg). His blood pressure is 110/70 and his pulse is 89. His respiration is 18 and oxygen saturation is 98%.        ASSESSMENT: The patient is doing satisfactorily with treatment.  PLAN: We will continue with the patient's radiation treatment as planned.

## 2014-02-16 NOTE — Progress Notes (Signed)
Weight and vitals stable. Reports he had a bowel movement yesterday. Describes stools as black tarry and loose to formed. Denies blood in his stool. Reports right cervical neck pain since the start of treatment that requires him to apply a warm compress. Reports he has a good appetite. Denies nausea or vomiting. Denies dysuria.

## 2014-02-19 ENCOUNTER — Encounter: Payer: Self-pay | Admitting: Radiation Oncology

## 2014-02-19 ENCOUNTER — Ambulatory Visit
Admission: RE | Admit: 2014-02-19 | Discharge: 2014-02-19 | Disposition: A | Discharge status: Home / Self Care | Payer: Medicare Other | Source: Ambulatory Visit | Attending: Radiation Oncology | Admitting: Radiation Oncology

## 2014-02-19 DIAGNOSIS — Z510 Encounter for antineoplastic radiation therapy: Secondary | ICD-10-CM | POA: Diagnosis not present

## 2014-02-20 ENCOUNTER — Ambulatory Visit
Admission: RE | Admit: 2014-02-20 | Discharge: 2014-02-20 | Disposition: A | Discharge status: Home / Self Care | Payer: Medicare Other | Source: Ambulatory Visit | Attending: Radiation Oncology | Admitting: Radiation Oncology

## 2014-02-20 DIAGNOSIS — Z510 Encounter for antineoplastic radiation therapy: Secondary | ICD-10-CM | POA: Diagnosis not present

## 2014-02-21 ENCOUNTER — Ambulatory Visit
Admission: RE | Admit: 2014-02-21 | Discharge: 2014-02-21 | Disposition: A | Discharge status: Home / Self Care | Payer: Medicare Other | Source: Ambulatory Visit | Attending: Radiation Oncology | Admitting: Radiation Oncology

## 2014-02-21 DIAGNOSIS — Z510 Encounter for antineoplastic radiation therapy: Secondary | ICD-10-CM | POA: Diagnosis not present

## 2014-02-22 ENCOUNTER — Ambulatory Visit
Admission: RE | Admit: 2014-02-22 | Discharge: 2014-02-22 | Disposition: A | Discharge status: Home / Self Care | Payer: Medicare Other | Source: Ambulatory Visit | Attending: Radiation Oncology | Admitting: Radiation Oncology

## 2014-02-22 DIAGNOSIS — Z510 Encounter for antineoplastic radiation therapy: Secondary | ICD-10-CM | POA: Diagnosis not present

## 2014-02-23 ENCOUNTER — Ambulatory Visit
Admission: RE | Admit: 2014-02-23 | Discharge: 2014-02-23 | Disposition: A | Discharge status: Home / Self Care | Payer: Medicare Other | Source: Ambulatory Visit | Attending: Radiation Oncology | Admitting: Radiation Oncology

## 2014-02-23 ENCOUNTER — Encounter: Payer: Self-pay | Admitting: Radiation Oncology

## 2014-02-23 VITALS — BP 125/61 | HR 91 | Temp 98.0°F | Resp 20 | Wt 184.7 lb

## 2014-02-23 DIAGNOSIS — Z510 Encounter for antineoplastic radiation therapy: Secondary | ICD-10-CM | POA: Diagnosis not present

## 2014-02-23 DIAGNOSIS — C163 Malignant neoplasm of pyloric antrum: Secondary | ICD-10-CM

## 2014-02-23 NOTE — Progress Notes (Signed)
Patient denies pain, but he is fatigued and has loss of appetite. He denies nausea. He states he has not had BM x 3 days. He states he normally has BM every other day. Advised he may need to drink prune juice or take stool softener. Pt states if he drinks milk, he'll have BM. He states he  Has milk at home. Pt will complete next Tues; gave him FU appointment card.

## 2014-02-23 NOTE — Progress Notes (Signed)
   Department of Radiation Oncology  Phone:  [phone] Fax:        [phone]  Weekly Treatment Note    Name: Melvin Gardner Date: 02/23/2014 MRN: [id number] DOB: [birth year]   Current dose: 50.4 Gy  Current fraction: 28   MEDICATIONS: Current Outpatient Prescriptions  Medication Sig Dispense Refill  . albuterol (PROVENTIL HFA;VENTOLIN HFA) 108 (90 BASE) MCG/ACT inhaler Inhale 2 puffs into the lungs every 6 (six) hours as needed for wheezing.      [name] albuterol (PROVENTIL) (2.5 MG/3ML) 0.083% nebulizer solution Take 2.5 mg by nebulization every 6 (six) hours as needed for wheezing.      . bicalutamide (CASODEX) 50 MG tablet Take 50 mg by mouth daily.      [name] esomeprazole (NEXIUM) 40 MG packet Take 40 mg by mouth daily before breakfast.  30 each  0  . ferrous sulfate 325 (65 FE) MG tablet Take 325 mg by mouth daily with breakfast.      . Fluticasone-Salmeterol (ADVAIR) 250-50 MCG/DOSE AEPB Inhale 1 puff into the lungs every 12 (twelve) hours.      . hyaluronate sodium (RADIAPLEXRX) GEL Apply 1 application topically daily.      [name] latanoprost (XALATAN) 0.005 % ophthalmic solution       . metFORMIN (GLUCOPHAGE) 500 MG tablet Take 1,000 mg by mouth 2 (two) times daily with a meal.      . rosuvastatin (CRESTOR) 10 MG tablet Take 10 mg by mouth daily.      . sitaGLIPtin (JANUVIA) 50 MG tablet Take 50 mg by mouth daily.      . sucralfate (CARAFATE) 1 G tablet Take 1 tablet (1 g total) by mouth 4 (four) times daily.  120 tablet  0  . tamsulosin (FLOMAX) 0.4 MG CAPS Take 0.4 mg by mouth daily after breakfast.       No current facility-administered medications for this encounter.     ALLERGIES: Review of patient's allergies indicates no known allergies.   LABORATORY DATA:  Lab Results  Component Value Date   WBC 6.2 01/10/2014   HGB 10.0* 01/10/2014   HCT 33.5* 01/10/2014   MCV 78.0* 01/10/2014   PLT 255 01/10/2014   Lab Results  Component Value Date   NA 142 12/13/2013   K 3.8  12/13/2013   CL 106 11/23/2013   CO2 26 12/13/2013   Lab Results  Component Value Date   ALT <6 12/13/2013   AST 10 12/13/2013   ALKPHOS 69 12/13/2013   BILITOT 0.46 12/13/2013     NARRATIVE: Melvin Gardner was seen today for weekly treatment management. The chart was checked and the patient's films were reviewed. The patient continues to do very well. No diarrhea. He has had a little bit of constipation and will begin using a school softener.  PHYSICAL EXAMINATION: weight is 184 lb 11.2 oz (83.779 kg). His oral temperature is 98 F (36.7 C). His blood pressure is 125/61 and his pulse is 91. His respiration is 20 and oxygen saturation is 99%.        ASSESSMENT: The patient is doing satisfactorily with treatment.  PLAN: We will continue with the patient's radiation treatment as planned. The patient has done excellent with treatment. He will followup in my clinic in one month.

## 2014-02-26 ENCOUNTER — Ambulatory Visit: Payer: Medicare Other

## 2014-02-26 ENCOUNTER — Ambulatory Visit
Admission: RE | Admit: 2014-02-26 | Discharge: 2014-02-26 | Disposition: A | Discharge status: Home / Self Care | Payer: Medicare Other | Source: Ambulatory Visit | Attending: Radiation Oncology | Admitting: Radiation Oncology

## 2014-02-26 DIAGNOSIS — Z510 Encounter for antineoplastic radiation therapy: Secondary | ICD-10-CM | POA: Diagnosis not present

## 2014-02-27 ENCOUNTER — Ambulatory Visit: Payer: Medicare Other

## 2014-02-27 ENCOUNTER — Encounter: Payer: Self-pay | Admitting: Radiation Oncology

## 2014-02-27 ENCOUNTER — Ambulatory Visit
Admission: RE | Admit: 2014-02-27 | Discharge: 2014-02-27 | Disposition: A | Discharge status: Home / Self Care | Payer: Medicare Other | Source: Ambulatory Visit | Attending: Radiation Oncology | Admitting: Radiation Oncology

## 2014-02-27 DIAGNOSIS — Z510 Encounter for antineoplastic radiation therapy: Secondary | ICD-10-CM | POA: Diagnosis not present

## 2014-02-28 ENCOUNTER — Ambulatory Visit (HOSPITAL_BASED_OUTPATIENT_CLINIC_OR_DEPARTMENT_OTHER): Payer: Medicare Other

## 2014-02-28 ENCOUNTER — Ambulatory Visit (HOSPITAL_BASED_OUTPATIENT_CLINIC_OR_DEPARTMENT_OTHER): Payer: Medicare Other | Admitting: Oncology

## 2014-02-28 ENCOUNTER — Telehealth: Payer: Self-pay | Admitting: Oncology

## 2014-02-28 VITALS — BP 108/69 | HR 48 | Temp 98.2°F | Resp 18 | Ht 69.0 in | Wt 183.7 lb

## 2014-02-28 DIAGNOSIS — D500 Iron deficiency anemia secondary to blood loss (chronic): Secondary | ICD-10-CM

## 2014-02-28 DIAGNOSIS — C169 Malignant neoplasm of stomach, unspecified: Secondary | ICD-10-CM

## 2014-02-28 DIAGNOSIS — C61 Malignant neoplasm of prostate: Secondary | ICD-10-CM

## 2014-02-28 LAB — CBC WITH DIFFERENTIAL/PLATELET
BASO%: 0.2 % (ref 0.0–2.0)
Basophils Absolute: 0 10*3/uL (ref 0.0–0.1)
EOS%: 2.4 % (ref 0.0–7.0)
Eosinophils Absolute: 0.1 10*3/uL (ref 0.0–0.5)
HEMATOCRIT: 34 % — AB (ref 38.4–49.9)
HGB: 10.8 g/dL — ABNORMAL LOW (ref 13.0–17.1)
LYMPH%: 5.1 % — AB (ref 14.0–49.0)
MCH: 26.7 pg — AB (ref 27.2–33.4)
MCHC: 31.8 g/dL — AB (ref 32.0–36.0)
MCV: 84.2 fL (ref 79.3–98.0)
MONO#: 0.4 10*3/uL (ref 0.1–0.9)
MONO%: 7.1 % (ref 0.0–14.0)
NEUT#: 4.2 10*3/uL (ref 1.5–6.5)
NEUT%: 85.2 % — ABNORMAL HIGH (ref 39.0–75.0)
PLATELETS: 204 10*3/uL (ref 140–400)
RBC: 4.04 10*6/uL — ABNORMAL LOW (ref 4.20–5.82)
RDW: 18.7 % — ABNORMAL HIGH (ref 11.0–14.6)
WBC: 4.9 10*3/uL (ref 4.0–10.3)
lymph#: 0.3 10*3/uL — ABNORMAL LOW (ref 0.9–3.3)

## 2014-02-28 LAB — COMPREHENSIVE METABOLIC PANEL (CC13)
ALT: 8 U/L (ref 0–55)
ANION GAP: 9 meq/L (ref 3–11)
AST: 10 U/L (ref 5–34)
Albumin: 3.5 g/dL (ref 3.5–5.0)
Alkaline Phosphatase: 64 U/L (ref 40–150)
BUN: 7.3 mg/dL (ref 7.0–26.0)
CO2: 25 mEq/L (ref 22–29)
CREATININE: 0.7 mg/dL (ref 0.7–1.3)
Calcium: 9.4 mg/dL (ref 8.4–10.4)
Chloride: 109 mEq/L (ref 98–109)
Glucose: 142 mg/dl — ABNORMAL HIGH (ref 70–140)
Potassium: 3.3 mEq/L — ABNORMAL LOW (ref 3.5–5.1)
Sodium: 144 mEq/L (ref 136–145)
Total Bilirubin: 0.77 mg/dL (ref 0.20–1.20)
Total Protein: 6.6 g/dL (ref 6.4–8.3)

## 2014-02-28 NOTE — Progress Notes (Signed)
Hematology and Oncology Follow Up Visit  Melvin Gardner [id number] [birth year] 80 y.o. 02/28/2014 11:45 AM Melvin Gardner., MDShadad, Melvin Dad, MD   Principle Diagnosis: 67 year old gentleman with the following diagnoses:  1. Advanced prostate cancer hormone sensitive at this time he sees androgen depravation with Lupron and Casodex under the care of Dr. Janice Gardner.   2. Gastric cancer diagnosed in July of 2015 presented with acute GI bleed from an antral ulcer. His disease is localized to the stomach without any evidence of metastasis.  Pasr therapy: Radiation therapy for palliative purposes for his gastric cancer. Is not a candidate for surgical resection. This was completed in October of 2015.  Interim History:  Melvin Gardner presents today for a followup visit.  Since his last visit, he completed radiation therapy without any major complications. He does report some occasional fatigue and pain in his lower extremities. He still able to ambulate without any difficulty inside his house. He does not report any abdominal pain or discomfort. He does not report any hematochezia or melena. He still have some discoloration of his stool but no bleeding. He does report neck pain which is muscular in nature. He is not report any headaches or blurry vision or syncope. His performance status is limited but unchanged. He still lives independently and his niece checks on him periodically. He does not report any chest pain palpitations orthopnea or PND. Does not report any cough or dyspnea exertion or hemoptysis. He is not reporting frequency urgency or hesitancy. Does not report any skeletal pain back pain or shoulder pain or hip pain or any rib pain. He does not report any lymphadenopathy or petechiae or bleeding. He has not reported any anxiety or depression. The rest of review of review of systems unremarkable.    Medications: I have reviewed the patient's current medications.  Current Outpatient Prescriptions   Medication Sig Dispense Refill  . albuterol (PROVENTIL HFA;VENTOLIN HFA) 108 (90 BASE) MCG/ACT inhaler Inhale 2 puffs into the lungs every 6 (six) hours as needed for wheezing.      [name] albuterol (PROVENTIL) (2.5 MG/3ML) 0.083% nebulizer solution Take 2.5 mg by nebulization every 6 (six) hours as needed for wheezing.      . bicalutamide (CASODEX) 50 MG tablet Take 50 mg by mouth daily.      [name] esomeprazole (NEXIUM) 40 MG packet Take 40 mg by mouth daily before breakfast.  30 each  0  . ferrous sulfate 325 (65 FE) MG tablet Take 325 mg by mouth daily with breakfast.      . Fluticasone-Salmeterol (ADVAIR) 250-50 MCG/DOSE AEPB Inhale 1 puff into the lungs every 12 (twelve) hours.      . hyaluronate sodium (RADIAPLEXRX) GEL Apply 1 application topically daily.      [name] latanoprost (XALATAN) 0.005 % ophthalmic solution       . metFORMIN (GLUCOPHAGE) 500 MG tablet Take 1,000 mg by mouth 2 (two) times daily with a meal.      . rosuvastatin (CRESTOR) 10 MG tablet Take 10 mg by mouth daily.      . sitaGLIPtin (JANUVIA) 50 MG tablet Take 50 mg by mouth daily.      . sucralfate (CARAFATE) 1 G tablet Take 1 tablet (1 g total) by mouth 4 (four) times daily.  120 tablet  0  . tamsulosin (FLOMAX) 0.4 MG CAPS Take 0.4 mg by mouth daily after breakfast.       No current facility-administered medications for this visit.     Allergies:  No Known Allergies  Past Medical History, Surgical history, Social history, and Family History were reviewed and updated.  Physical Exam: Blood pressure 108/69, pulse 48, temperature 98.2 F (36.8 C), temperature source Oral, resp. rate 18, height 5\' 9"  (1.753 m), weight 183 lb 11.2 oz (83.326 kg), SpO2 100.00%. ECOG: 1 General appearance: alert and cooperative Head: Normocephalic, without obvious abnormality Neck: no adenopathy Lymph nodes: Cervical, supraclavicular, and axillary nodes normal. Heart:regular rate and rhythm, S1, S2 normal, no murmur, click, rub or  gallop Lung:chest clear, no wheezing, rales, normal symmetric air entry Abdomin: soft, non-tender, without masses or organomegaly EXT:no erythema, induration, or nodules   Lab Results: Lab Results  Component Value Date   WBC 4.9 02/28/2014   HGB 10.8* 02/28/2014   HCT 34.0* 02/28/2014   MCV 84.2 02/28/2014   PLT 204 02/28/2014     Chemistry      Component Value Date/Time   NA 142 12/13/2013 1107   NA 143 11/23/2013 1410   K 3.8 12/13/2013 1107   K 4.0 11/23/2013 1410   CL 106 11/23/2013 1410   CO2 26 12/13/2013 1107   CO2 23 11/23/2013 1410   BUN 12.3 12/13/2013 1107   BUN 13 11/23/2013 1410   CREATININE 0.7 12/13/2013 1107   CREATININE 0.58 11/23/2013 1410      Component Value Date/Time   CALCIUM 9.3 12/13/2013 1107   CALCIUM 9.0 11/23/2013 1410   ALKPHOS 69 12/13/2013 1107   ALKPHOS 71 01/23/2007 0811   AST 10 12/13/2013 1107   AST 18 01/23/2007 0811   ALT <6 12/13/2013 1107   ALT 17 01/23/2007 0811   BILITOT 0.46 12/13/2013 1107   BILITOT 1.0 01/23/2007 0811     Impression and Plan:  80 year old gentleman with the following issues:  1. Gastric cancer presented with acute GI bleeding and an antral ulcer. He status post an endoscopy and a biopsy. His PET CT scan obtained on 12/26/2013 did not show any evidence of lymphadenopathy or disease outside of his stomach. There is some metabolic activity in the left rib and vertebral body likely related to his prostate cancer. He is a candidate for any surgical resection and completed palliative radiation therapy. The plan is to continue active surveillance and address his symptoms as they arise. He is not candidate for multi-agent systemic chemotherapy.  2. Advanced prostate cancer: He appears to be hormone sensitive at this time. He is continuing on Lupron and Casodex per Dr. Janice Gardner and he does have metastatic bony disease. His last PSA was under reasonable control of 3.41 but he is at risk of developing castration resistant disease. No need to change any  therapy at this time.  3. Anemia: This is related to his GI blood loss. His hemoglobin has stabilized and has not required transfusions. I will recheck his iron stores in about 3 months to ensure stability.          [facility], MD 10/14/201511:45 AM

## 2014-02-28 NOTE — Telephone Encounter (Signed)
Pt confirmed labs/ov per 10/14 POF, gave pt AVS.... KJ °

## 2014-03-01 ENCOUNTER — Encounter: Payer: Self-pay | Admitting: *Deleted

## 2014-03-20 ENCOUNTER — Encounter: Payer: Self-pay | Admitting: Radiation Oncology

## 2014-03-29 ENCOUNTER — Encounter: Payer: Self-pay | Admitting: Radiation Oncology

## 2014-03-29 ENCOUNTER — Ambulatory Visit
Admission: RE | Admit: 2014-03-29 | Discharge: 2014-03-29 | Disposition: A | Discharge status: Home / Self Care | Payer: Medicare Other | Source: Ambulatory Visit | Attending: Radiation Oncology | Admitting: Radiation Oncology

## 2014-03-29 VITALS — BP 96/65 | HR 100 | Temp 97.9°F | Resp 20 | Ht 69.0 in | Wt 183.9 lb

## 2014-03-29 DIAGNOSIS — C163 Malignant neoplasm of pyloric antrum: Secondary | ICD-10-CM

## 2014-03-29 HISTORY — DX: Personal history of irradiation: Z92.3

## 2014-03-29 NOTE — Progress Notes (Signed)
  Radiation Oncology         [phone] ________________________________  Name: Melvin Gardner MRN: [id number]  Date: 02/27/2014  DOB: 10-12-1929  End of Treatment Note  Diagnosis:    Gastric cancer   Staging form: Gastric Stromal Tumor - Gastric Gist, AJCC 7th Edition     Clinical: T1, N0, M0 - Signed by [name], MD on 01/10/2014 Prostate cancer   Staging form: Prostate, AJCC 7th Edition     Clinical: Stage IV (TX, NX, M1b) - Signed by [name], MD on 12/08/2013      Indication for treatment:  Palliaitve       Radiation treatment dates:   01/15/14 - 02/27/14  Site/dose:   The patient received a 3D conformal radiation treatment to the stomach and surrounding high-risk region. He initially was treated to 50.4 Gy using a 4-field, 3D conformal technique. He then received a 4-field boost treatment for an additional 3.6 Gy. The total dose was 54.  Narrative: The patient tolerated radiation treatment relatively well.  No significant nausea.  Plan: The patient has completed radiation treatment. The patient will return to radiation oncology clinic for routine followup in one month. I advised the patient to call or return sooner if they have any questions or concerns related to their recovery or treatment. ________________________________  Jodelle Gross, M.D., Ph.D.

## 2014-03-29 NOTE — Addendum Note (Signed)
Encounter addended by: Marye Round, MD on: 03/29/2014 12:49 PM<BR>     Documentation filed: Notes Section

## 2014-03-29 NOTE — Progress Notes (Signed)
Follow up s/p rad tx gastric 01/15/14-02/23/14,   No nausea, still takes naps but not as much, in w/c, has cane, appetite good, still smokes marijuana ,last taken 3 eeks ago, no c/o pain 3:02 PM

## 2014-03-29 NOTE — Progress Notes (Signed)
  Radiation Oncology         [phone] ________________________________  Name: Melvin Gardner MRN: [id number]  Date: 01/12/2014  DOB: 1930/04/20  Simulation Verification Note   NARRATIVE: The patient was brought to the treatment unit and placed in the planned treatment position. The clinical setup was verified. Then port films were obtained and uploaded to the radiation oncology medical record software.  The treatment beams were carefully compared against the planned radiation fields. The position, location, and shape of the radiation fields was reviewed. The targeted volume of tissue appears to be appropriately covered by the radiation beams. Based on my personal review, I approved the simulation verification. The patient's treatment will proceed as planned.  ________________________________   Jodelle Gross, MD, PhD

## 2014-03-29 NOTE — Progress Notes (Signed)
  Radiation Oncology         [phone] ________________________________  Name: Melvin Gardner MRN: [id number]  Date: 02/19/2014  DOB: [birth year]  COMPLEX SIMULATION  NOTE  Diagnosis: gastric cancer  Narrative The patient has initially been planned to receive a course of radiation treatment to a dose of 50.4 gray in 28 fractions at 1.8 gray per fraction. The patient will now receive a boost to the high risk target volume for an additional 9 gray. This will be delivered in 3 fractions at 1.8 gray per fraction and a cone down boost technique will be utilized. To accomplish this, an additional 4 customized blocks have been designed for this purpose. A complex isodose plan is requested to ensure that the high-risk target region receives the appropriate radiation dose and that the nearby normal structures continue to be appropriately spared. The patient's final total dose therefore will be 54 gray.   ________________________________ ------------------------------------------------  Jodelle Gross, MD, PhD

## 2014-03-29 NOTE — Progress Notes (Signed)
Radiation Oncology         [phone] ________________________________  Name: ARCADIO COPE MRN: [id number]  Date: 03/29/2014  DOB: [birth year]  Follow-Up Visit Note  CC: [name]., MD  [name], MD  Diagnosis:    Gastric cancer   Staging form: Gastric Stromal Tumor - Gastric Gist, AJCC 7th Edition     Clinical: T1, N0, M0 - Signed by [name], MD on 01/10/2014 Prostate cancer   Staging form: Prostate, AJCC 7th Edition     Clinical: Stage IV (TX, NX, M1b) - Signed by [name], MD on 12/08/2013    Interval Since Last Radiation:  One month   Narrative:  The patient returns today for routine follow-up.  Follow up s/p rad tx gastric 01/15/14-02/23/14,   No nausea, still takes naps but not as much, in w/c, has cane, appetite good, still smokes marijuana ,last taken 3 weeks ago, no c/o pain                            ALLERGIES:  has No Known Allergies.  Meds: Current Outpatient Prescriptions  Medication Sig Dispense Refill  . albuterol (PROVENTIL HFA;VENTOLIN HFA) 108 (90 BASE) MCG/ACT inhaler Inhale 2 puffs into the lungs every 6 (six) hours as needed for wheezing.    [name] albuterol (PROVENTIL) (2.5 MG/3ML) 0.083% nebulizer solution Take 2.5 mg by nebulization every 6 (six) hours as needed for wheezing.    . bicalutamide (CASODEX) 50 MG tablet Take 50 mg by mouth daily.    [name] esomeprazole (NEXIUM) 40 MG packet Take 40 mg by mouth daily before breakfast. 30 each 0  . ferrous sulfate 325 (65 FE) MG tablet Take 325 mg by mouth daily with breakfast.    . Fluticasone-Salmeterol (ADVAIR) 250-50 MCG/DOSE AEPB Inhale 1 puff into the lungs every 12 (twelve) hours.    . hyaluronate sodium (RADIAPLEXRX) GEL Apply 1 application topically daily.    . metFORMIN (GLUCOPHAGE) 500 MG tablet Take 1,000 mg by mouth 2 (two) times daily with a meal.    . rosuvastatin (CRESTOR) 10 MG tablet Take 10 mg by mouth daily.    . sitaGLIPtin (JANUVIA) 50 MG tablet Take 50 mg by mouth  daily.    . sucralfate (CARAFATE) 1 G tablet Take 1 tablet (1 g total) by mouth 4 (four) times daily. 120 tablet 0  . tamsulosin (FLOMAX) 0.4 MG CAPS Take 0.4 mg by mouth daily after breakfast.    . latanoprost (XALATAN) 0.005 % ophthalmic solution      No current facility-administered medications for this encounter.    Physical Findings: The patient is in no acute distress. Patient is alert and oriented.  height is 5\' 9"  (1.753 m) and weight is 183 lb 14.4 oz (83.416 kg). His oral temperature is 97.9 F (36.6 C). His blood pressure is 96/65 and his pulse is 100. His respiration is 20 and oxygen saturation is 99%. .   General: Well-developed, in no acute distress HEENT: Normocephalic, atraumatic Cardiovascular: Regular rate and rhythm Respiratory: Clear to auscultation bilaterally GI: Soft, nontender, normal bowel sounds Extremities: No edema present   Lab Findings: Lab Results  Component Value Date   WBC 4.9 02/28/2014   HGB 10.8* 02/28/2014   HCT 34.0* 02/28/2014   MCV 84.2 02/28/2014   PLT 204 02/28/2014     Radiographic Findings: No results found.  Impression:    The patient is doing well with no  ongoing symptoms at this time. I'm very pleased with how the patient has done.  Plan:  He will follow-up in our clinic in 4 months. I will also place a referral back for follow-up with Dr. Collene Mares in GI medicine.   Jodelle Gross, M.D., Ph.D.

## 2014-03-30 ENCOUNTER — Telehealth: Payer: Self-pay | Admitting: Radiation Oncology

## 2014-03-30 NOTE — Telephone Encounter (Signed)
Confirmed with patient his appointment with Dr. Juanita Craver on 04/05/14 @ 9:45a. Patient was agreeable.

## 2014-05-29 ENCOUNTER — Other Ambulatory Visit (HOSPITAL_BASED_OUTPATIENT_CLINIC_OR_DEPARTMENT_OTHER): Payer: Medicare Other

## 2014-05-29 ENCOUNTER — Ambulatory Visit (HOSPITAL_BASED_OUTPATIENT_CLINIC_OR_DEPARTMENT_OTHER): Payer: Medicare Other | Admitting: Oncology

## 2014-05-29 ENCOUNTER — Telehealth: Payer: Self-pay | Admitting: Oncology

## 2014-05-29 VITALS — BP 130/80 | HR 91 | Temp 98.0°F | Resp 18 | Ht 69.0 in | Wt 181.2 lb

## 2014-05-29 DIAGNOSIS — C61 Malignant neoplasm of prostate: Secondary | ICD-10-CM

## 2014-05-29 DIAGNOSIS — D500 Iron deficiency anemia secondary to blood loss (chronic): Secondary | ICD-10-CM

## 2014-05-29 DIAGNOSIS — C169 Malignant neoplasm of stomach, unspecified: Secondary | ICD-10-CM

## 2014-05-29 DIAGNOSIS — C7951 Secondary malignant neoplasm of bone: Secondary | ICD-10-CM

## 2014-05-29 LAB — CBC WITH DIFFERENTIAL/PLATELET
BASO%: 0.3 % (ref 0.0–2.0)
Basophils Absolute: 0 10*3/uL (ref 0.0–0.1)
EOS%: 4.2 % (ref 0.0–7.0)
Eosinophils Absolute: 0.2 10*3/uL (ref 0.0–0.5)
HCT: 33.5 % — ABNORMAL LOW (ref 38.4–49.9)
HGB: 10.5 g/dL — ABNORMAL LOW (ref 13.0–17.1)
LYMPH%: 5.5 % — AB (ref 14.0–49.0)
MCH: 28.5 pg (ref 27.2–33.4)
MCHC: 31.4 g/dL — AB (ref 32.0–36.0)
MCV: 90.7 fL (ref 79.3–98.0)
MONO#: 0.5 10*3/uL (ref 0.1–0.9)
MONO%: 7.9 % (ref 0.0–14.0)
NEUT%: 82.1 % — AB (ref 39.0–75.0)
NEUTROS ABS: 4.7 10*3/uL (ref 1.5–6.5)
Platelets: 192 10*3/uL (ref 140–400)
RBC: 3.7 10*6/uL — AB (ref 4.20–5.82)
RDW: 13.1 % (ref 11.0–14.6)
WBC: 5.7 10*3/uL (ref 4.0–10.3)
lymph#: 0.3 10*3/uL — ABNORMAL LOW (ref 0.9–3.3)

## 2014-05-29 LAB — COMPREHENSIVE METABOLIC PANEL (CC13)
ALT: 11 U/L (ref 0–55)
ANION GAP: 5 meq/L (ref 3–11)
AST: 12 U/L (ref 5–34)
Albumin: 3.7 g/dL (ref 3.5–5.0)
Alkaline Phosphatase: 105 U/L (ref 40–150)
BILIRUBIN TOTAL: 0.38 mg/dL (ref 0.20–1.20)
BUN: 8.3 mg/dL (ref 7.0–26.0)
CHLORIDE: 109 meq/L (ref 98–109)
CO2: 28 meq/L (ref 22–29)
Calcium: 9 mg/dL (ref 8.4–10.4)
Creatinine: 0.7 mg/dL (ref 0.7–1.3)
EGFR: >90 mL/min/{1.73_m2} (ref >90)
Glucose: 120 mg/dl (ref 70–140)
Potassium: 4 mEq/L (ref 3.5–5.1)
SODIUM: 142 meq/L (ref 136–145)
TOTAL PROTEIN: 6.6 g/dL (ref 6.4–8.3)

## 2014-05-29 LAB — FERRITIN CHCC: Ferritin: 11 ng/ml — ABNORMAL LOW (ref 22–316)

## 2014-05-29 LAB — IRON AND TIBC CHCC
%SAT: 11 % — ABNORMAL LOW (ref 20–55)
Iron: 38 ug/dL — ABNORMAL LOW (ref 42–163)
TIBC: 348 ug/dL (ref 202–409)
UIBC: 310 ug/dL (ref 117–376)

## 2014-05-29 NOTE — Telephone Encounter (Signed)
gv adn printed appt sched and avs for pt for April  °

## 2014-05-29 NOTE — Progress Notes (Signed)
Hematology and Oncology Follow Up Visit  Melvin Gardner [id number] 1929-06-15 81 y.o. 05/29/2014 10:46 AM Melvin Gardner., MDShadad, Mathis Dad, MD   Principle Diagnosis: 54 year old gentleman with the following diagnoses:  1. Advanced prostate cancer hormone sensitive at this time he sees androgen depravation with Lupron and Casodex under the care of Dr. Janice Norrie.   2. Gastric cancer diagnosed in July of 2015 presented with acute GI bleed from an antral ulcer. His disease is localized to the stomach without any evidence of metastasis.  Past therapy: Radiation therapy for palliative purposes for his gastric cancer. Is not a candidate for surgical resection. This was completed in October of 2015.  Interim History:  Melvin Gardner presents today for a followup visit.  Since his last visit, he has recovered from radiation therapy and doing well. He does not report any abdominal pain or discomfort. He reports regular bowel movements that are brown in color. He does report some occasional fatigue and pain in his lower extremities. He still able to ambulate without any difficulty inside his house. He does not report any hematochezia or melena. He does report neck pain which is muscular in nature. He is not report any headaches or blurry vision or syncope. His performance status is limited but unchanged. He still lives independently and his niece checks on him periodically. He does not report any chest pain palpitations orthopnea or PND. Does not report any cough or dyspnea exertion or hemoptysis. He is not reporting frequency urgency or hesitancy. Does not report any skeletal pain back pain or shoulder pain or hip pain or any rib pain. He does not report any lymphadenopathy or petechiae or bleeding. He has not reported any anxiety or depression. The rest of review of review of systems unremarkable.    Medications: I have reviewed the patient's current medications.  Current Outpatient Prescriptions  Medication  Sig Dispense Refill  . albuterol (PROVENTIL HFA;VENTOLIN HFA) 108 (90 BASE) MCG/ACT inhaler Inhale 2 puffs into the lungs every 6 (six) hours as needed for wheezing.    [name] albuterol (PROVENTIL) (2.5 MG/3ML) 0.083% nebulizer solution Take 2.5 mg by nebulization every 6 (six) hours as needed for wheezing.    . bicalutamide (CASODEX) 50 MG tablet Take 50 mg by mouth daily.    [name] esomeprazole (NEXIUM) 40 MG packet Take 40 mg by mouth daily before breakfast. 30 each 0  . ferrous sulfate 325 (65 FE) MG tablet Take 325 mg by mouth daily with breakfast.    . Fluticasone-Salmeterol (ADVAIR) 250-50 MCG/DOSE AEPB Inhale 1 puff into the lungs every 12 (twelve) hours.    . hyaluronate sodium (RADIAPLEXRX) GEL Apply 1 application topically daily.    [name] latanoprost (XALATAN) 0.005 % ophthalmic solution     . metFORMIN (GLUCOPHAGE) 500 MG tablet Take 1,000 mg by mouth 2 (two) times daily with a meal.    . rosuvastatin (CRESTOR) 10 MG tablet Take 10 mg by mouth daily.    . sitaGLIPtin (JANUVIA) 50 MG tablet Take 50 mg by mouth daily.    . sucralfate (CARAFATE) 1 G tablet Take 1 tablet (1 g total) by mouth 4 (four) times daily. 120 tablet 0  . tamsulosin (FLOMAX) 0.4 MG CAPS Take 0.4 mg by mouth daily after breakfast.     No current facility-administered medications for this visit.     Allergies: No Known Allergies  Past Medical History, Surgical history, Social history, and Family History were reviewed and updated.  Physical Exam: Blood pressure 130/80, pulse 91,  temperature 98 F (36.7 C), temperature source Oral, resp. rate 18, height 5\' 9"  (1.753 m), weight 181 lb 3.2 oz (82.192 kg), SpO2 100 %. ECOG: 1 General appearance: alert and cooperative Head: Normocephalic, without obvious abnormality Neck: no adenopathy Lymph nodes: Cervical, supraclavicular, and axillary nodes normal. Heart:regular rate and rhythm, S1, S2 normal, no murmur, click, rub or gallop Lung:chest clear, no wheezing, rales, normal  symmetric air entry Abdomin: soft, non-tender, without masses or organomegaly EXT:no erythema, induration, or nodules   Lab Results: Lab Results  Component Value Date   WBC 5.7 05/29/2014   HGB 10.5* 05/29/2014   HCT 33.5* 05/29/2014   MCV 90.7 05/29/2014   PLT 192 05/29/2014     Chemistry      Component Value Date/Time   NA 144 02/28/2014 1059   NA 143 11/23/2013 1410   K 3.3* 02/28/2014 1059   K 4.0 11/23/2013 1410   CL 106 11/23/2013 1410   CO2 25 02/28/2014 1059   CO2 23 11/23/2013 1410   BUN 7.3 02/28/2014 1059   BUN 13 11/23/2013 1410   CREATININE 0.7 02/28/2014 1059   CREATININE 0.58 11/23/2013 1410      Component Value Date/Time   CALCIUM 9.4 02/28/2014 1059   CALCIUM 9.0 11/23/2013 1410   ALKPHOS 64 02/28/2014 1059   ALKPHOS 71 01/23/2007 0811   AST 10 02/28/2014 1059   AST 18 01/23/2007 0811   ALT 8 02/28/2014 1059   ALT 17 01/23/2007 0811   BILITOT 0.77 02/28/2014 1059   BILITOT 1.0 01/23/2007 0811     Impression and Plan:  81 year old gentleman with the following issues:  1. Gastric cancer presented with acute GI bleeding and an antral ulcer. He status post an endoscopy and a biopsy. His PET CT scan obtained on 12/26/2013 did not show any evidence of lymphadenopathy or disease outside of his stomach. There is some metabolic activity in the left rib and vertebral body likely related to his prostate cancer. He is a candidate for any surgical resection and completed palliative radiation therapy.   The plan is to repeat imaging studies in 3 months to assess for disease outside the gastric area.  2. Advanced prostate cancer: He appears to be hormone sensitive at this time. He is continuing on Lupron and Casodex per Dr. Janice Norrie and he does have metastatic bony disease. I will repeat his PSA was a staging workup in 3 months.  3. Anemia: This is related to his GI blood loss. His hemoglobin is stable and does not require any transfusions. I recommended continue oral  iron.          [facility], MD 1/12/201610:46 AM

## 2014-06-19 ENCOUNTER — Other Ambulatory Visit: Payer: Self-pay | Admitting: Gastroenterology

## 2014-08-16 ENCOUNTER — Encounter (HOSPITAL_COMMUNITY): Payer: Self-pay

## 2014-08-16 ENCOUNTER — Ambulatory Visit (HOSPITAL_COMMUNITY): Admit: 2014-08-16 | Payer: Self-pay | Admitting: Gastroenterology

## 2014-08-16 SURGERY — COLONOSCOPY WITH PROPOFOL
Anesthesia: Monitor Anesthesia Care

## 2014-08-16 SURGERY — ESOPHAGOGASTRODUODENOSCOPY (EGD) WITH PROPOFOL
Anesthesia: Monitor Anesthesia Care

## 2014-08-28 ENCOUNTER — Telehealth: Payer: Self-pay | Admitting: *Deleted

## 2014-08-28 ENCOUNTER — Other Ambulatory Visit: Payer: Self-pay | Admitting: *Deleted

## 2014-08-28 ENCOUNTER — Telehealth: Payer: Self-pay | Admitting: Oncology

## 2014-08-28 ENCOUNTER — Ambulatory Visit (HOSPITAL_COMMUNITY): Admission: RE | Admit: 2014-08-28 | Payer: Medicare Other | Source: Ambulatory Visit

## 2014-08-28 ENCOUNTER — Encounter: Payer: Self-pay | Admitting: *Deleted

## 2014-08-28 ENCOUNTER — Other Ambulatory Visit (HOSPITAL_BASED_OUTPATIENT_CLINIC_OR_DEPARTMENT_OTHER): Payer: Medicare Other

## 2014-08-28 DIAGNOSIS — C61 Malignant neoplasm of prostate: Secondary | ICD-10-CM

## 2014-08-28 DIAGNOSIS — C169 Malignant neoplasm of stomach, unspecified: Secondary | ICD-10-CM

## 2014-08-28 DIAGNOSIS — D500 Iron deficiency anemia secondary to blood loss (chronic): Secondary | ICD-10-CM | POA: Diagnosis not present

## 2014-08-28 LAB — CBC WITH DIFFERENTIAL/PLATELET
BASO%: 0.1 % (ref 0.0–2.0)
Basophils Absolute: 0 10*3/uL (ref 0.0–0.1)
EOS ABS: 0.1 10*3/uL (ref 0.0–0.5)
EOS%: 1.5 % (ref 0.0–7.0)
HEMATOCRIT: 34.3 % — AB (ref 38.4–49.9)
HGB: 11 g/dL — ABNORMAL LOW (ref 13.0–17.1)
LYMPH%: 3.9 % — AB (ref 14.0–49.0)
MCH: 28.8 pg (ref 27.2–33.4)
MCHC: 32.1 g/dL (ref 32.0–36.0)
MCV: 89.8 fL (ref 79.3–98.0)
MONO#: 0.5 10*3/uL (ref 0.1–0.9)
MONO%: 6.4 % (ref 0.0–14.0)
NEUT%: 88.1 % — AB (ref 39.0–75.0)
NEUTROS ABS: 7.1 10*3/uL — AB (ref 1.5–6.5)
PLATELETS: 218 10*3/uL (ref 140–400)
RBC: 3.82 10*6/uL — ABNORMAL LOW (ref 4.20–5.82)
RDW: 14.5 % (ref 11.0–14.6)
WBC: 8 10*3/uL (ref 4.0–10.3)
lymph#: 0.3 10*3/uL — ABNORMAL LOW (ref 0.9–3.3)

## 2014-08-28 LAB — COMPREHENSIVE METABOLIC PANEL (CC13)
ALT: 8 U/L (ref 0–55)
ANION GAP: 10 meq/L (ref 3–11)
AST: 11 U/L (ref 5–34)
Albumin: 3.7 g/dL (ref 3.5–5.0)
Alkaline Phosphatase: 198 U/L — ABNORMAL HIGH (ref 40–150)
BILIRUBIN TOTAL: 0.39 mg/dL (ref 0.20–1.20)
BUN: 10.9 mg/dL (ref 7.0–26.0)
CO2: 22 mEq/L (ref 22–29)
CREATININE: 0.7 mg/dL (ref 0.7–1.3)
Calcium: 9 mg/dL (ref 8.4–10.4)
Chloride: 107 mEq/L (ref 98–109)
Glucose: 156 mg/dl — ABNORMAL HIGH (ref 70–140)
Potassium: 4.1 mEq/L (ref 3.5–5.1)
SODIUM: 140 meq/L (ref 136–145)
TOTAL PROTEIN: 6.7 g/dL (ref 6.4–8.3)

## 2014-08-28 NOTE — Telephone Encounter (Signed)
Melvin Gardner called reporting they have rescheduled PET for first available 09-06-2014 because he ate today.  Asked if F/U visit for 08-30-2014 with Dr. [name] needs to be rescheduled.  Will notify Dr. [name] and contact patient with schedule changes.

## 2014-08-28 NOTE — Telephone Encounter (Signed)
Follow up with MD needs to be rescheduled.

## 2014-08-28 NOTE — Progress Notes (Signed)
pof to schedulers to cancel 08/30/14 dr visit and r/s dr visit to after pet scan on 09/06/14.

## 2014-08-28 NOTE — Telephone Encounter (Signed)
Confirm appointment for May. Mailed calendar.

## 2014-08-29 LAB — PSA: PSA: 32.01 ng/mL — ABNORMAL HIGH (ref <=4.00)

## 2014-08-30 ENCOUNTER — Ambulatory Visit: Payer: Medicare Other | Admitting: Oncology

## 2014-09-06 ENCOUNTER — Encounter (HOSPITAL_COMMUNITY)
Admission: RE | Admit: 2014-09-06 | Discharge: 2014-09-06 | Disposition: A | Discharge status: Home / Self Care | Payer: Medicare Other | Source: Ambulatory Visit | Attending: Oncology | Admitting: Oncology

## 2014-09-06 DIAGNOSIS — C169 Malignant neoplasm of stomach, unspecified: Secondary | ICD-10-CM | POA: Insufficient documentation

## 2014-09-06 DIAGNOSIS — Z8546 Personal history of malignant neoplasm of prostate: Secondary | ICD-10-CM | POA: Insufficient documentation

## 2014-09-06 DIAGNOSIS — C61 Malignant neoplasm of prostate: Secondary | ICD-10-CM

## 2014-09-06 LAB — GLUCOSE, CAPILLARY: Glucose-Capillary: 105 mg/dL — ABNORMAL HIGH (ref 70–99)

## 2014-09-06 MED ORDER — FLUDEOXYGLUCOSE F - 18 (FDG) INJECTION
8.9000 | Freq: Once | INTRAVENOUS | Status: AC | PRN
  Administered 2014-09-06: 8.9 via INTRAVENOUS

## 2014-09-18 ENCOUNTER — Ambulatory Visit (HOSPITAL_BASED_OUTPATIENT_CLINIC_OR_DEPARTMENT_OTHER): Payer: Medicare Other | Admitting: Oncology

## 2014-09-18 ENCOUNTER — Telehealth: Payer: Self-pay | Admitting: Oncology

## 2014-09-18 VITALS — BP 97/63 | HR 74 | Temp 98.3°F | Resp 18 | Ht 69.0 in | Wt 176.0 lb

## 2014-09-18 DIAGNOSIS — C169 Malignant neoplasm of stomach, unspecified: Secondary | ICD-10-CM | POA: Diagnosis not present

## 2014-09-18 DIAGNOSIS — C61 Malignant neoplasm of prostate: Secondary | ICD-10-CM

## 2014-09-18 DIAGNOSIS — D500 Iron deficiency anemia secondary to blood loss (chronic): Secondary | ICD-10-CM

## 2014-09-18 NOTE — Telephone Encounter (Signed)
per pof to sch pt appt-gave pt copy of sch °

## 2014-09-18 NOTE — Progress Notes (Signed)
Hematology and Oncology Follow Up Visit  Melvin Gardner [id number] [birth year] 81 y.o. 09/18/2014 9:31 AM Melvin Gardner., MDShelton, Joelene Millin, MD   Principle Diagnosis: 81 year old gentleman with the following diagnoses:  1. Advanced prostate cancer hormone sensitive at this time he sees androgen depravation with Lupron and Casodex under the care of Dr. Janice Norrie.   2. Gastric cancer diagnosed in July of 2015 presented with acute GI bleed from an antral ulcer. His disease is localized to the stomach without any evidence of metastasis.  Past therapy: Radiation therapy for palliative purposes for his gastric cancer. Is not a candidate for surgical resection. This was completed in October of 2015.  Interim History:  Melvin Gardner presents today for a followup visit.  Since his last visit, he reports no complaints. He does not report any abdominal pain or discomfort. He reports regular bowel movements that are brown in color without any hematochezia or melena. He does report some occasional fatigue and pain in his lower extremities. He still able to ambulate without any difficulty inside his house but at times feels that he needs a walker.  He does report neck pain which is muscular in nature.   He is not report any headaches or blurry vision or syncope. His performance status is limited but unchanged. He still lives independently and his niece checks on him periodically. He does not report any chest pain palpitations orthopnea or PND. Does not report any cough or dyspnea exertion or hemoptysis. He is not reporting frequency urgency or hesitancy. Does not report any skeletal pain back pain or shoulder pain or hip pain or any rib pain. He does not report any lymphadenopathy or petechiae or bleeding. He has not reported any anxiety or depression. The rest of review of review of systems unremarkable.    Medications: I have reviewed the patient's current medications.  Current Outpatient Prescriptions   Medication Sig Dispense Refill  . albuterol (PROVENTIL HFA;VENTOLIN HFA) 108 (90 BASE) MCG/ACT inhaler Inhale 2 puffs into the lungs every 6 (six) hours as needed for wheezing.    [name] albuterol (PROVENTIL) (2.5 MG/3ML) 0.083% nebulizer solution Take 2.5 mg by nebulization every 6 (six) hours as needed for wheezing.    . bicalutamide (CASODEX) 50 MG tablet Take 50 mg by mouth daily.    [name] esomeprazole (NEXIUM) 40 MG packet Take 40 mg by mouth daily before breakfast. 30 each 0  . ferrous sulfate 325 (65 FE) MG tablet Take 325 mg by mouth daily with breakfast.    . Fluticasone-Salmeterol (ADVAIR) 250-50 MCG/DOSE AEPB Inhale 1 puff into the lungs every 12 (twelve) hours.    . hyaluronate sodium (RADIAPLEXRX) GEL Apply 1 application topically daily.    [name] latanoprost (XALATAN) 0.005 % ophthalmic solution     . metFORMIN (GLUCOPHAGE) 500 MG tablet Take 1,000 mg by mouth 2 (two) times daily with a meal.    . rosuvastatin (CRESTOR) 10 MG tablet Take 10 mg by mouth daily.    . sitaGLIPtin (JANUVIA) 50 MG tablet Take 50 mg by mouth daily.    . sucralfate (CARAFATE) 1 G tablet Take 1 tablet (1 g total) by mouth 4 (four) times daily. 120 tablet 0  . tamsulosin (FLOMAX) 0.4 MG CAPS Take 0.4 mg by mouth daily after breakfast.     No current facility-administered medications for this visit.     Allergies: No Known Allergies  Past Medical History, Surgical history, Social history, and Family History were reviewed and updated.  Physical Exam: Blood pressure  97/63, pulse 74, temperature 98.3 F (36.8 C), temperature source Oral, resp. rate 18, height 5\' 9"  (1.753 m), weight 176 lb (79.833 kg), SpO2 99 %. ECOG: 1 General appearance: alert and cooperative appeared in no active distress. Head: Normocephalic, without obvious abnormality Neck: no adenopathy Lymph nodes: Cervical, supraclavicular, and axillary nodes normal. Heart:regular rate and rhythm, S1, S2 normal, no murmur, click, rub or  gallop Lung:chest clear, no wheezing, rales, normal symmetric air entry Abdomin: soft, non-tender, without masses or organomegaly EXT:no erythema, induration, or nodules   Lab Results: Lab Results  Component Value Date   WBC 8.0 08/28/2014   HGB 11.0* 08/28/2014   HCT 34.3* 08/28/2014   MCV 89.8 08/28/2014   PLT 218 08/28/2014     Chemistry      Component Value Date/Time   NA 140 08/28/2014 0903   NA 143 11/23/2013 1410   K 4.1 08/28/2014 0903   K 4.0 11/23/2013 1410   CL 106 11/23/2013 1410   CO2 22 08/28/2014 0903   CO2 23 11/23/2013 1410   BUN 10.9 08/28/2014 0903   BUN 13 11/23/2013 1410   CREATININE 0.7 08/28/2014 0903   CREATININE 0.58 11/23/2013 1410      Component Value Date/Time   CALCIUM 9.0 08/28/2014 0903   CALCIUM 9.0 11/23/2013 1410   ALKPHOS 198* 08/28/2014 0903   ALKPHOS 71 01/23/2007 0811   AST 11 08/28/2014 0903   AST 18 01/23/2007 0811   ALT 8 08/28/2014 0903   ALT 17 01/23/2007 0811   BILITOT 0.39 08/28/2014 0903   BILITOT 1.0 01/23/2007 0811      FINDINGS: NECK  No hypermetabolic lymph nodes in the neck.  CHEST  No hypermetabolic mediastinal or hilar nodes. No suspicious pulmonary nodules on the CT scan.  ABDOMEN/PELVIS  No abnormal hypermetabolic activity within the liver, pancreas, adrenal glands, or spleen. No hypermetabolic lymph nodes in the abdomen or pelvis. No abnormal FDG activity in the stomach is identified.  SKELETON  Diffuse sclerotic osseous metastatic disease, consistent with treated prostate cancer. The metabolic activity noted in T8 on the prior study has decreased from SUV max of 5.3 to 2.8. Activity in the left fifth rib has also decreased from 2.7 to 2.0. No new lesions.  IMPRESSION: 1. Stable diffuse sclerotic bone lesions consistent with treated prostate cancer. The metabolic activity noted in T8 and the left fifth rib has decreased. No new lesions are identified. 2. No abnormal FDG uptake  associated with the stomach and no abdominal lymphadenopathy     Impression and Plan:  81 year old gentleman with the following issues:  1. Gastric cancer presented with acute GI bleeding and an antral ulcer. He status post an endoscopy and a biopsy. His PET CT scan obtained on 09/06/2014 was discussed today and did not show any evidence of lymphadenopathy or disease outside of his stomach. There is some metabolic activity in the left rib and vertebral body likely related to his prostate cancer.   The plan is to continue active surveillance at the time being but a follow-up visit in 4 months and potentially repeat imaging studies in 8 months.  2. Advanced prostate cancer: He appears to be hormone sensitive at this time. He is continuing on Lupron and Casodex per Dr. Janice Norrie and he does have metastatic bony disease. His PSA was up to 32 on 08/28/2014 and we will need to continue to monitor that. I will repeat that with a repeat testosterone level with the next visit. He continues to have  a rise in his PSA with castration level testosterone we will consider a second line hormonal therapy.  3. Anemia: This is related to his GI blood loss. His hemoglobin is stable and does not require any transfusions. I recommended continue oral iron.          [facility], MD 5/3/20169:31 AM

## 2014-12-03 ENCOUNTER — Encounter (HOSPITAL_COMMUNITY): Payer: Self-pay | Admitting: Emergency Medicine

## 2014-12-03 ENCOUNTER — Emergency Department (HOSPITAL_COMMUNITY): Payer: Medicare Other

## 2014-12-03 ENCOUNTER — Inpatient Hospital Stay (HOSPITAL_COMMUNITY)
Admission: EM | Admit: 2014-12-03 | Discharge: 2014-12-04 | DRG: 194 | Disposition: A | Discharge status: Home / Self Care | Payer: Medicare Other | Attending: Internal Medicine | Admitting: Internal Medicine

## 2014-12-03 DIAGNOSIS — Z8546 Personal history of malignant neoplasm of prostate: Secondary | ICD-10-CM

## 2014-12-03 DIAGNOSIS — J449 Chronic obstructive pulmonary disease, unspecified: Secondary | ICD-10-CM | POA: Diagnosis present

## 2014-12-03 DIAGNOSIS — J45909 Unspecified asthma, uncomplicated: Secondary | ICD-10-CM | POA: Diagnosis present

## 2014-12-03 DIAGNOSIS — Z87891 Personal history of nicotine dependence: Secondary | ICD-10-CM

## 2014-12-03 DIAGNOSIS — E7800 Pure hypercholesterolemia, unspecified: Secondary | ICD-10-CM

## 2014-12-03 DIAGNOSIS — E872 Acidosis, unspecified: Secondary | ICD-10-CM | POA: Diagnosis present

## 2014-12-03 DIAGNOSIS — M1990 Unspecified osteoarthritis, unspecified site: Secondary | ICD-10-CM | POA: Diagnosis present

## 2014-12-03 DIAGNOSIS — J189 Pneumonia, unspecified organism: Principal | ICD-10-CM | POA: Diagnosis present

## 2014-12-03 DIAGNOSIS — Z85028 Personal history of other malignant neoplasm of stomach: Secondary | ICD-10-CM

## 2014-12-03 DIAGNOSIS — Z923 Personal history of irradiation: Secondary | ICD-10-CM

## 2014-12-03 DIAGNOSIS — D649 Anemia, unspecified: Secondary | ICD-10-CM | POA: Diagnosis present

## 2014-12-03 DIAGNOSIS — E119 Type 2 diabetes mellitus without complications: Secondary | ICD-10-CM

## 2014-12-03 DIAGNOSIS — R079 Chest pain, unspecified: Secondary | ICD-10-CM | POA: Diagnosis present

## 2014-12-03 DIAGNOSIS — E785 Hyperlipidemia, unspecified: Secondary | ICD-10-CM | POA: Diagnosis present

## 2014-12-03 DIAGNOSIS — R9431 Abnormal electrocardiogram [ECG] [EKG]: Secondary | ICD-10-CM | POA: Diagnosis present

## 2014-12-03 DIAGNOSIS — H409 Unspecified glaucoma: Secondary | ICD-10-CM | POA: Diagnosis present

## 2014-12-03 DIAGNOSIS — J439 Emphysema, unspecified: Secondary | ICD-10-CM | POA: Diagnosis present

## 2014-12-03 LAB — URINALYSIS, ROUTINE W REFLEX MICROSCOPIC
BILIRUBIN URINE: NEGATIVE
Glucose, UA: NEGATIVE mg/dL
Ketones, ur: NEGATIVE mg/dL
LEUKOCYTES UA: NEGATIVE
NITRITE: NEGATIVE
PROTEIN: NEGATIVE mg/dL
SPECIFIC GRAVITY, URINE: 1.009 (ref 1.005–1.030)
Urobilinogen, UA: 1 mg/dL (ref 0.0–1.0)
pH: 6 (ref 5.0–8.0)

## 2014-12-03 LAB — LACTIC ACID, PLASMA: LACTIC ACID, VENOUS: 2.3 mmol/L — AB (ref 0.5–2.0)

## 2014-12-03 LAB — CBC
HEMATOCRIT: 30.1 % — AB (ref 39.0–52.0)
HEMOGLOBIN: 9.3 g/dL — AB (ref 13.0–17.0)
MCH: 27.1 pg (ref 26.0–34.0)
MCHC: 30.9 g/dL (ref 30.0–36.0)
MCV: 87.8 fL (ref 78.0–100.0)
Platelets: 203 10*3/uL (ref 150–400)
RBC: 3.43 MIL/uL — ABNORMAL LOW (ref 4.22–5.81)
RDW: 15.8 % — ABNORMAL HIGH (ref 11.5–15.5)
WBC: 5.5 10*3/uL (ref 4.0–10.5)

## 2014-12-03 LAB — TROPONIN I: Troponin I: <0.03 ng/mL (ref <0.031)

## 2014-12-03 LAB — GLUCOSE, CAPILLARY
GLUCOSE-CAPILLARY: 128 mg/dL — AB (ref 65–99)
Glucose-Capillary: 144 mg/dL — ABNORMAL HIGH (ref 65–99)
Glucose-Capillary: 104 mg/dL — ABNORMAL HIGH (ref 65–99)
Glucose-Capillary: 142 mg/dL — ABNORMAL HIGH (ref 65–99)

## 2014-12-03 LAB — I-STAT CG4 LACTIC ACID, ED: Lactic Acid, Venous: 3.21 mmol/L (ref 0.5–2.0)

## 2014-12-03 LAB — URINE MICROSCOPIC-ADD ON

## 2014-12-03 LAB — MAGNESIUM: Magnesium: 1.9 mg/dL (ref 1.7–2.4)

## 2014-12-03 LAB — BASIC METABOLIC PANEL
Anion gap: 8 (ref 5–15)
BUN: 7 mg/dL (ref 6–20)
CO2: 24 mmol/L (ref 22–32)
Calcium: 8.7 mg/dL — ABNORMAL LOW (ref 8.9–10.3)
Chloride: 108 mmol/L (ref 101–111)
Creatinine, Ser: 0.59 mg/dL — ABNORMAL LOW (ref 0.61–1.24)
GFR calc non Af Amer: >60 mL/min (ref >60)
GLUCOSE: 110 mg/dL — AB (ref 65–99)
POTASSIUM: 3.6 mmol/L (ref 3.5–5.1)
Sodium: 140 mmol/L (ref 135–145)

## 2014-12-03 LAB — I-STAT TROPONIN, ED: Troponin i, poc: 0 ng/mL (ref 0.00–0.08)

## 2014-12-03 LAB — STREP PNEUMONIAE URINARY ANTIGEN: Strep Pneumo Urinary Antigen: NEGATIVE

## 2014-12-03 MED ORDER — ESOMEPRAZOLE MAGNESIUM 40 MG PO PACK: 40.0000 mg | PACK | Freq: Every day | ORAL | Status: DC

## 2014-12-03 MED ORDER — ACETAMINOPHEN 650 MG RE SUPP: 650.0000 mg | Freq: Four times a day (QID) | RECTAL | Status: DC | PRN

## 2014-12-03 MED ORDER — ENOXAPARIN SODIUM 40 MG/0.4ML ~~LOC~~ SOLN
40.0000 mg | SUBCUTANEOUS | Status: DC
  Administered 2014-12-03 – 2014-12-04 (×2): 40 mg via SUBCUTANEOUS
  Filled 2014-12-03 (×2): qty 0.4

## 2014-12-03 MED ORDER — ALBUTEROL SULFATE (2.5 MG/3ML) 0.083% IN NEBU
2.5000 mg | INHALATION_SOLUTION | RESPIRATORY_TRACT | Status: DC | PRN
  Administered 2014-12-03 (×2): 2.5 mg via RESPIRATORY_TRACT
  Filled 2014-12-03 (×2): qty 3

## 2014-12-03 MED ORDER — CEFTRIAXONE SODIUM IN DEXTROSE 20 MG/ML IV SOLN
1.0000 g | INTRAVENOUS | Status: DC
  Administered 2014-12-03 – 2014-12-04 (×2): 1 g via INTRAVENOUS
  Filled 2014-12-03 (×2): qty 50

## 2014-12-03 MED ORDER — INSULIN ASPART 100 UNIT/ML ~~LOC~~ SOLN
0.0000 [IU] | Freq: Three times a day (TID) | SUBCUTANEOUS | Status: DC
  Administered 2014-12-03: 1 [IU] via SUBCUTANEOUS

## 2014-12-03 MED ORDER — SODIUM CHLORIDE 0.9 % IV BOLUS (SEPSIS)
1000.0000 mL | Freq: Once | INTRAVENOUS | Status: AC
  Administered 2014-12-03: 1000 mL via INTRAVENOUS

## 2014-12-03 MED ORDER — ROSUVASTATIN CALCIUM 10 MG PO TABS
10.0000 mg | ORAL_TABLET | Freq: Every day | ORAL | Status: DC
  Administered 2014-12-03 – 2014-12-04 (×2): 10 mg via ORAL
  Filled 2014-12-03 (×2): qty 1

## 2014-12-03 MED ORDER — ACETAMINOPHEN 325 MG PO TABS: 650.0000 mg | ORAL_TABLET | Freq: Four times a day (QID) | ORAL | Status: DC | PRN

## 2014-12-03 MED ORDER — TAMSULOSIN HCL 0.4 MG PO CAPS
0.4000 mg | ORAL_CAPSULE | Freq: Every day | ORAL | Status: DC
  Administered 2014-12-03 – 2014-12-04 (×2): 0.4 mg via ORAL
  Filled 2014-12-03 (×3): qty 1

## 2014-12-03 MED ORDER — FERROUS SULFATE 325 (65 FE) MG PO TABS
325.0000 mg | ORAL_TABLET | Freq: Every day | ORAL | Status: DC
  Administered 2014-12-04: 325 mg via ORAL
  Filled 2014-12-03 (×3): qty 1

## 2014-12-03 MED ORDER — DEXTROSE 5 % IV SOLN
500.0000 mg | Freq: Once | INTRAVENOUS | Status: AC
  Administered 2014-12-03: 500 mg via INTRAVENOUS
  Filled 2014-12-03: qty 500

## 2014-12-03 MED ORDER — SODIUM CHLORIDE 0.9 % IV BOLUS (SEPSIS): 1000.0000 mL | Freq: Once | INTRAVENOUS | Status: DC

## 2014-12-03 MED ORDER — SODIUM CHLORIDE 0.9 % IV SOLN
INTRAVENOUS | Status: AC
  Administered 2014-12-03: 10:00:00 via INTRAVENOUS

## 2014-12-03 MED ORDER — MOMETASONE FURO-FORMOTEROL FUM 100-5 MCG/ACT IN AERO
2.0000 | INHALATION_SPRAY | Freq: Two times a day (BID) | RESPIRATORY_TRACT | Status: DC
  Filled 2014-12-03: qty 8.8

## 2014-12-03 MED ORDER — SODIUM CHLORIDE 0.9 % IJ SOLN
3.0000 mL | Freq: Two times a day (BID) | INTRAMUSCULAR | Status: DC
Start: 2014-12-03 — End: 2014-12-04
  Administered 2014-12-03 – 2014-12-04 (×3): 3 mL via INTRAVENOUS

## 2014-12-03 MED ORDER — DEXTROSE 5 % IV SOLN
1.0000 g | Freq: Once | INTRAVENOUS | Status: DC
  Filled 2014-12-03: qty 10

## 2014-12-03 MED ORDER — BICALUTAMIDE 50 MG PO TABS
50.0000 mg | ORAL_TABLET | Freq: Every day | ORAL | Status: DC
  Administered 2014-12-03 – 2014-12-04 (×2): 50 mg via ORAL
  Filled 2014-12-03 (×2): qty 1

## 2014-12-03 MED ORDER — SODIUM CHLORIDE 0.9 % IV SOLN
INTRAVENOUS | Status: DC
  Administered 2014-12-03: 06:00:00 via INTRAVENOUS

## 2014-12-03 MED ORDER — MORPHINE SULFATE 2 MG/ML IJ SOLN: 1.0000 mg | INTRAMUSCULAR | Status: DC | PRN

## 2014-12-03 MED ORDER — OXYCODONE HCL 5 MG PO TABS: 5.0000 mg | ORAL_TABLET | ORAL | Status: DC | PRN

## 2014-12-03 MED ORDER — MOMETASONE FURO-FORMOTEROL FUM 100-5 MCG/ACT IN AERO
2.0000 | INHALATION_SPRAY | Freq: Two times a day (BID) | RESPIRATORY_TRACT | Status: DC
  Administered 2014-12-03: 2 via RESPIRATORY_TRACT
  Filled 2014-12-03 (×3): qty 8.8

## 2014-12-03 MED ORDER — ESOMEPRAZOLE MAGNESIUM 40 MG PO CPDR
40.0000 mg | DELAYED_RELEASE_CAPSULE | Freq: Every day | ORAL | Status: DC
  Administered 2014-12-03: 40 mg via ORAL
  Filled 2014-12-03 (×3): qty 1

## 2014-12-03 MED ORDER — NITROGLYCERIN 0.4 MG SL SUBL
0.4000 mg | SUBLINGUAL_TABLET | SUBLINGUAL | Status: DC | PRN
  Administered 2014-12-03: 0.4 mg via SUBLINGUAL
  Filled 2014-12-03: qty 1

## 2014-12-03 MED ORDER — IPRATROPIUM-ALBUTEROL 0.5-2.5 (3) MG/3ML IN SOLN
3.0000 mL | Freq: Once | RESPIRATORY_TRACT | Status: AC
Start: 2014-12-03 — End: 2014-12-03
  Administered 2014-12-03: 3 mL via RESPIRATORY_TRACT
  Filled 2014-12-03: qty 3

## 2014-12-03 MED ORDER — INSULIN ASPART 100 UNIT/ML ~~LOC~~ SOLN
3.0000 [IU] | Freq: Three times a day (TID) | SUBCUTANEOUS | Status: DC
  Administered 2014-12-03 – 2014-12-04 (×3): 3 [IU] via SUBCUTANEOUS

## 2014-12-03 MED ORDER — AZITHROMYCIN 500 MG PO TABS
500.0000 mg | ORAL_TABLET | ORAL | Status: DC
  Administered 2014-12-03 – 2014-12-04 (×2): 500 mg via ORAL
  Filled 2014-12-03 (×2): qty 1

## 2014-12-03 MED ORDER — ALUM & MAG HYDROXIDE-SIMETH 200-200-20 MG/5ML PO SUSP: 30.0000 mL | Freq: Four times a day (QID) | ORAL | Status: DC | PRN

## 2014-12-03 NOTE — ED Notes (Addendum)
[facility] reports that they were called to pt home for c/o "racing heart rate and chest pain."  On scene pt reported pain 7-10 however denied any pain once at the hospital. Denies any N/V, was given 324 of ASP.  Reports dizziness when he moves, vitals: BP 122/70, Pulse 86, Resp 16, CBG 103.  "It work me up out of my sleep"

## 2014-12-03 NOTE — Progress Notes (Signed)
PRN neb tx given. Pts vitals stable sats 100 on RA, BBS CLR DIM.  Pt is in no distress.  Pt requesting Albuterol MDI at bedside.

## 2014-12-03 NOTE — Progress Notes (Signed)
Per patient's friends and patient himself,stated that he hed fallen yesterday morning.He was trying to sit on his stool/chair,missed it,loos his balanced and fell to the floor hitting the back of his head on the floor.Nurse asked patient if he hit other part of his body on the floor,patient said ,he fell on his back too.He was not feeling dizzy and he did not loss his concious when he fell,thought patient's friends noticed some memory lapse.Nurse felt slight tenderness on the back of his head and patient said he felt soreness on the back of his head.He complained also that his both nipples is hurting him slightly.M.D.made aware,responded,no orders received.

## 2014-12-03 NOTE — ED Notes (Signed)
Pt requesting neb treatment; lung sounds clear

## 2014-12-03 NOTE — ED Notes (Addendum)
Provider bedside.

## 2014-12-03 NOTE — ED Notes (Signed)
Pt to Xray.

## 2014-12-03 NOTE — Progress Notes (Signed)
Received report from ED.  Room ready.  [employer] RN-BC, WTA.

## 2014-12-03 NOTE — ED Provider Notes (Signed)
TIME SEEN: 5:00 AM  CHIEF COMPLAINT: Chest pain, shortness of breath  HPI: Pt is a 81 y.o. male with history of diabetes, hyperlipidemia, previous history of tobacco use, COPD, prostate cancer who presents to the emergency department with complaints of left-sided chest pain that he is unable to describe that woke him from sleep. Was initially severe in nature but is now gone. Also felt like his heart was racing and felt short of breath and lightheaded. Denies nausea or diaphoresis. States that his symptoms have almost completely resolved prior to arrival in the emergency department. He states he has never had similar symptoms before. He tried taking a breathing treatment but this did not help. Describes having very minimal dull left-sided chest pain now without radiation. No aggravating or relieving factors. Is not sure if he has ever had a stress test. Denies history of cardiac catheterization. Does not have a cardiologist. PCP is Dr. Karlton Lemon.  ROS: See HPI Constitutional: no fever  Eyes: no drainage  ENT: no runny nose   Cardiovascular:   chest pain  Resp:  SOB  GI: no vomiting GU: no dysuria Integumentary: no rash  Allergy: no hives  Musculoskeletal: no leg swelling  Neurological: no slurred speech ROS otherwise negative  PAST MEDICAL HISTORY/PAST SURGICAL HISTORY:  Past Medical History  Diagnosis Date  . Asthma   . High cholesterol   . Type II diabetes mellitus   . Anemia   . Arthritis     "left knee" (11/23/2013)  . COPD (chronic obstructive pulmonary disease)     [name] 09/17/2010  . Prostate cancer 06/03/2001    bilat involvement,adenocarcinoma,gleason 6(3+3) PSA=37.6  . Stomach cancer 11/24/13    adenocarcinoma   . History of blood transfusion 11/23/2013    related to lower GI bleeding  . History of radiation therapy 03/28/2003-05/25/2003    Prostate  . Glaucoma   . Hx of radiation therapy 01/15/14-109/15    gastric cancer    MEDICATIONS:  Prior to Admission medications    Medication Sig Start Date End Date Taking? Authorizing Provider  albuterol (PROVENTIL HFA;VENTOLIN HFA) 108 (90 BASE) MCG/ACT inhaler Inhale 2 puffs into the lungs every 6 (six) hours as needed for wheezing.    Historical Provider, MD  albuterol (PROVENTIL) (2.5 MG/3ML) 0.083% nebulizer solution Take 2.5 mg by nebulization every 6 (six) hours as needed for wheezing.    Historical Provider, MD  bicalutamide (CASODEX) 50 MG tablet Take 50 mg by mouth daily. 11/16/13   Historical Provider, MD  esomeprazole (NEXIUM) 40 MG packet Take 40 mg by mouth daily before breakfast. 11/25/13   [name], MD  ferrous sulfate 325 (65 FE) MG tablet Take 325 mg by mouth daily with breakfast.    Historical Provider, MD  Fluticasone-Salmeterol (ADVAIR) 250-50 MCG/DOSE AEPB Inhale 1 puff into the lungs every 12 (twelve) hours.    Historical Provider, MD  hyaluronate sodium (RADIAPLEXRX) GEL Apply 1 application topically daily. 01/16/14   Kyung Rudd, MD  latanoprost (XALATAN) 0.005 % ophthalmic solution  09/07/13   Historical Provider, MD  metFORMIN (GLUCOPHAGE) 500 MG tablet Take 1,000 mg by mouth 2 (two) times daily with a meal.    Historical Provider, MD  rosuvastatin (CRESTOR) 10 MG tablet Take 10 mg by mouth daily.    Historical Provider, MD  sitaGLIPtin (JANUVIA) 50 MG tablet Take 50 mg by mouth daily.    Historical Provider, MD  sucralfate (CARAFATE) 1 G tablet Take 1 tablet (1 g total) by mouth 4 (four)  times daily. 11/25/13   [name], MD  tamsulosin (FLOMAX) 0.4 MG CAPS Take 0.4 mg by mouth daily after breakfast.    Historical Provider, MD    ALLERGIES:  No Known Allergies  SOCIAL HISTORY:  History  Substance Use Topics  . Smoking status: Former Smoker -- 1.00 packs/day for 5 years    Types: Cigarettes  . Smokeless tobacco: Never Used     Comment: "quit smoking in the 1950's"  . Alcohol Use: No     Comment: 11/23/2013 "don't drink anything now; never was a drinker" quit drugs years ago    FAMILY  HISTORY: Family History  Problem Relation Age of Onset  . Diabetes Mellitus II Mother   . Other Father     EXAM: BP 121/72 mmHg  Pulse 72  Temp(Src) 98.1 F (36.7 C) (Oral)  Resp 19  SpO2 98% CONSTITUTIONAL: Alert and oriented and responds appropriately to questions. Well-appearing; well-nourished HEAD: Normocephalic EYES: Conjunctivae clear, PERRL ENT: normal nose; no rhinorrhea; moist mucous membranes; pharynx without lesions noted NECK: Supple, no meningismus, no LAD  CARD: RRR; S1 and S2 appreciated; no murmurs, no clicks, no rubs, no gallops RESP: Normal chest excursion without splinting or tachypnea; breath sounds clear and equal bilaterally; no wheezes, no rhonchi, no rales, no hypoxia or respiratory distress, speaking full sentences ABD/GI: Normal bowel sounds; non-distended; soft, non-tender, no rebound, no guarding, no peritoneal signs BACK:  The back appears normal and is non-tender to palpation, there is no CVA tenderness EXT: Normal ROM in all joints; non-tender to palpation; no edema; normal capillary refill; no cyanosis, no calf tenderness or swelling    SKIN: Normal color for age and race; warm NEURO: Moves all extremities equally, sensation to light touch intact diffusely, cranial nerves II through XII intact PSYCH: The patient's mood and manner are appropriate. Grooming and personal hygiene are appropriate.  MEDICAL DECISION MAKING: Patient here with chest pain, shortness of breath and dizziness that woke him from sleep. Received aspirin with EMS. His pain is now almost completely gone. EKG shows nonspecific T-wave changes compared to prior. Troponin negative.  Chest x-ray shows mid left lung airspace opacity concerning for pneumonia. He denies any recent fevers, chills or cough and has no leukocytosis but does have an elevated lactate. No recent admission. Will treat for community-acquired pneumonia. Will obtain blood cultures and admit.   6:14 AM  D/w Dr. Alcario Drought  for admission to inpatient, telemetry bed.    EKG Interpretation  Date/Time:  Monday December 03 2014 04:48:24 EDT Ventricular Rate:  72 PR Interval:  136 QRS Duration: 81 QT Interval:  499 QTC Calculation: 546 R Axis:   41 Text Interpretation:  Sinus arrhythmia Multiple premature complexes, vent  Low voltage, precordial leads Nonspecific T abnrm, anterolateral leads Prolonged QT interval Confirmed by Jaquavian Firkus,  DO, Kenechukwu Eckstein ([id number]) on 12/03/2014 4:57:33 AM         [place], DO 12/03/14 [id number]

## 2014-12-03 NOTE — H&P (Signed)
[facility] History and Physical  Melvin Gardner Melvin Gardner DOB: 02-05-1930 DOA: 12/03/2014  Referring physician: Dr. Leonides Schanz PCP: [name]., MD   Chief Complaint: Chest Pain  HPI: Melvin Gardner is a 81 y.o. male from home alone with T2DM, COPD, and stomach and prostate cancer that was treated, who presented to the ED with left sided chest pain. Patient reports he awoke at 0300 this morning (12/03/14) and "felt funny". He felt nauseated and had some dull, 4/10 left sided chest pain located in the axilla and left lateral side of his chest with some localized tingling. No aggravating or alleviating factors.Though he was not having any SOB, he used his nebulizer which provided no change in condition. He was brought to the ED via EMS. Patient denies any pain on inspiration, dysphagia, cough, congestion, recent illness, abdominal pain, weakness, dizziness, lightheadedness. No recent falls. He felt normal upon going to sleep last night.   Per ED notes, patient reported pain was initially severe but has since subsided and also reports his heart was racing. He was feeling SOB and lightheaded upon initial onset.  Lactic acid: 3.21, CXR: Left middle lobe pneumonia.    Review of Systems:  Constitutional: No weight loss, night sweats, Fevers, chills, fatigue.  HEENT: No headaches, Difficulty swallowing, Sore throat,  No sneezing, itching, ear ache, nasal congestion, post nasal drip,  Cardio-vascular:  ++ let sided chest pain, No Orthopnea, PND, swelling in lower extremities, anasarca, dizziness, palpitations  GI: ++ nausea, No heartburn, indigestion, abdominal pain, vomiting, diarrhea, change in bowel habits, loss of appetite  Resp: No shortness of breath with exertion or at rest. No excess mucus, no productive cough, No non-productive cough, No coughing up of blood.No change in color of mucus.No wheezing.No chest wall deformity  Skin: no rash or lesions.  GU: ++ darker urine, no blood  in urine, no dysuria, no urgency or frequency. No flank pain.  Musculoskeletal:  No joint pain or swelling. No decreased range of motion. No back pain.  Psych: No change in mood or affect. No depression or anxiety.   Past Medical History  Diagnosis Date  . Asthma   . High cholesterol   . Type II diabetes mellitus   . Anemia   . Arthritis     "left knee" (11/23/2013)  . COPD (chronic obstructive pulmonary disease)     [name] 09/17/2010  . Prostate cancer 06/03/2001    bilat involvement,adenocarcinoma,gleason 6(3+3) PSA=37.6  . Stomach cancer 11/24/13    adenocarcinoma   . History of blood transfusion 11/23/2013    related to lower GI bleeding  . History of radiation therapy 03/28/2003-05/25/2003    Prostate  . Glaucoma   . Hx of radiation therapy 01/15/14-109/15    gastric cancer   Past Surgical History  Procedure Laterality Date  . Tumor excision  10 years ago    "off my spinal cord"  . Hemorrhoid surgery  09/2000    [name] 09/30/2010  . Cardiac catheterization      [name] 09/30/2010  . Esophagogastroduodenoscopy Left 11/24/2013    Procedure: ESOPHAGOGASTRODUODENOSCOPY (EGD);  Surgeon: Juanita Craver, MD;  Location[facility] ENDOSCOPY;  Service: Endoscopy;  Laterality: Left;   Social History:  reports that he has quit smoking. His smoking use included Cigarettes. He has a 5 pack-year smoking history. He has never used smokeless tobacco. He reports that he does not drink alcohol or use illicit drugs. Patient reports using marijuana 2-3 times per week for the past 30-40 years.  He  currently lives at home by himself and is visited by a home health nurse. He has never driven. He uses a cane to walk when he is out of the house. He is able to cook and take care of himself.   No Known Allergies  Family History  Problem Relation Age of Onset  . Diabetes Mellitus II Mother   . Other Father     Prior to Admission medications   Medication Sig Start Date End Date Taking? Authorizing Provider    albuterol (PROVENTIL HFA;VENTOLIN HFA) 108 (90 BASE) MCG/ACT inhaler Inhale 2 puffs into the lungs every 6 (six) hours as needed for wheezing.   Yes Historical Provider, MD  albuterol (PROVENTIL) (2.5 MG/3ML) 0.083% nebulizer solution Take 2.5 mg by nebulization every 6 (six) hours as needed for wheezing.   Yes Historical Provider, MD  bicalutamide (CASODEX) 50 MG tablet Take 50 mg by mouth daily. 11/16/13  Yes Historical Provider, MD  esomeprazole (NEXIUM) 40 MG packet Take 40 mg by mouth daily before breakfast. 11/25/13  Yes [name], MD  ferrous sulfate 325 (65 FE) MG tablet Take 325 mg by mouth daily with breakfast.   Yes Historical Provider, MD  Fluticasone-Salmeterol (ADVAIR) 250-50 MCG/DOSE AEPB Inhale 1 puff into the lungs every 12 (twelve) hours.   Yes Historical Provider, MD  metFORMIN (GLUCOPHAGE) 500 MG tablet Take 1,000 mg by mouth 2 (two) times daily with a meal.   Yes Historical Provider, MD  rosuvastatin (CRESTOR) 10 MG tablet Take 10 mg by mouth daily.   Yes Historical Provider, MD  sitaGLIPtin (JANUVIA) 50 MG tablet Take 50 mg by mouth daily.   Yes Historical Provider, MD  tamsulosin (FLOMAX) 0.4 MG CAPS Take 0.4 mg by mouth daily after breakfast.   Yes Historical Provider, MD  sucralfate (CARAFATE) 1 G tablet Take 1 tablet (1 g total) by mouth 4 (four) times daily. Patient not taking: Reported on 12/03/2014 11/25/13   [name], MD   Physical Exam: Filed Vitals:   12/03/14 0600 12/03/14 0615 12/03/14 0630 12/03/14 0645  BP: 102/64 107/56 117/63 110/66  Pulse: 78 80 81 75  Temp:      TempSrc:      Resp: 12 19 20 21   SpO2: 100% 96% 95% 97%    Wt Readings from Last 3 Encounters:  09/18/14 79.833 kg (176 lb)  05/29/14 82.192 kg (181 lb 3.2 oz)  03/29/14 83.416 kg (183 lb 14.4 oz)    General:  Appears calm and comfortable, seated on edge of bed with no difficulty breathing. Patient is very pleasant and interacts appropriately.   Eyes: PERRL, normal lids, irises &  conjunctiva ENT: grossly normal hearing, lips & tongue Neck: no LAD, masses or thyromegaly Cardiovascular: RRR, no m/r/g. No LE edema. 2+ DP/PT pulses. Respiratory: CTA bilaterally, no w/r/r. ++Decreased lung sounds in bases bilaterally. Normal respiratory effort.  Abdomen: Bowel sounds present, soft, ntnd Skin: no rash or induration seen on limited exam Musculoskeletal: grossly normal tone BUE/BLE. 5/5 strength BUE. Psychiatric: grossly normal mood and affect, speech fluent and appropriate. ++May have some mild memory loss. Neurologic: grossly non-focal.          Labs on Admission:  Basic Metabolic Panel:  Recent Labs Lab 12/03/14 0450  NA 140  K 3.6  CL 108  CO2 24  GLUCOSE 110*  BUN 7  CREATININE 0.59*  CALCIUM 8.7*   CBC:  Recent Labs Lab 12/03/14 0450  WBC 5.5  HGB 9.3*  HCT 30.1*  MCV 87.8  PLT 203    Radiological Exams on Admission: Dg Chest 2 View  12/03/2014   CLINICAL DATA:  Acute onset of left upper chest pain and shortness of breath. Initial encounter.  EXAM: CHEST  2 VIEW  COMPARISON:  Chest radiograph performed 10/31/2012, and PET/CT performed 09/06/2014  FINDINGS: The lungs are well-aerated. Mild left midlung airspace opacity raises concern for mild pneumonia. There is no evidence of pleural effusion or pneumothorax.  The heart is normal in size; the mediastinal contour is within normal limits. No acute osseous abnormalities are seen.  IMPRESSION: Mild left midlung airspace opacity raises concern for mild pneumonia. Followup PA and lateral chest X-ray is recommended in 3-4 weeks following trial of antibiotic therapy to ensure resolution and exclude underlying malignancy.   Electronically Signed   By: Garald Balding M.D.   On: 12/03/2014 05:30    EKG: Independently reviewed. Sinus arrhythmia, prolonged QTc, occasional PVCs, Non-specific T wave abnormalities with T wave inversion in V2, V4, V5 when compared to previous EKG on  10/31/12  Assessment/Plan Active Problems:   Diabetes mellitus, type 2   CAP (community acquired pneumonia)   Anemia   Chest pain   COPD with emphysema   High cholesterol   Prolonged Q-T interval on ECG   Lactic acidosis   Nonspecific abnormal electrocardiogram (ECG) (EKG)   1. CAP (Community Acquired Pneumonia) - Left sided chest pain/axillary pain - Let middle lob opacity concerning for pneumonia  -  Azithromycin and ceftriaxone started on 12/03/14 - Sputum culture, Urine for legionella and strep pneumo pending. - F/u outpatient CXR in 3-4 weeks  2. Chest pain - Left sided chest pain/axillary pain - POC Trop negative, no signs of acute infarction/ischemia on EKG - Pain likely due to CAP - Nitroglycerin PRN - Cycle troponin, EKG showed mild abnormality - will repeat at 12 noon.  3. Lactic Acidosis - Elevated lactic acid at 3.21, no fever, tachypnea, tachycardia, or leukocytosis. - received 2L of NS in the ER will check Lactic acid again at 10 am. - No anion gap - No concern for sepsis at this time  - IVF and monitor lactic acid  4. Anemia - Hbg 9.3, Hct 30.1 on 12/03/14 - Previous: Hgb 11.0 Hct 34.3 on 05/29/14 - Check guaiac - Monitor Hgb and Hct.  If guiac is negative will work up outpatient.  5. T2DM - Not insulin dependent - Hold home medications, SSI sensitive, meal coverage  - Monitor CBG  6. COPD, chronic no exacerbation - Chronic COPD, non-oxygen dependent - Nebulizer and albuterol inhaler PRN  7. Hyperlipidemia - Continue rosuvastatin   Code Status: Full DVT Prophylaxis: Lovenox, ambulate Family Communication: Patient Disposition Plan: Admit to telemetry, likely discharge in 48-72 hours.  Time spent: [address] PA-S Imogene Burn, [place] [facility] Pager [phone]

## 2014-12-04 DIAGNOSIS — R079 Chest pain, unspecified: Secondary | ICD-10-CM

## 2014-12-04 DIAGNOSIS — J189 Pneumonia, unspecified organism: Principal | ICD-10-CM

## 2014-12-04 LAB — BASIC METABOLIC PANEL
ANION GAP: 5 (ref 5–15)
BUN: <5 mg/dL — ABNORMAL LOW (ref 6–20)
CHLORIDE: 109 mmol/L (ref 101–111)
CO2: 25 mmol/L (ref 22–32)
Calcium: 8.6 mg/dL — ABNORMAL LOW (ref 8.9–10.3)
Creatinine, Ser: 0.46 mg/dL — ABNORMAL LOW (ref 0.61–1.24)
GFR calc Af Amer: >60 mL/min (ref >60)
GFR calc non Af Amer: >60 mL/min (ref >60)
Glucose, Bld: 122 mg/dL — ABNORMAL HIGH (ref 65–99)
POTASSIUM: 3.4 mmol/L — AB (ref 3.5–5.1)
SODIUM: 139 mmol/L (ref 135–145)

## 2014-12-04 LAB — CBC
HEMATOCRIT: 30.2 % — AB (ref 39.0–52.0)
HEMOGLOBIN: 9.6 g/dL — AB (ref 13.0–17.0)
MCH: 27.7 pg (ref 26.0–34.0)
MCHC: 31.8 g/dL (ref 30.0–36.0)
MCV: 87 fL (ref 78.0–100.0)
Platelets: 189 10*3/uL (ref 150–400)
RBC: 3.47 MIL/uL — AB (ref 4.22–5.81)
RDW: 15.8 % — ABNORMAL HIGH (ref 11.5–15.5)
WBC: 7.1 10*3/uL (ref 4.0–10.5)

## 2014-12-04 LAB — GLUCOSE, CAPILLARY
Glucose-Capillary: 120 mg/dL — ABNORMAL HIGH (ref 65–99)
Glucose-Capillary: 116 mg/dL — ABNORMAL HIGH (ref 65–99)

## 2014-12-04 LAB — HEMOGLOBIN A1C
HEMOGLOBIN A1C: 5.7 % — AB (ref 4.8–5.6)
Mean Plasma Glucose: 117 mg/dL

## 2014-12-04 MED ORDER — LEVOFLOXACIN 750 MG PO TABS: 750.0000 mg | ORAL_TABLET | Freq: Every day | ORAL | Status: DC

## 2014-12-04 NOTE — Evaluation (Signed)
Physical Therapy Evaluation Patient Details Name: Melvin Gardner MRN: [id number] DOB: 22-Mar-1930 Today's Date: 12/04/2014   History of Present Illness  Melvin Gardner is a 81 y.o. male from home alone with T2DM, COPD, and stomach and prostate cancer that was treated, who presented to the ED with left sided chest pain. Found to have L lingular pneumonia  Clinical Impression  Patient evaluated by Physical Therapy with no further acute PT needs identified, as he will likely dc home today per Dr. Jerilee Hoh;  All education has been completed and the patient has no further questions.   Pt does report a fall from a chair approx 2 weeks ago, with intermittent vertiginous episodes (lasting less than 30 seconds) since; He will benefit from Outpatient PT for gait and balance and a closer Vestibular Screen; Dr. Jerilee Hoh aware;   See below for any follow-up Physical Therapy or equipment needs. PT is signing off. Thank you for this referral.     Follow Up Recommendations Outpatient PT;Other (comment) (for Vestibular Assessment post fall )    Equipment Recommendations  Other (comment) (Pt reports he will consider RW if Outpt PT recommends)    Recommendations for Other Services       Precautions / Restrictions Precautions Precautions: Fall Precaution Comments: Fall risk greatly reduced with UE support; Pt does report intermittently experiencing vertigo-like symptoms; not elicited during this exam      Mobility  Bed Mobility                  Transfers Overall transfer level: Modified independent               General transfer comment: Noted dependence on UE push for lift and balance once standing  Ambulation/Gait Ambulation/Gait assistance: Supervision;Modified independent (Device/Increase time) Ambulation Distance (Feet): 250 Feet Assistive device:  (pushing Dinamap) Gait Pattern/deviations: Step-to pattern;Trunk flexed     General Gait Details: Noted he is dependent on  Unilateral UE support; tended to reach out for UE support  Stairs            Wheelchair Mobility    Modified Rankin (Stroke Patients Only)       Balance Overall balance assessment: Needs assistance             Standing balance comment: Benefits from at least unilateral UE support                             Pertinent Vitals/Pain Pain Assessment: No/denies pain    Home Living Family/patient expects to be discharged to:: Private residence Living Arrangements: Alone Available Help at Discharge: Family;Friend(s) Type of Home: Independent living facility Home Access: Level entry     Home Layout: Two level Home Equipment: Cane - single point      Prior Function Level of Independence: Independent with assistive device(s)         Comments: Uses cane full-time     Hand Dominance        Extremity/Trunk Assessment   Upper Extremity Assessment: Overall WFL for tasks assessed           Lower Extremity Assessment: Generalized weakness      Cervical / Trunk Assessment: Normal  Communication   Communication: No difficulties  Cognition Arousal/Alertness: Awake/alert Behavior During Therapy: WFL for tasks assessed/performed Overall Cognitive Status: Within Functional Limits for tasks assessed  General Comments      Exercises        Assessment/Plan    PT Assessment All further PT needs can be met in the next venue of care  PT Diagnosis Generalized weakness;Other (comment) (Vertigo episodes)   PT Problem List Decreased strength;Decreased mobility  PT Treatment Interventions     PT Goals (Current goals can be found in the Care Plan section) Acute Rehab PT Goals Patient Stated Goal: REALLY wants to go home today PT Goal Formulation: All assessment and education complete, DC therapy    Frequency     Barriers to discharge        Co-evaluation               End of Session   Activity  Tolerance: Patient tolerated treatment well Patient left: in bed;with call bell/phone within reach;with bed alarm set Nurse Communication: Mobility status         Time: 3685-9923 PT Time Calculation (min) (ACUTE ONLY): 26 min   Charges:   PT Evaluation $Initial PT Evaluation Tier I: 1 Procedure PT Treatments $Gait Training: 8-22 mins   PT G Codes:        Quin Hoop 12/04/2014, 10:37 AM  Roney Marion, [place] Pager [phone] Office [phone]

## 2014-12-04 NOTE — Discharge Summary (Signed)
Physician Discharge Summary  Melvin Gardner VEH:[id number] DOB: May 23, 1929 DOA: 12/03/2014  PCP: [name]., MD  Admit date: 12/03/2014 Discharge date: 12/04/2014  Time spent: 45 minutes  Recommendations for Outpatient Follow-up:  -We discharge him today. -Levaquin for 7 days for treatment of community-acquired pneumonia. -Advised to follow-up with primary care provider in 2 weeks, consider repeat chest suture in 4-6 weeks to evaluate complete resolution of pneumonia.   Discharge Diagnoses:  Active Problems:   Diabetes mellitus, type 2   CAP (community acquired pneumonia)   Anemia   Chest pain   COPD with emphysema   High cholesterol   Prolonged Q-T interval on ECG   Lactic acidosis   Nonspecific abnormal electrocardiogram (ECG) (EKG)   Discharge Condition: Stable and improved  There were no vitals filed for this visit.  History of present illness:  Melvin Gardner is a 81 y.o. male from home alone with T2DM, COPD, and stomach and prostate cancer that was treated, who presented to the ED with left sided chest pain. Patient reports he awoke at 0300 this morning (12/03/14) and "felt funny". He felt nauseated and had some dull, 4/10 left sided chest pain located in the axilla and left lateral side of his chest with some localized tingling. No aggravating or alleviating factors.Though he was not having any SOB, he used his nebulizer which provided no change in condition. He was brought to the ED via EMS. Patient denies any pain on inspiration, dysphagia, cough, congestion, recent illness, abdominal pain, weakness, dizziness, lightheadedness. No recent falls. He felt normal upon going to sleep last night.   Per ED notes, patient reported pain was initially severe but has since subsided and also reports his heart was racing. He was feeling SOB and lightheaded upon initial onset. Lactic acid: 3.21, CXR: Left middle lobe pneumonia.   Hospital Course:   Community-acquired  pneumonia -Has been afebrile, leukocytosis resolved, is not oxygen dependent. -Received 24 hours of Rocephin and a Septra, will discharge him on Levaquin to complete 7 days of treatment. -Consider repeating chest x-ray in 4-6 weeks to evaluate resolution of pneumonia.  Chest pain -Likely related to pneumonia, has ruled out for acute coronary syndrome.  Lactic acidosis -Likely related to pneumonia, no full blown sepsis.  COPD -Chronic, no exacerbation, at baseline.  Hyperlipidemia -Continue statin.  Type 2 diabetes -Well-controlled.  Procedures:  None   Consultations:  None  Discharge Instructions  Discharge Instructions    Diet - low sodium heart healthy    Complete by:  As directed      Increase activity slowly    Complete by:  As directed             Medication List    TAKE these medications        albuterol 108 (90 BASE) MCG/ACT inhaler  Commonly known as:  PROVENTIL HFA;VENTOLIN HFA  Inhale 2 puffs into the lungs every 6 (six) hours as needed for wheezing.     albuterol (2.5 MG/3ML) 0.083% nebulizer solution  Commonly known as:  PROVENTIL  Take 2.5 mg by nebulization every 6 (six) hours as needed for wheezing.     bicalutamide 50 MG tablet  Commonly known as:  CASODEX  Take 50 mg by mouth daily.     esomeprazole 40 MG packet  Commonly known as:  NEXIUM  Take 40 mg by mouth daily before breakfast.     ferrous sulfate 325 (65 FE) MG tablet  Take 325 mg by mouth  daily with breakfast.     Fluticasone-Salmeterol 250-50 MCG/DOSE Aepb  Commonly known as:  ADVAIR  Inhale 1 puff into the lungs every 12 (twelve) hours.     levofloxacin 750 MG tablet  Commonly known as:  LEVAQUIN  Take 1 tablet (750 mg total) by mouth daily.     metFORMIN 500 MG tablet  Commonly known as:  GLUCOPHAGE  Take 1,000 mg by mouth 2 (two) times daily with a meal.     rosuvastatin 10 MG tablet  Commonly known as:  CRESTOR  Take 10 mg by mouth daily.     sitaGLIPtin 50 MG  tablet  Commonly known as:  JANUVIA  Take 50 mg by mouth daily.     tamsulosin 0.4 MG Caps capsule  Commonly known as:  FLOMAX  Take 0.4 mg by mouth daily after breakfast.       No Known Allergies     Follow-up Information    Follow up with [name]., MD. Schedule an appointment as soon as possible for a visit in 2 weeks.   Specialty:  Internal Medicine   Contact information:   [address] [place] [address] [phone]        The results of significant diagnostics from this hospitalization (including imaging, microbiology, ancillary and laboratory) are listed below for reference.    Significant Diagnostic Studies: Dg Chest 2 View  12/03/2014   CLINICAL DATA:  Acute onset of left upper chest pain and shortness of breath. Initial encounter.  EXAM: CHEST  2 VIEW  COMPARISON:  Chest radiograph performed 10/31/2012, and PET/CT performed 09/06/2014  FINDINGS: The lungs are well-aerated. Mild left midlung airspace opacity raises concern for mild pneumonia. There is no evidence of pleural effusion or pneumothorax.  The heart is normal in size; the mediastinal contour is within normal limits. No acute osseous abnormalities are seen.  IMPRESSION: Mild left midlung airspace opacity raises concern for mild pneumonia. Followup PA and lateral chest X-ray is recommended in 3-4 weeks following trial of antibiotic therapy to ensure resolution and exclude underlying malignancy.   Electronically Signed   By: Garald Balding M.D.   On: 12/03/2014 05:30    Microbiology: Recent Results (from the past 240 hour(s))  Blood culture (routine x 2)     Status: None (Preliminary result)   Collection Time: 12/03/14  5:45 AM  Result Value Ref Range Status   Specimen Description BLOOD LEFT ANTECUBITAL  Final   Special Requests BOTTLES DRAWN AEROBIC AND ANAEROBIC 5CC EA  Final   Culture PENDING  Incomplete   Report Status PENDING  Incomplete  Blood culture (routine x 2)      Status: None (Preliminary result)   Collection Time: 12/03/14  5:55 AM  Result Value Ref Range Status   Specimen Description BLOOD RIGHT ANTECUBITAL  Final   Special Requests BOTTLES DRAWN AEROBIC AND ANAEROBIC 10CC EA  Final   Culture PENDING  Incomplete   Report Status PENDING  Incomplete     Labs: Basic Metabolic Panel:  Recent Labs Lab 12/03/14 0450 12/04/14 0526  NA 140 139  K 3.6 3.4*  CL 108 109  CO2 24 25  GLUCOSE 110* 122*  BUN 7 <5*  CREATININE 0.59* 0.46*  CALCIUM 8.7* 8.6*  MG 1.9  --    Liver Function Tests: No results for input(s): AST, ALT, ALKPHOS, BILITOT, PROT, ALBUMIN in the last 168 hours. No results for input(s): LIPASE, AMYLASE in the last 168 hours. No results for input(s):  AMMONIA in the last 168 hours. CBC:  Recent Labs Lab 12/03/14 0450 12/04/14 0526  WBC 5.5 7.1  HGB 9.3* 9.6*  HCT 30.1* 30.2*  MCV 87.8 87.0  PLT 203 189   Cardiac Enzymes:  Recent Labs Lab 12/03/14 1129 12/03/14 1531 12/03/14 2212  TROPONINI <0.03 <0.03 <0.03   BNP: BNP (last 3 results) No results for input(s): BNP in the last 8760 hours.  ProBNP (last 3 results) No results for input(s): PROBNP in the last 8760 hours.  CBG:  Recent Labs Lab 12/03/14 0729 12/03/14 1135 12/03/14 1648 12/03/14 2030 12/04/14 0747  GLUCAP 128* 144* 104* 142* 120*       Signed:  HERNANDEZ ACOSTA,ESTELA  [facility] Pager: [phone] 12/04/2014, 10:09 AM

## 2014-12-04 NOTE — Care Management Note (Signed)
Case Management Note  Patient Details  Name: Melvin Gardner MRN: [id number] Date of Birth: 1930-04-18  Subjective/Objective:       CM following for progression and d/c planning.             Action/Plan: No d/c needs identified, pt will d/c to home.   Expected Discharge Date:       12/04/2014           Expected Discharge Plan:  Home/Self Care  In-House Referral:  NA  Discharge planning Services  NA  Post Acute Care Choice:  NA Choice offered to:  NA  DME Arranged:    DME Agency:     HH Arranged:    HH Agency:     Status of Service:  Completed, signed off  Medicare Important Message Given:    Date Medicare IM Given:    Medicare IM give by:    Date Additional Medicare IM Given:    Additional Medicare Important Message give by:     If discussed at [place] of Stay Meetings, dates discussed:    Additional Comments:  Adron Bene, RN 12/04/2014, 10:44 AM

## 2014-12-05 LAB — URINE CULTURE: Culture: NO GROWTH

## 2014-12-05 LAB — LEGIONELLA ANTIGEN, URINE

## 2014-12-08 LAB — CULTURE, BLOOD (ROUTINE X 2)
Culture: NO GROWTH
Culture: NO GROWTH

## 2015-01-14 ENCOUNTER — Other Ambulatory Visit: Payer: Self-pay | Admitting: Internal Medicine

## 2015-01-14 ENCOUNTER — Ambulatory Visit
Admission: RE | Admit: 2015-01-14 | Discharge: 2015-01-14 | Disposition: A | Discharge status: Home / Self Care | Payer: Medicare Other | Source: Ambulatory Visit | Attending: Internal Medicine | Admitting: Internal Medicine

## 2015-01-14 DIAGNOSIS — J45909 Unspecified asthma, uncomplicated: Secondary | ICD-10-CM

## 2015-01-15 ENCOUNTER — Other Ambulatory Visit: Payer: Self-pay | Admitting: Internal Medicine

## 2015-01-15 DIAGNOSIS — N62 Hypertrophy of breast: Secondary | ICD-10-CM

## 2015-03-13 ENCOUNTER — Inpatient Hospital Stay (HOSPITAL_COMMUNITY)
Admission: EM | Admit: 2015-03-13 | Discharge: 2015-03-16 | DRG: 379 | Disposition: A | Discharge status: Home / Self Care | Payer: Medicare Other | Attending: Internal Medicine | Admitting: Internal Medicine

## 2015-03-13 ENCOUNTER — Emergency Department (HOSPITAL_COMMUNITY): Payer: Medicare Other

## 2015-03-13 ENCOUNTER — Encounter (HOSPITAL_COMMUNITY): Payer: Self-pay | Admitting: Emergency Medicine

## 2015-03-13 DIAGNOSIS — D649 Anemia, unspecified: Secondary | ICD-10-CM

## 2015-03-13 DIAGNOSIS — Z79899 Other long term (current) drug therapy: Secondary | ICD-10-CM | POA: Diagnosis not present

## 2015-03-13 DIAGNOSIS — K254 Chronic or unspecified gastric ulcer with hemorrhage: Secondary | ICD-10-CM | POA: Diagnosis not present

## 2015-03-13 DIAGNOSIS — C169 Malignant neoplasm of stomach, unspecified: Secondary | ICD-10-CM | POA: Diagnosis present

## 2015-03-13 DIAGNOSIS — K31811 Angiodysplasia of stomach and duodenum with bleeding: Principal | ICD-10-CM | POA: Diagnosis present

## 2015-03-13 DIAGNOSIS — Z7982 Long term (current) use of aspirin: Secondary | ICD-10-CM | POA: Diagnosis not present

## 2015-03-13 DIAGNOSIS — R600 Localized edema: Secondary | ICD-10-CM | POA: Insufficient documentation

## 2015-03-13 DIAGNOSIS — E7800 Pure hypercholesterolemia, unspecified: Secondary | ICD-10-CM | POA: Diagnosis present

## 2015-03-13 DIAGNOSIS — J45909 Unspecified asthma, uncomplicated: Secondary | ICD-10-CM | POA: Diagnosis present

## 2015-03-13 DIAGNOSIS — C61 Malignant neoplasm of prostate: Secondary | ICD-10-CM | POA: Diagnosis present

## 2015-03-13 DIAGNOSIS — D500 Iron deficiency anemia secondary to blood loss (chronic): Secondary | ICD-10-CM | POA: Diagnosis present

## 2015-03-13 DIAGNOSIS — M13862 Other specified arthritis, left knee: Secondary | ICD-10-CM | POA: Diagnosis present

## 2015-03-13 DIAGNOSIS — E119 Type 2 diabetes mellitus without complications: Secondary | ICD-10-CM | POA: Diagnosis present

## 2015-03-13 DIAGNOSIS — F1290 Cannabis use, unspecified, uncomplicated: Secondary | ICD-10-CM | POA: Diagnosis present

## 2015-03-13 DIAGNOSIS — Z85028 Personal history of other malignant neoplasm of stomach: Secondary | ICD-10-CM

## 2015-03-13 DIAGNOSIS — E785 Hyperlipidemia, unspecified: Secondary | ICD-10-CM | POA: Diagnosis present

## 2015-03-13 DIAGNOSIS — Z7984 Long term (current) use of oral hypoglycemic drugs: Secondary | ICD-10-CM

## 2015-03-13 DIAGNOSIS — K922 Gastrointestinal hemorrhage, unspecified: Secondary | ICD-10-CM | POA: Diagnosis present

## 2015-03-13 DIAGNOSIS — J449 Chronic obstructive pulmonary disease, unspecified: Secondary | ICD-10-CM | POA: Diagnosis present

## 2015-03-13 DIAGNOSIS — Z87891 Personal history of nicotine dependence: Secondary | ICD-10-CM | POA: Diagnosis not present

## 2015-03-13 DIAGNOSIS — H409 Unspecified glaucoma: Secondary | ICD-10-CM | POA: Diagnosis present

## 2015-03-13 DIAGNOSIS — D696 Thrombocytopenia, unspecified: Secondary | ICD-10-CM | POA: Diagnosis present

## 2015-03-13 DIAGNOSIS — R0602 Shortness of breath: Secondary | ICD-10-CM | POA: Diagnosis present

## 2015-03-13 DIAGNOSIS — Y842 Radiological procedure and radiotherapy as the cause of abnormal reaction of the patient, or of later complication, without mention of misadventure at the time of the procedure: Secondary | ICD-10-CM | POA: Diagnosis present

## 2015-03-13 DIAGNOSIS — K5521 Angiodysplasia of colon with hemorrhage: Secondary | ICD-10-CM | POA: Diagnosis present

## 2015-03-13 DIAGNOSIS — Z923 Personal history of irradiation: Secondary | ICD-10-CM | POA: Diagnosis not present

## 2015-03-13 DIAGNOSIS — K2901 Acute gastritis with bleeding: Secondary | ICD-10-CM | POA: Diagnosis not present

## 2015-03-13 DIAGNOSIS — T66XXXA Radiation sickness, unspecified, initial encounter: Secondary | ICD-10-CM | POA: Diagnosis present

## 2015-03-13 LAB — TROPONIN I: Troponin I: <0.03 ng/mL (ref <0.031)

## 2015-03-13 LAB — CBC
HCT: 14.1 % — ABNORMAL LOW (ref 39.0–52.0)
Hemoglobin: 4 g/dL — CL (ref 13.0–17.0)
MCH: 27.2 pg (ref 26.0–34.0)
MCHC: 28.4 g/dL — ABNORMAL LOW (ref 30.0–36.0)
MCV: 95.9 fL (ref 78.0–100.0)
PLATELETS: 120 10*3/uL — AB (ref 150–400)
RBC: 1.47 MIL/uL — AB (ref 4.22–5.81)
RDW: 22.1 % — ABNORMAL HIGH (ref 11.5–15.5)
WBC: 4.2 10*3/uL (ref 4.0–10.5)

## 2015-03-13 LAB — PREPARE RBC (CROSSMATCH)

## 2015-03-13 LAB — POC OCCULT BLOOD, ED: FECAL OCCULT BLD: POSITIVE — AB

## 2015-03-13 LAB — COMPREHENSIVE METABOLIC PANEL
ALT: 10 U/L — ABNORMAL LOW (ref 17–63)
AST: 24 U/L (ref 15–41)
Albumin: 2.9 g/dL — ABNORMAL LOW (ref 3.5–5.0)
Alkaline Phosphatase: 830 U/L — ABNORMAL HIGH (ref 38–126)
Anion gap: 6 (ref 5–15)
BUN: 16 mg/dL (ref 6–20)
CHLORIDE: 105 mmol/L (ref 101–111)
CO2: 23 mmol/L (ref 22–32)
Calcium: 8.2 mg/dL — ABNORMAL LOW (ref 8.9–10.3)
Creatinine, Ser: 0.52 mg/dL — ABNORMAL LOW (ref 0.61–1.24)
GFR calc Af Amer: >60 mL/min (ref >60)
GFR calc non Af Amer: >60 mL/min (ref >60)
Glucose, Bld: 141 mg/dL — ABNORMAL HIGH (ref 65–99)
Potassium: 3.9 mmol/L (ref 3.5–5.1)
Sodium: 134 mmol/L — ABNORMAL LOW (ref 135–145)
Total Bilirubin: 0.8 mg/dL (ref 0.3–1.2)
Total Protein: 5.6 g/dL — ABNORMAL LOW (ref 6.5–8.1)

## 2015-03-13 LAB — PROTIME-INR
INR: 1.31 (ref 0.00–1.49)
PROTHROMBIN TIME: 16.4 s — AB (ref 11.6–15.2)

## 2015-03-13 LAB — BRAIN NATRIURETIC PEPTIDE: B Natriuretic Peptide: 92.4 pg/mL (ref 0.0–100.0)

## 2015-03-13 MED ORDER — SODIUM CHLORIDE 0.9 % IV SOLN: INTRAVENOUS | Status: DC

## 2015-03-13 MED ORDER — SODIUM CHLORIDE 0.9 % IV SOLN
80.0000 mg | Freq: Once | INTRAVENOUS | Status: AC
  Administered 2015-03-13: 80 mg via INTRAVENOUS
  Filled 2015-03-13: qty 80

## 2015-03-13 MED ORDER — SODIUM CHLORIDE 0.9 % IV SOLN: 10.0000 mL/h | Freq: Once | INTRAVENOUS | Status: AC

## 2015-03-13 MED ORDER — SODIUM CHLORIDE 0.9 % IV BOLUS (SEPSIS)
500.0000 mL | Freq: Once | INTRAVENOUS | Status: AC
  Administered 2015-03-13: 500 mL via INTRAVENOUS

## 2015-03-13 MED ORDER — BICALUTAMIDE 50 MG PO TABS
50.0000 mg | ORAL_TABLET | Freq: Every day | ORAL | Status: DC
  Administered 2015-03-14 – 2015-03-16 (×3): 50 mg via ORAL
  Filled 2015-03-13 (×4): qty 1

## 2015-03-13 MED ORDER — SODIUM CHLORIDE 0.9 % IV SOLN
8.0000 mg/h | INTRAVENOUS | Status: DC
  Administered 2015-03-13 – 2015-03-14 (×3): 8 mg/h via INTRAVENOUS
  Filled 2015-03-13 (×7): qty 80

## 2015-03-13 MED ORDER — DEXTROSE IN LACTATED RINGERS 5 % IV SOLN
INTRAVENOUS | Status: DC
  Administered 2015-03-13: via INTRAVENOUS

## 2015-03-13 MED ORDER — ALBUTEROL SULFATE (2.5 MG/3ML) 0.083% IN NEBU
3.0000 mL | INHALATION_SOLUTION | Freq: Four times a day (QID) | RESPIRATORY_TRACT | Status: DC | PRN
  Administered 2015-03-14 (×2): 3 mL via RESPIRATORY_TRACT
  Filled 2015-03-13 (×3): qty 3

## 2015-03-13 MED ORDER — TAMSULOSIN HCL 0.4 MG PO CAPS
0.4000 mg | ORAL_CAPSULE | Freq: Every day | ORAL | Status: DC
  Administered 2015-03-14 – 2015-03-16 (×3): 0.4 mg via ORAL
  Filled 2015-03-13 (×3): qty 1

## 2015-03-13 MED ORDER — SODIUM CHLORIDE 0.9 % IV SOLN
INTRAVENOUS | Status: DC
  Administered 2015-03-13: 18:00:00 via INTRAVENOUS

## 2015-03-13 NOTE — ED Provider Notes (Addendum)
CSN: [id number]     Arrival date & time 03/13/15  1651 History   First MD Initiated Contact with Patient 03/13/15 1707     Chief Complaint  Patient presents with  . Shortness of Breath     (Consider location/radiation/quality/duration/timing/severity/associated sxs/prior Treatment) Patient is a 81 y.o. male presenting with shortness of breath. The history is provided by the patient and a relative.  Shortness of Breath Associated symptoms: no abdominal pain, no chest pain, no cough, no fever, no headaches, no neck pain, no rash, no sore throat and no vomiting   Patient w hx copd, c/o increased sob and bilateral leg swelling for the past week. Gradual onset, persistent, worsening. Symptoms moderate. Denies specific exacerbating or alleviating factors. No current or recent chest pain or discomfort. Denies cough, sore throat or other uri c/o. No orthopnea or pnd. Urinating of normal amount. States compliant w normal meds, no recent change in meds or doses. Former smoker. Uses neb tx prn.  Denies recent immobility, surgery, trauma or travel. No hx dvt or pe. No pleuritic pain.       Past Medical History  Diagnosis Date  . Asthma   . High cholesterol   . Type II diabetes mellitus ([place])   . Anemia   . Arthritis     "left knee" (11/23/2013)  . COPD (chronic obstructive pulmonary disease) ([place])     [name] 09/17/2010  . Prostate cancer ([place]) 06/03/2001    bilat involvement,adenocarcinoma,gleason 6(3+3) PSA=37.6  . Stomach cancer ([place]) 11/24/13    adenocarcinoma   . History of blood transfusion 11/23/2013    related to lower GI bleeding  . History of radiation therapy 03/28/2003-05/25/2003    Prostate  . Glaucoma   . Hx of radiation therapy 01/15/14-109/15    gastric cancer   Past Surgical History  Procedure Laterality Date  . Tumor excision  10 years ago    "off my spinal cord"  . Hemorrhoid surgery  09/2000    [name] 09/30/2010  . Cardiac catheterization      [name] 09/30/2010  .  Esophagogastroduodenoscopy Left 11/24/2013    Procedure: ESOPHAGOGASTRODUODENOSCOPY (EGD);  Surgeon: Juanita Craver, MD;  Location: [facility] ENDOSCOPY;  Service: Endoscopy;  Laterality: Left;   Family History  Problem Relation Age of Onset  . Diabetes Mellitus II Mother   . Other Father    Social History  Substance Use Topics  . Smoking status: Former Smoker -- 1.00 packs/day for 5 years    Types: Cigarettes  . Smokeless tobacco: Never Used     Comment: "quit smoking in the 1950's"  . Alcohol Use: No     Comment: 11/23/2013 "don't drink anything now; never was a drinker" quit drugs years ago    Review of Systems  Constitutional: Negative for fever and chills.  HENT: Negative for sore throat.   Eyes: Negative for redness.  Respiratory: Positive for shortness of breath. Negative for cough.   Cardiovascular: Positive for leg swelling. Negative for chest pain and palpitations.  Gastrointestinal: Negative for vomiting, abdominal pain, diarrhea and blood in stool.  Genitourinary: Negative for dysuria and flank pain.  Musculoskeletal: Negative for back pain and neck pain.  Skin: Negative for rash.  Neurological: Negative for headaches.  Hematological: Does not bruise/bleed easily.  Psychiatric/Behavioral: Negative for confusion.      Allergies  Review of patient's allergies indicates no known allergies.  Home Medications   Prior to Admission medications   Medication Sig Start Date End Date Taking? Authorizing Provider  albuterol (PROVENTIL HFA;VENTOLIN HFA) 108 (90 BASE) MCG/ACT inhaler Inhale 2 puffs into the lungs every 6 (six) hours as needed for wheezing.   Yes Historical Provider, MD  albuterol (PROVENTIL) (2.5 MG/3ML) 0.083% nebulizer solution Take 2.5 mg by nebulization every 6 (six) hours as needed for wheezing.   Yes Historical Provider, MD  aspirin 325 MG tablet Take 650 mg by mouth daily as needed (pain).   Yes Historical Provider, MD  bicalutamide (CASODEX) 50 MG tablet Take 50  mg by mouth daily. 11/16/13  Yes Historical Provider, MD  esomeprazole (NEXIUM) 40 MG packet Take 40 mg by mouth daily before breakfast. 11/25/13  Yes [name], MD  ferrous sulfate 325 (65 FE) MG tablet Take 325 mg by mouth daily with breakfast.   Yes Historical Provider, MD  Fluticasone-Salmeterol (ADVAIR) 250-50 MCG/DOSE AEPB Inhale 1 puff into the lungs every 12 (twelve) hours.   Yes Historical Provider, MD  metFORMIN (GLUCOPHAGE) 500 MG tablet Take 1,000 mg by mouth 2 (two) times daily with a meal.   Yes Historical Provider, MD  rosuvastatin (CRESTOR) 10 MG tablet Take 10 mg by mouth daily.   Yes Historical Provider, MD  sitaGLIPtin (JANUVIA) 50 MG tablet Take 50 mg by mouth daily.   Yes Historical Provider, MD  tamsulosin (FLOMAX) 0.4 MG CAPS Take 0.4 mg by mouth daily after breakfast.   Yes Historical Provider, MD   BP 97/49 mmHg  Pulse 102  Temp(Src) 98.8 F (37.1 C) (Oral)  Resp 22  SpO2 100% Physical Exam  Constitutional: He is oriented to person, place, and time. He appears well-developed and well-nourished. No distress.  HENT:  Head: Atraumatic.  Mouth/Throat: Oropharynx is clear and moist.  Eyes: Conjunctivae are normal. No scleral icterus.  Pale conj  Neck: Neck supple. No JVD present. No tracheal deviation present.  Cardiovascular: Normal rate, regular rhythm, normal heart sounds and intact distal pulses.  Exam reveals no gallop and no friction rub.   No murmur heard. Pulmonary/Chest: Effort normal and breath sounds normal. No accessory muscle usage. No respiratory distress.  Abdominal: Soft. Bowel sounds are normal. He exhibits no distension. There is no tenderness.  Genitourinary:  Rectal dark brown stool, sent for hemoccult.   Musculoskeletal: Normal range of motion. He exhibits edema. He exhibits no tenderness.  Moderate symmetric bilateral lower leg and foot edema. No calf pain or tenderness.   Neurological: He is alert and oriented to person, place, and time.   Skin: Skin is warm and dry. He is not diaphoretic. There is pallor.  Psychiatric: He has a normal mood and affect.  Nursing note and vitals reviewed.   ED Course  Procedures (including critical care time) Labs Review  Results for orders placed or performed during the hospital encounter of 03/13/15  CBC  Result Value Ref Range   WBC 4.2 4.0 - 10.5 K/uL   RBC 1.47 (L) 4.22 - 5.81 MIL/uL   Hemoglobin 4.0 (LL) 13.0 - 17.0 g/dL   HCT 14.1 (L) 39.0 - 52.0 %   MCV 95.9 78.0 - 100.0 fL   MCH 27.2 26.0 - 34.0 pg   MCHC 28.4 (L) 30.0 - 36.0 g/dL   RDW 22.1 (H) 11.5 - 15.5 %   Platelets 120 (L) 150 - 400 K/uL  POC occult blood, ED Provider will collect  Result Value Ref Range   Fecal Occult Bld POSITIVE (A) NEGATIVE  Type and screen  Result Value Ref Range   ABO/RH(D) O POS    Antibody  Screen PENDING    Sample Expiration 03/16/2015    No results found.     I have personally reviewed and evaluated these images and lab results as part of my medical decision-making.   EKG Interpretation   Date/Time:  Wednesday March 13 2015 17:05:01 EDT Ventricular Rate:  98 PR Interval:  137 QRS Duration: 68 QT Interval:  358 QTC Calculation: 457 R Axis:   40 Text Interpretation:  Sinus rhythm Borderline T wave abnormalities No  significant change since last tracing Confirmed by Ashok Cordia  MD, Lennette Bihari  ([id number]) on 03/13/2015 5:33:57 PM      MDM   Iv ns. Labs. Cxr.  Reviewed nursing notes and prior charts for additional history.   Pt notes hx gi bleeding and states in past 1-2 months stools intermittent dark/black.  hgb 4.  Transfuse prbc. protonix iv.   Hospitalist paged for admission.  On reivew prior charts, hx gastric ca felt not candidate for OR, had palliative radiation rx, hx antral ulcer.  Will consult gi as well.  Discussed pt with Dr Collene Mares - they will see pt in AM, state for now transfusion, supportive tx.   CRITICAL CARE  RE severe, symptomatic anemia resulting in  failure, requiring emergent transfusion, gi bleeding. Performed by: Mirna Mires Total critical care time: 35 Critical care time was exclusive of separately billable procedures and treating other patients. Critical care was necessary to treat or prevent imminent or life-threatening deterioration. Critical care was time spent personally by me on the following activities: development of treatment plan with patient and/or surrogate as well as nursing, discussions with consultants, evaluation of patient's response to treatment, examination of patient, obtaining history from patient or surrogate, ordering and performing treatments and interventions, ordering and review of laboratory studies, ordering and review of radiographic studies, pulse oximetry and re-evaluation of patient's condition.       Lajean Saver, MD 03/13/15 [id number]

## 2015-03-13 NOTE — ED Notes (Signed)
Pt reports black stools prior to this visit, within the last 2 weeks, however pt states "once I got my new metformin, my stool was not black anymore."

## 2015-03-13 NOTE — ED Notes (Signed)
Pt to ED via [facility] with complaint of shortness of breath x1 day with exertion, pt is tachypneic, pt has bilateral +3 leg swelling. Pt called PCP regarding symptoms and was advised to come to ER. Slightly hypotensive 95/53. HR @ 103, regular. A/o x4. CBG 196, pt has hx of diabetes.

## 2015-03-13 NOTE — H&P (Signed)
[facility] History and Physical  Melvin Gardner PYP:[id number] DOB: Aug 31, 1929 DOA: 03/13/2015  Referring physician: ED PCP: [name]., MD  Specialists: GI  Chief Complaint: sob  HPI: 81 y/o ? Advanced prostate Ca-[place] sensitive-Recieving lupron/Cosodex as per Dr. Janice Norrie Gastric CA diag 11/2013 after GI showing [place] Ca-XRt perfomred-noty a candidate for surgical resection Prior adenomatous polyp 2002/2006/2012 Dr. Ky Barban Coronary arteries on cath 4.27.06 Dm ty 2 COPD Anemia Recent admit 7/18-7/19 Pneumonia  Chief complaint bring him to the emergency room or shortness of breath X1 week plus lower extremity swelling Usually can walk around and is fully active however has been winded with just 10-12 steps which is unusual for him In conjunction noticed lower extremity swelling over the past week [place] passing that he's had dark stool for over a month Stool is formed but is black Has not been taking any nonsteroidals and does not drink heavily although takes occasional aspirin--note that the dosage form he takes is 650 milligrams No nausea no vomiting no hematemesis  No weakness on any one side of body He did not think it was normal but did not pay attention to it  States that he's been weak overall and has had a couple of fainting episodes but this seemed to have resolved  No fever no chills No dark urine No itching No icterus No chest pain No blurred vision No recent ill contacts   No known drug allergies  Smoked only for short period of time as young man, now uses marijuana occasionally  Lives at home with his friend  Prior surgery includes a tumor in the back which was removed which was noncancerous   Workup in emergency room revealed hemoglobin of 4, BUN/creatinine 16/0.5, alkaline phosphatase 8:30 LFTs otherwise within normal limits glucose 141 INR 1.3  Chest x-ray within normal limits EKG borderline  Review of Systems: See  above  Past Medical History  Diagnosis Date  . Asthma   . High cholesterol   . Type II diabetes mellitus ([place])   . Anemia   . Arthritis     "left knee" (11/23/2013)  . COPD (chronic obstructive pulmonary disease) ([place])     [name] 09/17/2010  . Prostate cancer ([place]) 06/03/2001    bilat involvement,adenocarcinoma,gleason 6(3+3) PSA=37.6  . Stomach cancer ([place]) 11/24/13    adenocarcinoma   . History of blood transfusion 11/23/2013    related to lower GI bleeding  . History of radiation therapy 03/28/2003-05/25/2003    Prostate  . Glaucoma   . Hx of radiation therapy 01/15/14-109/15    gastric cancer   Past Surgical History  Procedure Laterality Date  . Tumor excision  10 years ago    "off my spinal cord"  . Hemorrhoid surgery  09/2000    [name] 09/30/2010  . Cardiac catheterization      [name] 09/30/2010  . Esophagogastroduodenoscopy Left 11/24/2013    Procedure: ESOPHAGOGASTRODUODENOSCOPY (EGD);  Surgeon: Juanita Craver, MD;  Location: [facility] ENDOSCOPY;  Service: Endoscopy;  Laterality: Left;   Social History:  Social History   Social History Narrative    No Known Allergies  Family History  Problem Relation Age of Onset  . Diabetes Mellitus II Mother   . Other Father     Prior to Admission medications   Medication Sig Start Date End Date Taking? Authorizing Provider  albuterol (PROVENTIL HFA;VENTOLIN HFA) 108 (90 BASE) MCG/ACT inhaler Inhale 2 puffs into the lungs every 6 (six) hours as needed for wheezing.   Yes Historical Provider,  MD  albuterol (PROVENTIL) (2.5 MG/3ML) 0.083% nebulizer solution Take 2.5 mg by nebulization every 6 (six) hours as needed for wheezing.   Yes Historical Provider, MD  aspirin 325 MG tablet Take 650 mg by mouth daily as needed (pain).   Yes Historical Provider, MD  bicalutamide (CASODEX) 50 MG tablet Take 50 mg by mouth daily. 11/16/13  Yes Historical Provider, MD  esomeprazole (NEXIUM) 40 MG packet Take 40 mg by mouth daily before breakfast. 11/25/13  Yes  [name], MD  ferrous sulfate 325 (65 FE) MG tablet Take 325 mg by mouth daily with breakfast.   Yes Historical Provider, MD  Fluticasone-Salmeterol (ADVAIR) 250-50 MCG/DOSE AEPB Inhale 1 puff into the lungs every 12 (twelve) hours.   Yes Historical Provider, MD  metFORMIN (GLUCOPHAGE) 500 MG tablet Take 1,000 mg by mouth 2 (two) times daily with a meal.   Yes Historical Provider, MD  rosuvastatin (CRESTOR) 10 MG tablet Take 10 mg by mouth daily.   Yes Historical Provider, MD  sitaGLIPtin (JANUVIA) 50 MG tablet Take 50 mg by mouth daily.   Yes Historical Provider, MD  tamsulosin (FLOMAX) 0.4 MG CAPS Take 0.4 mg by mouth daily after breakfast.   Yes Historical Provider, MD   Physical Exam: Filed Vitals:   03/13/15 1721  BP: 97/49  Pulse: 102  Temp: 98.8 F (37.1 C)  TempSrc: Oral  Resp: 22  SpO2: 100%    EOMI NCAT increased pallor JVD noted elevated at 30 S1-S2 no murmur rub or gallop sinus rhythm Chest clinically clear no added sound no rales or rhonchi Abdomen soft nontender nondistended no rebound no guarding rectal exam deferred Grade 2 lower extremity edema pitting Power 5/5 but overall debilitated   Labs on Admission:  Basic Metabolic Panel: No results for input(s): NA, K, CL, CO2, GLUCOSE, BUN, CREATININE, CALCIUM, MG, PHOS in the last 168 hours. Liver Function Tests: No results for input(s): AST, ALT, ALKPHOS, BILITOT, PROT, ALBUMIN in the last 168 hours. No results for input(s): LIPASE, AMYLASE in the last 168 hours. No results for input(s): AMMONIA in the last 168 hours. CBC:  Recent Labs Lab 03/13/15 1727  WBC 4.2  HGB 4.0*  HCT 14.1*  MCV 95.9  PLT 120*   Cardiac Enzymes: No results for input(s): CKTOTAL, CKMB, CKMBINDEX, TROPONINI in the last 168 hours.  BNP (last 3 results) No results for input(s): BNP in the last 8760 hours.  ProBNP (last 3 results) No results for input(s): PROBNP in the last 8760 hours.  CBG: No results for input(s):  GLUCAP in the last 168 hours.  Radiological Exams on Admission: Dg Chest Port 1 View  03/13/2015  CLINICAL DATA:  81 year old male with acute shortness of breath. History of prostate cancer. EXAM: PORTABLE CHEST 1 VIEW COMPARISON:  01/14/2015 and prior chest radiographs dating back to 08/07/2008 FINDINGS: The cardiomediastinal silhouette is unremarkable. Mild left basilar scarring again noted. There is no evidence of focal airspace disease, pulmonary edema, suspicious pulmonary nodule/mass, pleural effusion, or pneumothorax. Sclerosis within the visualized bones again noted. No acute bony abnormalities are identified. IMPRESSION: No evidence of acute abnormality. Electronically Signed   By: [name] M.D.   On: 03/13/2015 18:19    EKG: Independently reviewed. Review  Assessment/Plan  Upper GI bleed Likely secondary to gastric CA status post erosion Patient's gastroenterologist has been consulted He'll be typed and screened and transfused at least 2 units of packed red blood cells-aim for hemoglobin above 7 He tells me he wishes  to be full code and did not really seem to understand what his oncologist had discussed with him he says--we will request his oncologist to discuss options although I think that they are few and far between We will continue Protonix as a drip and has been given 80 mg bolus in the emergency room  Shortness of breath possible high-output heart failure given elevated JVD as well as gradual onset with lower extremity swelling Poor prognosis would not workup with echocardiogram is no change the prognosis Transfusion should help the most  Prostate cancer on hormone deprivation therapy by Melvin Gardner will continue Casodex while in hospital as well as tamsulosin  Type 2 diabetes mellitus-placed on every 4 sliding scale coverage Placed on D5 LR to give some tenderness stable also to give some glucose as he will be nothing by mouth  COPD/asthma-continue  nebulizers  Patient requesting full CODE STATUS which is not compatible with his multiple conditions however he has clear mentation and seems to understand that he does have not one to 2 cancers. DrShadad as well as Dr. Collene Mares to weigh in on options available to him Updated friend at the bedside Inpatient telemetry bed requested as hemodynamically stable   Verlon [facility] Pager [phone]  If 7PM-7AM, please contact night-coverage [email or web address] Password [facility] 03/13/2015, 6:27 PM

## 2015-03-14 DIAGNOSIS — R600 Localized edema: Secondary | ICD-10-CM

## 2015-03-14 DIAGNOSIS — K254 Chronic or unspecified gastric ulcer with hemorrhage: Secondary | ICD-10-CM

## 2015-03-14 DIAGNOSIS — K2901 Acute gastritis with bleeding: Secondary | ICD-10-CM

## 2015-03-14 LAB — CBC
HCT: 16.9 % — ABNORMAL LOW (ref 39.0–52.0)
Hemoglobin: 5.2 g/dL — CL (ref 13.0–17.0)
MCH: 28.1 pg (ref 26.0–34.0)
MCHC: 30.8 g/dL (ref 30.0–36.0)
MCV: 91.4 fL (ref 78.0–100.0)
PLATELETS: 83 10*3/uL — AB (ref 150–400)
RBC: 1.85 MIL/uL — ABNORMAL LOW (ref 4.22–5.81)
RDW: 20 % — AB (ref 11.5–15.5)
WBC: 3.3 10*3/uL — ABNORMAL LOW (ref 4.0–10.5)

## 2015-03-14 LAB — COMPREHENSIVE METABOLIC PANEL
ALT: 8 U/L — ABNORMAL LOW (ref 17–63)
ANION GAP: 6 (ref 5–15)
AST: 15 U/L (ref 15–41)
Albumin: 2.5 g/dL — ABNORMAL LOW (ref 3.5–5.0)
Alkaline Phosphatase: 675 U/L — ABNORMAL HIGH (ref 38–126)
BILIRUBIN TOTAL: 0.9 mg/dL (ref 0.3–1.2)
BUN: 12 mg/dL (ref 6–20)
CALCIUM: 7.8 mg/dL — AB (ref 8.9–10.3)
CO2: 22 mmol/L (ref 22–32)
Chloride: 108 mmol/L (ref 101–111)
Creatinine, Ser: 0.54 mg/dL — ABNORMAL LOW (ref 0.61–1.24)
GFR calc Af Amer: >60 mL/min (ref >60)
Glucose, Bld: 133 mg/dL — ABNORMAL HIGH (ref 65–99)
POTASSIUM: 3.5 mmol/L (ref 3.5–5.1)
Sodium: 136 mmol/L (ref 135–145)
TOTAL PROTEIN: 4.9 g/dL — AB (ref 6.5–8.1)

## 2015-03-14 LAB — PROTIME-INR
INR: 1.4 (ref 0.00–1.49)
PROTHROMBIN TIME: 17.2 s — AB (ref 11.6–15.2)

## 2015-03-14 LAB — PREPARE RBC (CROSSMATCH)

## 2015-03-14 LAB — GLUCOSE, CAPILLARY: GLUCOSE-CAPILLARY: 139 mg/dL — AB (ref 65–99)

## 2015-03-14 MED ORDER — SODIUM CHLORIDE 0.9 % IV SOLN: Freq: Once | INTRAVENOUS | Status: DC

## 2015-03-14 MED ORDER — SODIUM CHLORIDE 0.9 % IV SOLN
Freq: Once | INTRAVENOUS | Status: AC
  Administered 2015-03-14: 12:00:00 via INTRAVENOUS

## 2015-03-14 MED ORDER — FUROSEMIDE 10 MG/ML IJ SOLN
40.0000 mg | Freq: Once | INTRAMUSCULAR | Status: AC
  Administered 2015-03-14: 40 mg via INTRAVENOUS
  Filled 2015-03-14: qty 4

## 2015-03-14 MED ORDER — ALBUTEROL SULFATE (2.5 MG/3ML) 0.083% IN NEBU
3.0000 mL | INHALATION_SOLUTION | RESPIRATORY_TRACT | Status: DC | PRN
  Administered 2015-03-14 – 2015-03-16 (×6): 3 mL via RESPIRATORY_TRACT
  Filled 2015-03-14 (×5): qty 3

## 2015-03-14 MED ORDER — ALBUTEROL SULFATE (2.5 MG/3ML) 0.083% IN NEBU
2.5000 mg | INHALATION_SOLUTION | RESPIRATORY_TRACT | Status: AC
  Administered 2015-03-14: 2.5 mg via RESPIRATORY_TRACT
  Filled 2015-03-14: qty 3

## 2015-03-14 NOTE — Progress Notes (Signed)
[facility] PROGRESS NOTE  Melvin Gardner TWK:[id number] DOB: April 12, 1930 DOA: 03/13/2015 PCP: [name]., MD  Length of stay: 1   Assessment/Plan: Active Problems:   Severe anemia   GI bleed  Brief summary 81 year old male with a history of gastric cancer, presents with shortness of breath, initial hemoglobin 4.0, followed by Dr.[name] and  Dr [name].    Hospital course Normocytic anemia likely secondary to upper GI bleeding, history of gastric cancer, And patient is status post 2 units of packed red blood cells, hemoglobin still less than 7 We'll transfuse another 2 units of packed red blood cells Dr. [name] to see the patient today, keep nothing by mouth, may need an EGD Continue Protonix drip, FOBT positive Check an anemia panel  History of prostrate cancer on hormone deprivation therapy by Dr. Richardson Chiquito will continue Casodex while in hospital as well as tamsulosin  Type 2 diabetes mellitus-placed on every 4 sliding scale coverage, hold metformin/Januvia  COPD/asthma-continue nebulizers  Dyslipidemia continue Crestor  DVT prophylaxsis SCDs  Code Status:      Code Status Orders        Start     Ordered   03/13/15 2056  Full code   Continuous     03/13/15 2055     Family Communication: family updated about patient's clinical progress Disposition Plan:  As above      Consultants:  Gastroenterology  Procedures:  None  Antibiotics: Anti-infectives    None         HPI/Subjective: No active bleeding overnight  Objective: Filed Vitals:   03/14/15 0632 03/14/15 0656 03/14/15 0942 03/14/15 1023  BP: 104/52 100/56 107/67   Pulse: 73 71 69 70  Temp: 98 F (36.7 C) 98.2 F (36.8 C) 98.4 F (36.9 C)   TempSrc: Oral Oral Oral   Resp: 20 18 17 18   SpO2: 100% 99% 100% 100%    Intake/Output Summary (Last 24 hours) at 03/14/15 1132 Last data filed at 03/14/15 1012  Gross per 24 hour  Intake 1749.58 ml  Output    415 ml   Net 1334.58 ml    Exam:  General: No acute respiratory distress Lungs: Clear to auscultation bilaterally without wheezes or crackles Cardiovascular: Regular rate and rhythm without murmur gallop or rub normal S1 and S2 Abdomen: Nontender, nondistended, soft, bowel sounds positive, no rebound, no ascites, no appreciable mass Extremities: No significant cyanosis, clubbing, or edema bilateral lower extremities     Data Review   Micro Results No results found for this or any previous visit (from the past 240 hour(s)).  Radiology Reports Dg Chest Port 1 View  03/13/2015  CLINICAL DATA:  81 year old male with acute shortness of breath. History of prostate cancer. EXAM: PORTABLE CHEST 1 VIEW COMPARISON:  01/14/2015 and prior chest radiographs dating back to 08/07/2008 FINDINGS: The cardiomediastinal silhouette is unremarkable. Mild left basilar scarring again noted. There is no evidence of focal airspace disease, pulmonary edema, suspicious pulmonary nodule/mass, pleural effusion, or pneumothorax. Sclerosis within the visualized bones again noted. No acute bony abnormalities are identified. IMPRESSION: No evidence of acute abnormality. Electronically Signed   By: [name] M.D.   On: 03/13/2015 18:19     CBC  Recent Labs Lab 03/13/15 1727 03/14/15 0453  WBC 4.2 3.3*  HGB 4.0* 5.2*  HCT 14.1* 16.9*  PLT 120* 83*  MCV 95.9 91.4  MCH 27.2 28.1  MCHC 28.4* 30.8  RDW 22.1* 20.0*    Chemistries  Recent Labs Lab 03/13/15 1727 03/14/15 0453  NA 134* 136  K 3.9 3.5  CL 105 108  CO2 23 22  GLUCOSE 141* 133*  BUN 16 12  CREATININE 0.52* 0.54*  CALCIUM 8.2* 7.8*  AST 24 15  ALT 10* 8*  ALKPHOS 830* 675*  BILITOT 0.8 0.9   ------------------------------------------------------------------------------------------------------------------ CrCl cannot be calculated (Unknown ideal  weight.). ------------------------------------------------------------------------------------------------------------------ No results for input(s): HGBA1C in the last 72 hours. ------------------------------------------------------------------------------------------------------------------ No results for input(s): CHOL, HDL, LDLCALC, TRIG, CHOLHDL, LDLDIRECT in the last 72 hours. ------------------------------------------------------------------------------------------------------------------ No results for input(s): TSH, T4TOTAL, T3FREE, THYROIDAB in the last 72 hours.  Invalid input(s): FREET3 ------------------------------------------------------------------------------------------------------------------ No results for input(s): VITAMINB12, FOLATE, FERRITIN, TIBC, IRON, RETICCTPCT in the last 72 hours.  Coagulation profile  Recent Labs Lab 03/13/15 1727 03/14/15 0453  INR 1.31 1.40    No results for input(s): DDIMER in the last 72 hours.  Cardiac Enzymes  Recent Labs Lab 03/13/15 1727  TROPONINI <0.03   ------------------------------------------------------------------------------------------------------------------ Invalid input(s): POCBNP   CBG:  Recent Labs Lab 03/14/15 0821  GLUCAP 139*       Studies: Dg Chest Port 1 View  03/13/2015  CLINICAL DATA:  81 year old male with acute shortness of breath. History of prostate cancer. EXAM: PORTABLE CHEST 1 VIEW COMPARISON:  01/14/2015 and prior chest radiographs dating back to 08/07/2008 FINDINGS: The cardiomediastinal silhouette is unremarkable. Mild left basilar scarring again noted. There is no evidence of focal airspace disease, pulmonary edema, suspicious pulmonary nodule/mass, pleural effusion, or pneumothorax. Sclerosis within the visualized bones again noted. No acute bony abnormalities are identified. IMPRESSION: No evidence of acute abnormality. Electronically Signed   By: [name] M.D.   On: 03/13/2015  18:19      Lab Results  Component Value Date   HGBA1C 5.7* 12/03/2014   HGBA1C 6.9* 11/24/2013   HGBA1C * 01/23/2007    6.6 (NOTE)   The ADA recommends the following therapeutic goals for glycemic   control related to Hgb A1C measurement:   Goal of Therapy:   < 7.0% Hgb A1C   Action Suggested:  > 8.0% Hgb A1C   Ref:  Diabetes Care, 22, Suppl. 1, 1999   Lab Results  Component Value Date   LDLCALC * 01/24/2007    114        Total Cholesterol/HDL:CHD Risk Coronary Heart Disease Risk Table                     Men   Women  1/2 Average Risk   3.4   3.3   CREATININE 0.54* 03/14/2015       Scheduled Meds: . sodium chloride   Intravenous Once  . sodium chloride   Intravenous Once  . bicalutamide  50 mg Oral Daily  . tamsulosin  0.4 mg Oral QPC breakfast   Continuous Infusions: . sodium chloride 10 mL/hr at 03/13/15 1741  . dextrose 5% lactated ringers 75 mL/hr at 03/13/15 2334  . pantoprozole (PROTONIX) infusion 8 mg/hr (03/14/15 0524)    Active Problems:   Severe anemia   GI bleed    Time spent: 45 minutes   [facility] Pager [phone]. If 7PM-7AM, please contact night-coverage at [email or web address], password [facility] 03/14/2015, 11:32 AM  LOS: 1 day

## 2015-03-14 NOTE — Care Management Note (Signed)
Case Management Note  Patient Details  Name: DEONDRAY OSPINA MRN: [id number] Date of Birth: 03/25/1930  Subjective/Objective:     Date:03/14/15 Spoke with patient at the bedside. Introduced self as Tourist information centre manager and explained role in discharge planning and how to be reached. Verified patient lives in town, alone. Has DME uses cane when he goes out and has a nebulizer machine at home. Expressed potential need for no other DME. Verified patient anticipates to go home at time of discharge and will have full-time part-time supervision by family friends neighbors at this time to best of their knowledge. Patient  denied needing help with their medication.  Patient takes a cab or scat  to MD appointments. Verified patient has PCP Willey Blade.  Patient will need to get pt eval.     Plan: CM will continue to follow for discharge planning and [facility] resources.                Action/Plan:   Expected Discharge Date:                  Expected Discharge Plan:  [place]  In-House Referral:     Discharge planning Services  CM Consult  Post Acute Care Choice:    Choice offered to:     DME Arranged:    DME Agency:     HH Arranged:    [place] Agency:     Status of Service:  In process, will continue to follow  Medicare Important Message Given:    Date Medicare IM Given:    Medicare IM give by:    Date Additional Medicare IM Given:    Additional Medicare Important Message give by:     If discussed at [place] of Stay Meetings, dates discussed:    Additional Comments:  Zenon Mayo, RN 03/14/2015, 2:18 PM

## 2015-03-14 NOTE — Progress Notes (Signed)
Utilization review completed. Katiejo Gilroy, RN, BSN. 

## 2015-03-14 NOTE — Consult Note (Signed)
Reason for Consult: Anemia and history of antral adenocarcinoma Referring Physician: [facility]  Melvin Gardner HPI: This is an 81 year old male with hormone responsive prostate cancer and adenocarcinoma of the gastric antrum (11/24/2013) admitted for symptomatic anemia.  His stool is formed and black for the past month.  The patient only takes ASA.  His HGB upon admission was 4 g/dL with an INR of 1.3.  The patient did receive radiation treatment for his gastric cancer and the last PET scan did not show any activity in his gastric region (08/2014).    Past Medical History  Diagnosis Date  . Asthma   . High cholesterol   . Type II diabetes mellitus ([place])   . Anemia   . Arthritis     "left knee" (11/23/2013)  . COPD (chronic obstructive pulmonary disease) ([place])     [name] 09/17/2010  . Prostate cancer ([place]) 06/03/2001    bilat involvement,adenocarcinoma,gleason 6(3+3) PSA=37.6  . Stomach cancer ([place]) 11/24/13    adenocarcinoma   . History of blood transfusion 11/23/2013    related to lower GI bleeding  . History of radiation therapy 03/28/2003-05/25/2003    Prostate  . Glaucoma   . Hx of radiation therapy 01/15/14-109/15    gastric cancer    Past Surgical History  Procedure Laterality Date  . Tumor excision  10 years ago    "off my spinal cord"  . Hemorrhoid surgery  09/2000    [name] 09/30/2010  . Cardiac catheterization      [name] 09/30/2010  . Esophagogastroduodenoscopy Left 11/24/2013    Procedure: ESOPHAGOGASTRODUODENOSCOPY (EGD);  Surgeon: Juanita Craver, MD;  Location: [facility] ENDOSCOPY;  Service: Endoscopy;  Laterality: Left;    Family History  Problem Relation Age of Onset  . Diabetes Mellitus II Mother   . Other Father     Social History:  reports that he has quit smoking. His smoking use included Cigarettes. He has a 5 pack-year smoking history. He has never used smokeless tobacco. He reports that he does not drink alcohol or use illicit drugs.  Allergies: No Known  Allergies  Medications:  Scheduled: . sodium chloride   Intravenous Once  . bicalutamide  50 mg Oral Daily  . tamsulosin  0.4 mg Oral QPC breakfast   Continuous: . sodium chloride 10 mL/hr at 03/13/15 1741  . dextrose 5% lactated ringers 75 mL/hr at 03/13/15 2334  . pantoprozole (PROTONIX) infusion 8 mg/hr (03/14/15 0524)    Results for orders placed or performed during the hospital encounter of 03/13/15 (from the past 24 hour(s))  CBC     Status: Abnormal   Collection Time: 03/13/15  5:27 PM  Result Value Ref Range   WBC 4.2 4.0 - 10.5 K/uL   RBC 1.47 (L) 4.22 - 5.81 MIL/uL   Hemoglobin 4.0 (LL) 13.0 - 17.0 g/dL   HCT 14.1 (L) 39.0 - 52.0 %   MCV 95.9 78.0 - 100.0 fL   MCH 27.2 26.0 - 34.0 pg   MCHC 28.4 (L) 30.0 - 36.0 g/dL   RDW 22.1 (H) 11.5 - 15.5 %   Platelets 120 (L) 150 - 400 K/uL  Comprehensive metabolic panel     Status: Abnormal   Collection Time: 03/13/15  5:27 PM  Result Value Ref Range   Sodium 134 (L) 135 - 145 mmol/L   Potassium 3.9 3.5 - 5.1 mmol/L   Chloride 105 101 - 111 mmol/L   CO2 23 22 - 32 mmol/L   Glucose, Bld 141 (  H) 65 - 99 mg/dL   BUN 16 6 - 20 mg/dL   Creatinine, Ser 0.52 (L) 0.61 - 1.24 mg/dL   Calcium 8.2 (L) 8.9 - 10.3 mg/dL   Total Protein 5.6 (L) 6.5 - 8.1 g/dL   Albumin 2.9 (L) 3.5 - 5.0 g/dL   AST 24 15 - 41 U/L   ALT 10 (L) 17 - 63 U/L   Alkaline Phosphatase 830 (H) 38 - 126 U/L   Total Bilirubin 0.8 0.3 - 1.2 mg/dL   GFR calc non Af Amer >60 >60 mL/min   GFR calc Af Amer >60 >60 mL/min   Anion gap 6 5 - 15  Protime-INR     Status: Abnormal   Collection Time: 03/13/15  5:27 PM  Result Value Ref Range   Prothrombin Time 16.4 (H) 11.6 - 15.2 seconds   INR 1.31 0.00 - 1.49  Type and screen     Status: None (Preliminary result)   Collection Time: 03/13/15  5:27 PM  Result Value Ref Range   ABO/RH(D) O POS    Antibody Screen NEG    Sample Expiration 03/16/2015    Unit Number [id number]    Blood Component Type RED CELLS,LR     Unit division 00    Status of Unit ISSUED,FINAL    Transfusion Status OK TO TRANSFUSE    Crossmatch Result Compatible    Unit Number [id number]    Blood Component Type RED CELLS,LR    Unit division 00    Status of Unit ISSUED,FINAL    Transfusion Status OK TO TRANSFUSE    Crossmatch Result Compatible    Unit Number [id number]    Blood Component Type RED CELLS,LR    Unit division 00    Status of Unit ISSUED    Transfusion Status OK TO TRANSFUSE    Crossmatch Result Compatible    Unit Number [id number]    Blood Component Type RED CELLS,LR    Unit division 00    Status of Unit ISSUED    Transfusion Status OK TO TRANSFUSE    Crossmatch Result Compatible    Unit Number [id number]    Blood Component Type RED CELLS,LR    Unit division 00    Status of Unit ALLOCATED    Transfusion Status OK TO TRANSFUSE    Crossmatch Result Compatible   Brain natriuretic peptide     Status: None   Collection Time: 03/13/15  5:27 PM  Result Value Ref Range   B Natriuretic Peptide 92.4 0.0 - 100.0 pg/mL  Troponin I     Status: None   Collection Time: 03/13/15  5:27 PM  Result Value Ref Range   Troponin I <0.03 <0.031 ng/mL  POC occult blood, ED Provider will collect     Status: Abnormal   Collection Time: 03/13/15  5:38 PM  Result Value Ref Range   Fecal Occult Bld POSITIVE (A) NEGATIVE  Prepare RBC     Status: None   Collection Time: 03/13/15  6:12 PM  Result Value Ref Range   Order Confirmation ORDER PROCESSED BY BLOOD BANK   CBC     Status: Abnormal   Collection Time: 03/14/15  4:53 AM  Result Value Ref Range   WBC 3.3 (L) 4.0 - 10.5 K/uL   RBC 1.85 (L) 4.22 - 5.81 MIL/uL   Hemoglobin 5.2 (LL) 13.0 - 17.0 g/dL   HCT 16.9 (L) 39.0 - 52.0 %   MCV 91.4 78.0 - 100.0 fL   MCH 28.1 26.0 -  34.0 pg   MCHC 30.8 30.0 - 36.0 g/dL   RDW 20.0 (H) 11.5 - 15.5 %   Platelets 83 (L) 150 - 400 K/uL  Protime-INR     Status: Abnormal   Collection Time: 03/14/15  4:53 AM  Result Value Ref  Range   Prothrombin Time 17.2 (H) 11.6 - 15.2 seconds   INR 1.40 0.00 - 1.49  Comprehensive metabolic panel     Status: Abnormal   Collection Time: 03/14/15  4:53 AM  Result Value Ref Range   Sodium 136 135 - 145 mmol/L   Potassium 3.5 3.5 - 5.1 mmol/L   Chloride 108 101 - 111 mmol/L   CO2 22 22 - 32 mmol/L   Glucose, Bld 133 (H) 65 - 99 mg/dL   BUN 12 6 - 20 mg/dL   Creatinine, Ser 0.54 (L) 0.61 - 1.24 mg/dL   Calcium 7.8 (L) 8.9 - 10.3 mg/dL   Total Protein 4.9 (L) 6.5 - 8.1 g/dL   Albumin 2.5 (L) 3.5 - 5.0 g/dL   AST 15 15 - 41 U/L   ALT 8 (L) 17 - 63 U/L   Alkaline Phosphatase 675 (H) 38 - 126 U/L   Total Bilirubin 0.9 0.3 - 1.2 mg/dL   GFR calc non Af Amer >60 >60 mL/min   GFR calc Af Amer >60 >60 mL/min   Anion gap 6 5 - 15  Prepare RBC     Status: None   Collection Time: 03/14/15  6:12 AM  Result Value Ref Range   Order Confirmation ORDER PROCESSED BY BLOOD BANK   Glucose, capillary     Status: Abnormal   Collection Time: 03/14/15  8:21 AM  Result Value Ref Range   Glucose-Capillary 139 (H) 65 - 99 mg/dL  Prepare RBC     Status: None   Collection Time: 03/14/15 11:49 AM  Result Value Ref Range   Order Confirmation ORDER PROCESSED BY BLOOD BANK      Dg Chest Port 1 View  03/13/2015  CLINICAL DATA:  81 year old male with acute shortness of breath. History of prostate cancer. EXAM: PORTABLE CHEST 1 VIEW COMPARISON:  01/14/2015 and prior chest radiographs dating back to 08/07/2008 FINDINGS: The cardiomediastinal silhouette is unremarkable. Mild left basilar scarring again noted. There is no evidence of focal airspace disease, pulmonary edema, suspicious pulmonary nodule/mass, pleural effusion, or pneumothorax. Sclerosis within the visualized bones again noted. No acute bony abnormalities are identified. IMPRESSION: No evidence of acute abnormality. Electronically Signed   By: [name] M.D.   On: 03/13/2015 18:19    ROS:  As stated above in the HPI otherwise  negative.  Blood pressure 98/58, pulse 74, temperature 98.7 F (37.1 C), temperature source Oral, resp. rate 20, SpO2 100 %.    PE: Gen: NAD, Alert and Oriented HEENT:  [place]/AT, EOMI Neck: Supple, no LAD Lungs: CTA Bilaterally CV: RRR without M/G/R ABM: Soft, NTND, +BS Ext: No C/C/E  Assessment/Plan: 1) Gastric cancer. 2) Symptomatic anemia. 3) Prostate cancer.   I will perform an EGD tomorrow.  His HGB is still too low for me to perform the procedure safely and he is obtaining blood transfusions.  He appears weak.  The PET scan in April was negative for metabolic activity in the gastric region.  It will be prudent to evaluate the area now that it is 6 months later.  There may be a lesion that I can temporize.  Plan: 1) EGD tomorrow. 2) Continue with blood transfusions. Sean Macwilliams D  03/14/2015, 12:43 PM

## 2015-03-15 ENCOUNTER — Encounter (HOSPITAL_COMMUNITY): Payer: Self-pay | Admitting: *Deleted

## 2015-03-15 ENCOUNTER — Encounter (HOSPITAL_COMMUNITY)
Admission: EM | Disposition: A | Discharge status: Home / Self Care | Payer: Self-pay | Source: Home / Self Care | Attending: Internal Medicine

## 2015-03-15 HISTORY — PX: ESOPHAGOGASTRODUODENOSCOPY: SHX5428

## 2015-03-15 LAB — CBC
HCT: 24.2 % — ABNORMAL LOW (ref 39.0–52.0)
HCT: 23.3 % — ABNORMAL LOW (ref 39.0–52.0)
Hemoglobin: 7.8 g/dL — ABNORMAL LOW (ref 13.0–17.0)
Hemoglobin: 7.3 g/dL — ABNORMAL LOW (ref 13.0–17.0)
MCH: 28.8 pg (ref 26.0–34.0)
MCH: 28.1 pg (ref 26.0–34.0)
MCHC: 32.2 g/dL (ref 30.0–36.0)
MCHC: 31.3 g/dL (ref 30.0–36.0)
MCV: 89.3 fL (ref 78.0–100.0)
MCV: 89.6 fL (ref 78.0–100.0)
PLATELETS: 93 10*3/uL — AB (ref 150–400)
Platelets: 81 10*3/uL — ABNORMAL LOW (ref 150–400)
RBC: 2.71 MIL/uL — AB (ref 4.22–5.81)
RBC: 2.6 MIL/uL — ABNORMAL LOW (ref 4.22–5.81)
RDW: 18.1 % — AB (ref 11.5–15.5)
RDW: 18.5 % — AB (ref 11.5–15.5)
WBC: 4.2 10*3/uL (ref 4.0–10.5)
WBC: 3.6 10*3/uL — ABNORMAL LOW (ref 4.0–10.5)

## 2015-03-15 LAB — COMPREHENSIVE METABOLIC PANEL
ALBUMIN: 2.6 g/dL — AB (ref 3.5–5.0)
ALT: 9 U/L — ABNORMAL LOW (ref 17–63)
ANION GAP: 5 (ref 5–15)
AST: 19 U/L (ref 15–41)
Alkaline Phosphatase: 706 U/L — ABNORMAL HIGH (ref 38–126)
BUN: 9 mg/dL (ref 6–20)
CHLORIDE: 107 mmol/L (ref 101–111)
CO2: 24 mmol/L (ref 22–32)
Calcium: 7.9 mg/dL — ABNORMAL LOW (ref 8.9–10.3)
Creatinine, Ser: 0.56 mg/dL — ABNORMAL LOW (ref 0.61–1.24)
GFR calc Af Amer: >60 mL/min (ref >60)
GFR calc non Af Amer: >60 mL/min (ref >60)
GLUCOSE: 117 mg/dL — AB (ref 65–99)
POTASSIUM: 3.3 mmol/L — AB (ref 3.5–5.1)
SODIUM: 136 mmol/L (ref 135–145)
TOTAL PROTEIN: 5 g/dL — AB (ref 6.5–8.1)
Total Bilirubin: 1.5 mg/dL — ABNORMAL HIGH (ref 0.3–1.2)

## 2015-03-15 LAB — GLUCOSE, CAPILLARY: GLUCOSE-CAPILLARY: 112 mg/dL — AB (ref 65–99)

## 2015-03-15 SURGERY — EGD (ESOPHAGOGASTRODUODENOSCOPY)
Anesthesia: Moderate Sedation

## 2015-03-15 MED ORDER — MIDAZOLAM HCL 5 MG/ML IJ SOLN
INTRAMUSCULAR | Status: AC
  Filled 2015-03-15: qty 2

## 2015-03-15 MED ORDER — POTASSIUM CHLORIDE CRYS ER 20 MEQ PO TBCR
40.0000 meq | EXTENDED_RELEASE_TABLET | Freq: Once | ORAL | Status: AC
Start: 2015-03-15 — End: 2015-03-15
  Administered 2015-03-15: 40 meq via ORAL
  Filled 2015-03-15: qty 2

## 2015-03-15 MED ORDER — FENTANYL CITRATE (PF) 100 MCG/2ML IJ SOLN
INTRAMUSCULAR | Status: AC
  Filled 2015-03-15: qty 2

## 2015-03-15 MED ORDER — MIDAZOLAM HCL 10 MG/2ML IJ SOLN
INTRAMUSCULAR | Status: DC | PRN
  Administered 2015-03-15: 2 mg via INTRAVENOUS

## 2015-03-15 MED ORDER — PANTOPRAZOLE SODIUM 40 MG PO TBEC
40.0000 mg | DELAYED_RELEASE_TABLET | Freq: Two times a day (BID) | ORAL | Status: DC
  Administered 2015-03-15 – 2015-03-16 (×3): 40 mg via ORAL
  Filled 2015-03-15 (×3): qty 1

## 2015-03-15 MED ORDER — SODIUM CHLORIDE 0.9 % IV SOLN
INTRAVENOUS | Status: DC
  Administered 2015-03-15: 500 mL via INTRAVENOUS

## 2015-03-15 MED ORDER — FENTANYL CITRATE (PF) 100 MCG/2ML IJ SOLN
INTRAMUSCULAR | Status: DC | PRN
  Administered 2015-03-15: 25 ug via INTRAVENOUS

## 2015-03-15 NOTE — Op Note (Signed)
[facility] [place], [address]   ENDOSCOPY PROCEDURE REPORT  PATIENT: Melvin Gardner, Melvin Gardner  MR#: [id number] BIRTHDATE: May 24, 1929 , 85  yrs. old GENDER: male ENDOSCOPIST:Matayah Reyburn Benson [place], MD REFERRED BY: PROCEDURE DATE:  30-Mar-2015 PROCEDURE:   EGD w/ control of bleeding ASA CLASS:    Class III INDICATIONS: Upper GI Bleed MEDICATION: Versed 2 mg IV and Fentanyl 25 mcg IV TOPICAL ANESTHETIC:   none  DESCRIPTION OF PROCEDURE:   After the risks and benefits of the procedure were explained, informed consent was obtained.  The Pentax Gastroscope [id number]  endoscope was introduced through the mouth  and advanced to the second portion of the duodenum .  The instrument was slowly withdrawn as the mucosa was fully examined. Estimated blood loss is zero unless otherwise noted in this procedure report.   FINDINGS: The esophagus was normal.  No evidence of esophagitis. Upon initial entry into thte gastric lumen fresh blood was identified and suctioned.  In the gastric antrum a bleeding AVM was identified.  Brisk bleeding was found.  Other AVMs were found in the area secondary to radiation treatment.  APC was applied and the bleeding was arrested.  The other AVMs were ablated.  The prior antral gastric cancer was not visible.  Only a scar was in that area.  The duodenum was normal.          The scope was then withdrawn from the patient and the procedure completed.  COMPLICATIONS: There were no immediate complications.  ENDOSCOPIC IMPRESSION: 1) Antral bleeding AVM s/p APC. 2) Healed antral gastric cancer scar.  RECOMMENDATIONS: 1) Follow HGB and transfuse as necessary. 2) Regular diet. _______________________________ Lorrin MaisCarol Ada, MD Mar 30, 2015 8:51 AM    cc: CPT CODES: ICD CODES:

## 2015-03-15 NOTE — Progress Notes (Signed)
Pharmacist Provided - Patient Medication Education Prior to Discharge   Melvin Gardner is an 81 y.o. male who presented to [facility] on 03/13/2015 with a chief complaint of  Chief Complaint  Patient presents with  . Shortness of Breath    The following medications were discussed with the patient:   Pain Control medications: []  Yes    [x]  No  Diabetes Medications: [x]  Yes    []  No  Heart Failure Medications: []  Yes    [x]  No  Anticoagulation Medications:  []  Yes    [x]  No  Antibiotics at discharge: []  Yes    [x]  No  Allergy Assessment Completed and Updated: [x]  Yes    []  No Identified Patient Allergies: No Known Allergies   Medication Adherence Assessment: []  Excellent (no doses missed/week)      []  Good (1 dose missed/week)      []  Partial (2-3 doses missed/week)      []  Poor (>3 doses missed/week)  Barriers to Obtaining Medications: []  Yes [x]  No  Assessment: 81 y/o very pleasant M with anemia likely due to GI bleed. Counseled pt on some of the risks of bleed such as taking NSAIDs and large aspirin doses. Pt takes ASA prn at home so advised him of alternatives like acetaminophen with less bleeding risk. Pt also endorses excellent compliance with metformin/sitagliptin regimen for diabetes. He regularly checks his BG and follows a diabetes diet. Pt was present with niece who also endorses that pt is very compliant and knowledgable of his medications.   Time spent preparing for discharge counseling: 15 min Time spent counseling patient: 20 min  Angela Burke, PharmD Pharmacy Resident Pager: [phone] 03/15/2015, 5:25 PM

## 2015-03-15 NOTE — Progress Notes (Signed)
[facility] PROGRESS NOTE  Melvin Gardner ZOX:[id number] DOB: 10-Mar-1930 DOA: 03/13/2015 PCP: [name]., MD  Length of stay: 2   Assessment/Plan: Active Problems:   Severe anemia   GI bleed  Brief summary 81 year old male with a history of gastric cancer, presents with shortness of breath, initial hemoglobin 4.0, followed by Dr.[name] and  Dr [name].    Hospital course Normocytic anemia likely secondary to upper GI bleeding, history of gastric cancer,EGD showed Antral bleeding AVM s/p APC  patient is status post 4 units of packed red blood cells, Dr. [name] would  Like to monitor CBC for next 24 hrs prior to [place] to po PROTONIX  FOBT positive    History of prostrate cancer on hormone deprivation therapy by Dr. Richardson Chiquito will continue Casodex while in hospital as well as tamsulosin  Type 2 diabetes mellitus-placed on every 4 sliding scale coverage, hold metformin/Januvia  COPD/asthma-continue nebulizers  Dyslipidemia continue Crestor  DVT prophylaxsis SCDs  Code Status:      Code Status Orders        Start     Ordered   03/13/15 2056  Full code   Continuous     03/13/15 2055     Family Communication: family updated about patient's clinical progress Disposition Plan:  Dc in am if cbc stable      Consultants:  Gastroenterology  Procedures:  None  Antibiotics: Anti-infectives    None         HPI/Subjective: No active bleeding overnight  Objective: Filed Vitals:   03/15/15 0835 03/15/15 0840 03/15/15 0849 03/15/15 0850  BP: 130/91 113/71 116/58 100/62  Pulse: 77 79 73 73  Temp:   97.8 F (36.6 C)   TempSrc:   Oral   Resp: 22 17 23 22   SpO2: 100% 100% 100% 100%    Intake/Output Summary (Last 24 hours) at 03/15/15 1131 Last data filed at 03/15/15 0900  Gross per 24 hour  Intake 1225.33 ml  Output   1875 ml  Net -649.67 ml    Exam:  General: No acute respiratory distress Lungs:  Clear to auscultation bilaterally without wheezes or crackles Cardiovascular: Regular rate and rhythm without murmur gallop or rub normal S1 and S2 Abdomen: Nontender, nondistended, soft, bowel sounds positive, no rebound, no ascites, no appreciable mass Extremities: No significant cyanosis, clubbing, or edema bilateral lower extremities     Data Review   Micro Results No results found for this or any previous visit (from the past 240 hour(s)).  Radiology Reports Dg Chest Port 1 View  03/13/2015  CLINICAL DATA:  81 year old male with acute shortness of breath. History of prostate cancer. EXAM: PORTABLE CHEST 1 VIEW COMPARISON:  01/14/2015 and prior chest radiographs dating back to 08/07/2008 FINDINGS: The cardiomediastinal silhouette is unremarkable. Mild left basilar scarring again noted. There is no evidence of focal airspace disease, pulmonary edema, suspicious pulmonary nodule/mass, pleural effusion, or pneumothorax. Sclerosis within the visualized bones again noted. No acute bony abnormalities are identified. IMPRESSION: No evidence of acute abnormality. Electronically Signed   By: [name] M.D.   On: 03/13/2015 18:19     CBC  Recent Labs Lab 03/13/15 1727 03/14/15 0453 03/14/15 1937 03/15/15 0505  WBC 4.2 3.3* 4.2 3.6*  HGB 4.0* 5.2* 7.8* 7.3*  HCT 14.1* 16.9* 24.2* 23.3*  PLT 120* 83* 93* 81*  MCV 95.9 91.4 89.3 89.6  MCH 27.2 28.1 28.8 28.1  MCHC 28.4* 30.8  32.2 31.3  RDW 22.1* 20.0* 18.1* 18.5*    Chemistries   Recent Labs Lab 03/13/15 1727 03/14/15 0453 03/15/15 0505  NA 134* 136 136  K 3.9 3.5 3.3*  CL 105 108 107  CO2 23 22 24   GLUCOSE 141* 133* 117*  BUN 16 12 9   CREATININE 0.52* 0.54* 0.56*  CALCIUM 8.2* 7.8* 7.9*  AST 24 15 19   ALT 10* 8* 9*  ALKPHOS 830* 675* 706*  BILITOT 0.8 0.9 1.5*   ------------------------------------------------------------------------------------------------------------------ CrCl cannot be calculated (Unknown ideal  weight.). ------------------------------------------------------------------------------------------------------------------ No results for input(s): HGBA1C in the last 72 hours. ------------------------------------------------------------------------------------------------------------------ No results for input(s): CHOL, HDL, LDLCALC, TRIG, CHOLHDL, LDLDIRECT in the last 72 hours. ------------------------------------------------------------------------------------------------------------------ No results for input(s): TSH, T4TOTAL, T3FREE, THYROIDAB in the last 72 hours.  Invalid input(s): FREET3 ------------------------------------------------------------------------------------------------------------------ No results for input(s): VITAMINB12, FOLATE, FERRITIN, TIBC, IRON, RETICCTPCT in the last 72 hours.  Coagulation profile  Recent Labs Lab 03/13/15 1727 03/14/15 0453  INR 1.31 1.40    No results for input(s): DDIMER in the last 72 hours.  Cardiac Enzymes  Recent Labs Lab 03/13/15 1727  TROPONINI <0.03   ------------------------------------------------------------------------------------------------------------------ Invalid input(s): POCBNP   CBG:  Recent Labs Lab 03/14/15 0821 03/15/15 0947  GLUCAP 139* 112*       Studies: Dg Chest Port 1 View  03/13/2015  CLINICAL DATA:  81 year old male with acute shortness of breath. History of prostate cancer. EXAM: PORTABLE CHEST 1 VIEW COMPARISON:  01/14/2015 and prior chest radiographs dating back to 08/07/2008 FINDINGS: The cardiomediastinal silhouette is unremarkable. Mild left basilar scarring again noted. There is no evidence of focal airspace disease, pulmonary edema, suspicious pulmonary nodule/mass, pleural effusion, or pneumothorax. Sclerosis within the visualized bones again noted. No acute bony abnormalities are identified. IMPRESSION: No evidence of acute abnormality. Electronically Signed   By: [name]  M.D.   On: 03/13/2015 18:19      Lab Results  Component Value Date   HGBA1C 5.7* 12/03/2014   HGBA1C 6.9* 11/24/2013   HGBA1C * 01/23/2007    6.6 (NOTE)   The ADA recommends the following therapeutic goals for glycemic   control related to Hgb A1C measurement:   Goal of Therapy:   < 7.0% Hgb A1C   Action Suggested:  > 8.0% Hgb A1C   Ref:  Diabetes Care, 22, Suppl. 1, 1999   Lab Results  Component Value Date   LDLCALC * 01/24/2007    114        Total Cholesterol/HDL:CHD Risk Coronary Heart Disease Risk Table                     Men   Women  1/2 Average Risk   3.4   3.3   CREATININE 0.56* 03/15/2015       Scheduled Meds: . sodium chloride   Intravenous Once  . bicalutamide  50 mg Oral Daily  . tamsulosin  0.4 mg Oral QPC breakfast   Continuous Infusions: . sodium chloride 10 mL/hr at 03/13/15 1741  . pantoprozole (PROTONIX) infusion 8 mg/hr (03/14/15 1931)    Active Problems:   Severe anemia   GI bleed    Time spent: 45 minutes   [place] [facility] Pager [phone]. If 7PM-7AM, please contact night-coverage at [email or web address], password [facility] 03/15/2015, 11:31 AM  LOS: 2 days

## 2015-03-15 NOTE — Consult Note (Signed)
[facility] CM Inpatient Consult   03/15/2015  Melvin Gardner [birth year] [id number] Referral received from inpatient RNCM.  Patient was assessed for [facility] for community services. Met with the patient regarding [place] services.  Patient confirmed that his primary care provider is Dr. Willey Blade of [facility].  He states that he is not a patient of Dr. Glendale Chard at [facility] and has not planned to change that.  States, "I've been with Dr. Karlton Lemon every since 'she' started her practice.  Explained to the patient that he may benefit from Care Management services.  Patient politely declined at this time. For questions, please contact: Natividad Brood, RN BSN [facility] Liaison  [phone] business mobile phone

## 2015-03-16 DIAGNOSIS — K254 Chronic or unspecified gastric ulcer with hemorrhage: Secondary | ICD-10-CM | POA: Insufficient documentation

## 2015-03-16 DIAGNOSIS — R600 Localized edema: Secondary | ICD-10-CM | POA: Insufficient documentation

## 2015-03-16 LAB — CBC
HEMATOCRIT: 24.8 % — AB (ref 39.0–52.0)
HEMOGLOBIN: 7.8 g/dL — AB (ref 13.0–17.0)
MCH: 28.8 pg (ref 26.0–34.0)
MCHC: 31.5 g/dL (ref 30.0–36.0)
MCV: 91.5 fL (ref 78.0–100.0)
PLATELETS: 78 10*3/uL — AB (ref 150–400)
RBC: 2.71 MIL/uL — AB (ref 4.22–5.81)
RDW: 18.6 % — ABNORMAL HIGH (ref 11.5–15.5)
WBC: 4.9 10*3/uL (ref 4.0–10.5)

## 2015-03-16 LAB — GLUCOSE, CAPILLARY: Glucose-Capillary: 120 mg/dL — ABNORMAL HIGH (ref 65–99)

## 2015-03-16 MED ORDER — FUROSEMIDE 10 MG/ML IJ SOLN
20.0000 mg | Freq: Once | INTRAMUSCULAR | Status: DC
  Filled 2015-03-16: qty 2

## 2015-03-16 NOTE — Evaluation (Signed)
Physical Therapy Evaluation and D/C Patient Details Name: Melvin Gardner MRN: [id number] DOB: Jul 03, 1929 Today's Date: 03/16/2015   History of Present Illness  81 y/o very pleasant M with anemia likely due to GI bleed.   Clinical Impression  Pt admitted with above diagnosis. Pt currently without significant  functional limitations and can go home with addition of using RW at home at all times for safety as he is Modif I with RW in controlled environment.  Pt agrees.  Also recommend HHPT safety eval.  D/c today with above plan in place for f/u.  Thanks.   Follow Up Recommendations Home health PT;Supervision - Intermittent (safety eval)    Equipment Recommendations  Rolling walker with 5" wheels    Recommendations for Other Services       Precautions / Restrictions Precautions Precautions: Fall Restrictions Weight Bearing Restrictions: No      Mobility  Bed Mobility Overal bed mobility: Independent                Transfers Overall transfer level: Independent                  Ambulation/Gait Ambulation/Gait assistance: Supervision;Min guard Ambulation Distance (Feet): 350 Feet Assistive device: Rolling walker (2 wheeled);None Gait Pattern/deviations: Step-through pattern;Decreased stride length;Wide base of support;Trunk flexed   Gait velocity interpretation: Below normal speed for age/gender General Gait Details: Pt with bil LE weakness with bil LEs flexed knees at all times instanding.  Friend that is present states that pts knees buckle at times.  Pt ambulates without device needing min guard assist for safety with pt reaching for furniture and for PT hand.  Pt much steadier with RW.  Discussed rollator versus RW but decided on RW due to pt may not be able to lock brakes and sit down on rollator without having LOB.  Pt is safe with RW in controlled setting.    Stairs            Wheelchair Mobility    Modified Rankin (Stroke Patients Only)        Balance Overall balance assessment: Needs assistance;History of Falls         Standing balance support: Bilateral upper extremity supported;During functional activity Standing balance-Leahy Scale: Poor Standing balance comment: relies on at least single UE support for balance, especially with challenges.                              Pertinent Vitals/Pain Pain Assessment: No/denies pain  VSS with sats >95% on RA.      Home Living Family/patient expects to be discharged to:: Private residence Living Arrangements: Alone Available Help at Discharge: Family;Friend(s);Personal care attendant ([facility] 7 days a week 2 hours day) Type of Home: Independent living facility Home Access: Level entry     Home Layout: Two level Home Equipment: Cane - single point (heavy duty borrowed RW that pt cant use)      Prior Function Level of Independence: Independent with assistive device(s)         Comments: Uses cane full-time     Hand Dominance        Extremity/Trunk Assessment   Upper Extremity Assessment: Defer to OT evaluation           Lower Extremity Assessment: Generalized weakness      Cervical / Trunk Assessment: Kyphotic  Communication   Communication: No difficulties  Cognition Arousal/Alertness: Awake/alert Behavior During Therapy: WFL for tasks  assessed/performed Overall Cognitive Status: Within Functional Limits for tasks assessed                      General Comments      Exercises        Assessment/Plan    PT Assessment All further PT needs can be met in the next venue of care (recommend HHPT safety eval)  PT Diagnosis Generalized weakness   PT Problem List Decreased mobility;Decreased activity tolerance;Decreased knowledge of use of DME;Decreased safety awareness  PT Treatment Interventions     PT Goals (Current goals can be found in the Care Plan section) Acute Rehab PT Goals Patient Stated Goal: to go home PT Goal  Formulation: All assessment and education complete, DC therapy    Frequency     Barriers to discharge        Co-evaluation               End of Session Equipment Utilized During Treatment: Gait belt Activity Tolerance: Patient tolerated treatment well Patient left: in chair;with call bell/phone within reach;with family/visitor present Nurse Communication: Mobility status         Time: 8403-7543 PT Time Calculation (min) (ACUTE ONLY): 21 min   Charges:   PT Evaluation $Initial PT Evaluation Tier I: 1 Procedure     PT G CodesDenice Paradise March 17, 2015, 3:53 PM [facility] Acute Rehabilitation [phone] (pager)

## 2015-03-16 NOTE — Progress Notes (Signed)
SATURATION QUALIFICATIONS: (This note is used to comply with regulatory documentation for home oxygen)  Patient Saturations on Room Air at Rest = 100%  Patient Saturations on Room Air while Ambulating = 100%  

## 2015-03-16 NOTE — Discharge Summary (Signed)
Physician Discharge Summary  Melvin Gardner MRN: [id number] DOB/AGE: [birth year] 81 y.o.  PCP: [name]., MD   Admit date: 03/13/2015 Discharge date: 03/16/2015  Discharge Diagnoses:     Active Problems:   Severe anemia   GI bleed   Bilateral leg edema   Gastrointestinal hemorrhage associated with gastric ulcer    Follow-up recommendations Follow-up with PCP in 3-5 days , including all  additional recommended appointments as below Follow-up CBC, CMP in 3-5 days Patient needs to follow-up with Melvin Portela, MD for his thrombocytopenia     Medication List    STOP taking these medications        aspirin 325 MG tablet      TAKE these medications        albuterol 108 (90 BASE) MCG/ACT inhaler  Commonly known as:  PROVENTIL HFA;VENTOLIN HFA  Inhale 2 puffs into the lungs every 6 (six) hours as needed for wheezing.     albuterol (2.5 MG/3ML) 0.083% nebulizer solution  Commonly known as:  PROVENTIL  Take 2.5 mg by nebulization every 6 (six) hours as needed for wheezing.     bicalutamide 50 MG tablet  Commonly known as:  CASODEX  Take 50 mg by mouth daily.     esomeprazole 40 MG packet  Commonly known as:  NEXIUM  Take 40 mg by mouth daily before breakfast.     ferrous sulfate 325 (65 FE) MG tablet  Take 325 mg by mouth daily with breakfast.     Fluticasone-Salmeterol 250-50 MCG/DOSE Aepb  Commonly known as:  ADVAIR  Inhale 1 puff into the lungs every 12 (twelve) hours.     metFORMIN 500 MG tablet  Commonly known as:  GLUCOPHAGE  Take 1,000 mg by mouth 2 (two) times daily with a meal.     rosuvastatin 10 MG tablet  Commonly known as:  CRESTOR  Take 10 mg by mouth daily.     sitaGLIPtin 50 MG tablet  Commonly known as:  JANUVIA  Take 50 mg by mouth daily.     tamsulosin 0.4 MG Caps capsule  Commonly known as:  FLOMAX  Take 0.4 mg by mouth daily after breakfast.         Discharge Condition:   Discharge Instructions       Discharge  Instructions    Diet - low sodium heart healthy    Complete by:  As directed      Increase activity slowly    Complete by:  As directed            No Known Allergies    Disposition: 01-Home or Self Care   Consults:   gastroenterology   Significant Diagnostic Studies:  Dg Chest Port 1 View  03/13/2015  CLINICAL DATA:  81 year old year old male with acute shortness of breath. History of prostate cancer. EXAM: PORTABLE CHEST 1 VIEW COMPARISON:  01/14/2015 and prior chest radiographs dating back to 08/07/2008 FINDINGS: The cardiomediastinal silhouette is unremarkable. Mild left basilar scarring again noted. There is no evidence of focal airspace disease, pulmonary edema, suspicious pulmonary nodule/mass, pleural effusion, or pneumothorax. Sclerosis within the visualized bones again noted. No acute bony abnormalities are identified. IMPRESSION: No evidence of acute abnormality. Electronically Signed   By: [name] M.D.   On: 03/13/2015 18:19       There were no vitals filed for this visit.   Microbiology: No results found for this or any previous visit (from the past 240 hour(s)).     Blood  Culture    Component Value Date/Time   SDES URINE, RANDOM 12/03/2014 1700   SDES URINE, RANDOM 12/03/2014 1700   SPECREQUEST NONE 12/03/2014 1700   SPECREQUEST NONE 12/03/2014 1700   CULT NO GROWTH 2 DAYS 12/03/2014 1700   REPTSTATUS 12/05/2014 FINAL 12/03/2014 1700   REPTSTATUS 12/05/2014 FINAL 12/03/2014 1700      Labs: Results for orders placed or performed during the hospital encounter of 03/13/15 (from the past 48 hour(s))  Prepare RBC     Status: None   Collection Time: 03/14/15 11:49 AM  Result Value Ref Range   Order Confirmation ORDER PROCESSED BY BLOOD BANK   CBC     Status: Abnormal   Collection Time: 03/14/15  7:37 PM  Result Value Ref Range   WBC 4.2 4.0 - 10.5 K/uL   RBC 2.71 (L) 4.22 - 5.81 MIL/uL   Hemoglobin 7.8 (L) 13.0 - 17.0 g/dL    Comment: POST TRANSFUSION  SPECIMEN REPEATED TO VERIFY    HCT 24.2 (L) 39.0 - 52.0 %   MCV 89.3 78.0 - 100.0 fL   MCH 28.8 26.0 - 34.0 pg   MCHC 32.2 30.0 - 36.0 g/dL   RDW 49.9 (H) 08.3 - 24.7 %   Platelets 93 (L) 150 - 400 K/uL    Comment: CONSISTENT WITH PREVIOUS RESULT  CBC     Status: Abnormal   Collection Time: 03/15/15  5:05 AM  Result Value Ref Range   WBC 3.6 (L) 4.0 - 10.5 K/uL   RBC 2.60 (L) 4.22 - 5.81 MIL/uL   Hemoglobin 7.3 (L) 13.0 - 17.0 g/dL   HCT 80.3 (L) 75.0 - 42.3 %   MCV 89.6 78.0 - 100.0 fL   MCH 28.1 26.0 - 34.0 pg   MCHC 31.3 30.0 - 36.0 g/dL   RDW 78.2 (H) 76.9 - 70.4 %   Platelets 81 (L) 150 - 400 K/uL    Comment: CONSISTENT WITH PREVIOUS RESULT  Comprehensive metabolic panel     Status: Abnormal   Collection Time: 03/15/15  5:05 AM  Result Value Ref Range   Sodium 136 135 - 145 mmol/L   Potassium 3.3 (L) 3.5 - 5.1 mmol/L   Chloride 107 101 - 111 mmol/L   CO2 24 22 - 32 mmol/L   Glucose, Bld 117 (H) 65 - 99 mg/dL   BUN 9 6 - 20 mg/dL   Creatinine, Ser 4.44 (L) 0.61 - 1.24 mg/dL   Calcium 7.9 (L) 8.9 - 10.3 mg/dL   Total Protein 5.0 (L) 6.5 - 8.1 g/dL   Albumin 2.6 (L) 3.5 - 5.0 g/dL   AST 19 15 - 41 U/L   ALT 9 (L) 17 - 63 U/L   Alkaline Phosphatase 706 (H) 38 - 126 U/L   Total Bilirubin 1.5 (H) 0.3 - 1.2 mg/dL   GFR calc non Af Amer >60 >60 mL/min   GFR calc Af Amer >60 >60 mL/min    Comment: (NOTE) The eGFR has been calculated using the CKD EPI equation. This calculation has not been validated in all clinical situations. eGFR's persistently <60 mL/min signify possible Chronic Kidney Disease.    Anion gap 5 5 - 15  Glucose, capillary     Status: Abnormal   Collection Time: 03/15/15  9:47 AM  Result Value Ref Range   Glucose-Capillary 112 (H) 65 - 99 mg/dL  CBC     Status: Abnormal   Collection Time: 03/16/15  5:14 AM  Result Value Ref Range  WBC 4.9 4.0 - 10.5 K/uL   RBC 2.71 (L) 4.22 - 5.81 MIL/uL   Hemoglobin 7.8 (L) 13.0 - 17.0 g/dL   HCT 24.8 (L) 39.0  - 52.0 %   MCV 91.5 78.0 - 100.0 fL   MCH 28.8 26.0 - 34.0 pg   MCHC 31.5 30.0 - 36.0 g/dL   RDW 18.6 (H) 11.5 - 15.5 %   Platelets 78 (L) 150 - 400 K/uL    Comment: CONSISTENT WITH PREVIOUS RESULT  Glucose, capillary     Status: Abnormal   Collection Time: 03/16/15  8:09 AM  Result Value Ref Range   Glucose-Capillary 120 (H) 65 - 99 mg/dL     Lipid Panel     Component Value Date/Time   CHOL  01/24/2007 0505    159        ATP III CLASSIFICATION:  <200     mg/dL   Desirable  200-239  mg/dL   Borderline High  >=240    mg/dL   High   TRIG 59 01/24/2007 0505   HDL 33* 01/24/2007 0505   CHOLHDL 4.8 01/24/2007 0505   VLDL 12 01/24/2007 0505   LDLCALC * 01/24/2007 0505    114        Total Cholesterol/HDL:CHD Risk Coronary Heart Disease Risk Table                     Men   Women  1/2 Average Risk   3.4   3.3     Lab Results  Component Value Date   HGBA1C 5.7* 12/03/2014   HGBA1C 6.9* 11/24/2013   HGBA1C * 01/23/2007    6.6 (NOTE)   The ADA recommends the following therapeutic goals for glycemic   control related to Hgb A1C measurement:   Goal of Therapy:   < 7.0% Hgb A1C   Action Suggested:  > 8.0% Hgb A1C   Ref:  Diabetes Care, 22, Suppl. 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 His stool is formed and black for the past month. The patient only takes ASA. His HGB upon admission was 4 g/dL with an INR of 1.3. The patient did receive radiation treatment for his gastric cancer and the last PET scan did not show any activity in his gastric region (08/2014). Platelets 120 upon admission  HOSPITAL COURSE:  Normocytic  anemia likely secondary to upper GI bleeding, history of gastric cancer,EGD showed Antral bleeding AVM s/p APC, hemoglobin stable for 2 days. No active bleeding overnight. Patient will be discharged today patient is status post 4 units of packed red blood cells, patient needs to follow-up with Dr. [name] Needs a repeat CBC in 2-3 days Continue PPi  Thrombocytopenia-platelet count has been steadily declining since July, patient needs follow-up with Dr. [name], MD his oncologist for further evaluation of his thrombocytopenia. Patient's aspirin has been discontinued.   History of prostrate cancer on hormone deprivation therapy by Dr. Richardson Chiquito  will continue Casodex while in hospital as well as tamsulosin,also followed by Melvin Portela, MD  Type 2 diabetes mellitus-placed on every 4 sliding scale coverage, hold metformin/Januvia  COPD/asthma-continue nebulizers  Dyslipidemia continue Crestor  Discharge Exam: *   Blood pressure 120/69, pulse 87, temperature 98.4 F (36.9 C), temperature source Oral, resp. rate 16, SpO2 100 %.  General: No acute respiratory distress Lungs: Clear to auscultation bilaterally without wheezes or crackles Cardiovascular: Regular rate and rhythm without murmur gallop or rub normal S1 and S2 Abdomen: Nontender, nondistended, soft, bowel sounds positive, no rebound, no ascites, no appreciable mass Extremities: No significant cyanosis, clubbing, or edema bilateral lower extremities    Follow-up Information    Follow up with [name]., MD. Schedule an appointment as soon as possible for a visit in 1 week.   Specialty:  Internal Medicine   Contact information:   [address] [place] [address] [phone]       Follow up with Beryle Beams, MD. Schedule an appointment as soon as possible for a visit in 1 week.   Specialty:  Gastroenterology   Contact information:   [address], [place]  [address] [phone]       Signed: Reyne Dumas 03/16/2015, 10:41 AM        Time spent >45 mins

## 2015-03-16 NOTE — Care Management Note (Signed)
Case Management Note  Patient Details  Name: STILES MAXCY MRN: [id number] Date of Birth: 02/18/1930  Subjective/Objective:                    GI bleed Action/Plan:  Discharge planning Expected Discharge Date:  03/16/15               Expected Discharge Plan:  [place]  In-House Referral:     Discharge planning Services  CM Consult  Post Acute Care Choice:  Home Health Choice offered to:  Patient  DME Arranged:  Walker rolling DME Agency:  [facility] Arranged:  PT, OT [facility] Agency:  [place]  Status of Service:  Completed, signed off  Medicare Important Message Given:    Date Medicare IM Given:    Medicare IM give by:    Date Additional Medicare IM Given:    Additional Medicare Important Message give by:     If discussed at [place] of Stay Meetings, dates discussed:    Additional Comments: CM spoke with pt to offer choice of home heath agency.  Pt chooses [facility] to render HHPT/OT.  Cm called [facility] [employer] rep, Merry Proud to please deliver the rolling walker to room so pt can discharge.  Referral called to [facility] rep, Tiffany. No other CM needs were communicated. Dellie Catholic, RN 03/16/2015, 3:25 PM

## 2015-03-16 NOTE — Progress Notes (Signed)
NURSING PROGRESS NOTE  Melvin Gardner [id number] Discharge Data: 03/16/2015 11:36 AM Attending Provider: Reyne Dumas, MD RCB:[email or web address],IWOEHOZY R., MD     [name] to be D/C'd Home per MD order.  Discussed with the patient the After Visit Summary and all questions fully answered. All IV's discontinued with no bleeding noted. All belongings returned to patient for patient to take home.   Last Vital Signs:  Blood pressure 93/51, pulse 75, temperature 98.4 F (36.9 C), temperature source Oral, resp. rate 16, SpO2 100 %.  Discharge Medication List   Medication List    STOP taking these medications        aspirin 325 MG tablet      TAKE these medications        albuterol 108 (90 BASE) MCG/ACT inhaler  Commonly known as:  PROVENTIL HFA;VENTOLIN HFA  Inhale 2 puffs into the lungs every 6 (six) hours as needed for wheezing.     albuterol (2.5 MG/3ML) 0.083% nebulizer solution  Commonly known as:  PROVENTIL  Take 2.5 mg by nebulization every 6 (six) hours as needed for wheezing.     bicalutamide 50 MG tablet  Commonly known as:  CASODEX  Take 50 mg by mouth daily.     esomeprazole 40 MG packet  Commonly known as:  NEXIUM  Take 40 mg by mouth daily before breakfast.     ferrous sulfate 325 (65 FE) MG tablet  Take 325 mg by mouth daily with breakfast.     Fluticasone-Salmeterol 250-50 MCG/DOSE Aepb  Commonly known as:  ADVAIR  Inhale 1 puff into the lungs every 12 (twelve) hours.     metFORMIN 500 MG tablet  Commonly known as:  GLUCOPHAGE  Take 1,000 mg by mouth 2 (two) times daily with a meal.     rosuvastatin 10 MG tablet  Commonly known as:  CRESTOR  Take 10 mg by mouth daily.     sitaGLIPtin 50 MG tablet  Commonly known as:  JANUVIA  Take 50 mg by mouth daily.     tamsulosin 0.4 MG Caps capsule  Commonly known as:  FLOMAX  Take 0.4 mg by mouth daily after breakfast.         Charolette Child, RN

## 2015-03-17 LAB — TYPE AND SCREEN
ABO/RH(D): O POS
Antibody Screen: NEGATIVE
UNIT DIVISION: 0
Unit division: 0
Unit division: 0
Unit division: 0
Unit division: 0

## 2015-03-18 ENCOUNTER — Encounter (HOSPITAL_COMMUNITY): Payer: Self-pay | Admitting: Gastroenterology

## 2015-03-25 ENCOUNTER — Telehealth: Payer: Self-pay | Admitting: Oncology

## 2015-03-25 NOTE — Telephone Encounter (Signed)
Returned call re r/s appointment. Gave patient new appointment for 12/14.

## 2015-03-26 ENCOUNTER — Ambulatory Visit: Payer: Medicare Other | Admitting: Oncology

## 2015-03-26 ENCOUNTER — Other Ambulatory Visit: Payer: Medicare Other

## 2015-05-01 ENCOUNTER — Other Ambulatory Visit: Payer: Medicare Other

## 2015-05-01 ENCOUNTER — Ambulatory Visit: Payer: Medicare Other | Admitting: Oncology

## 2015-05-06 ENCOUNTER — Telehealth: Payer: Self-pay | Admitting: Oncology

## 2015-05-06 NOTE — Telephone Encounter (Signed)
Returned call to patient re message left about patient not knowing when appointment. Per patient he does not wish to r/s at this time.

## 2015-06-24 IMAGING — CT NM PET TUM IMG INITIAL (PI) SKULL BASE T - THIGH
1 of 8 series · 1 of 25 positions shown · non-contrast
Comparison: Bone scan 01/16/2010

CLINICAL DATA: Initial treatment strategy for gastric carcinoma.
Prior history of prostate carcinoma.

EXAM:
NUCLEAR MEDICINE PET SKULL BASE TO THIGH
TECHNIQUE: 10 mCi F-18 FDG was injected intravenously. Full-ring PET imaging
was performed from the skull base to thigh after the radiotracer. CT
data was obtained and used for attenuation correction and anatomic
localization.
FASTING BLOOD GLUCOSE:  Value: 130 mg/dl

[Series 4: ct sk_thigh 5.0 b31f · axial · 5.0mm · 0.98mm/px · 1 of 232 slices shown]
[im 232/232  brain]
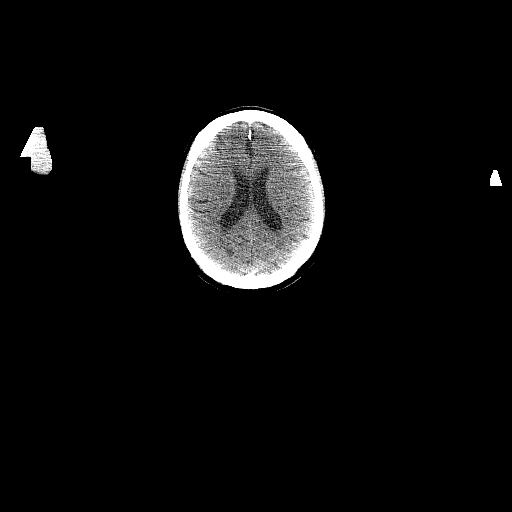

[1 of 25 positions shown; findings below may reference images not displayed]

FINDINGS: NECK

No hypermetabolic lymph nodes in the neck.

CHEST

No hypermetabolic mediastinal or hilar nodes. No suspicious
pulmonary nodules on the CT scan.

ABDOMEN/PELVIS

There is no hypermetabolic activity within the stomach. No
hypermetabolic gastro hepatic ligament lymph nodes. No abnormal
metabolic liver. No hypermetabolic abdominal pelvic lymph nodes.

SKELETON

There is diffuse sclerotic metastasis without metabolic activity
throughout the spine and pelvis consists with treated metastasis.
However, there is a hypermetabolic lesion within the T8 vertebral
body with SUV max equal 5.3. There is no corresponding lesion on the
CT portion exam. Additionally there is a hypermetabolic focus
associated with the left lateral fifth rib (image 73).
IMPRESSION: 1. No abnormal metabolic activity in the stomach.
2. No evidence of local nodal metastasis or liver metastasis.
3. Hypermetabolic lesion the T8 vertebral body is consistent with
metastatic lesion. Would have to favor prostate cancer metastasis
over gastric skeletal metastasis. Recommend correlation with PSA.
4. Metabolic activity associated with a of the lateral aspect of the
left fifth rib is also concerning for metastasis.
5. Widespread sclerotic skeletal metastasis without metabolic
activity consistent with inactive / treated skeletal metastasis.

## 2015-07-04 ENCOUNTER — Inpatient Hospital Stay (HOSPITAL_COMMUNITY)
Admission: EM | Admit: 2015-07-04 | Discharge: 2015-07-07 | DRG: 378 | Disposition: A | Discharge status: Home / Self Care | Payer: Medicare Other | Attending: Internal Medicine | Admitting: Internal Medicine

## 2015-07-04 ENCOUNTER — Inpatient Hospital Stay (HOSPITAL_COMMUNITY): Payer: Medicare Other

## 2015-07-04 ENCOUNTER — Encounter (HOSPITAL_COMMUNITY): Payer: Self-pay | Admitting: Emergency Medicine

## 2015-07-04 DIAGNOSIS — E118 Type 2 diabetes mellitus with unspecified complications: Secondary | ICD-10-CM | POA: Diagnosis not present

## 2015-07-04 DIAGNOSIS — K922 Gastrointestinal hemorrhage, unspecified: Principal | ICD-10-CM | POA: Diagnosis present

## 2015-07-04 DIAGNOSIS — D62 Acute posthemorrhagic anemia: Secondary | ICD-10-CM | POA: Diagnosis present

## 2015-07-04 DIAGNOSIS — E785 Hyperlipidemia, unspecified: Secondary | ICD-10-CM | POA: Diagnosis present

## 2015-07-04 DIAGNOSIS — J439 Emphysema, unspecified: Secondary | ICD-10-CM | POA: Diagnosis present

## 2015-07-04 DIAGNOSIS — D649 Anemia, unspecified: Secondary | ICD-10-CM | POA: Diagnosis not present

## 2015-07-04 DIAGNOSIS — R0600 Dyspnea, unspecified: Secondary | ICD-10-CM | POA: Diagnosis present

## 2015-07-04 DIAGNOSIS — Z923 Personal history of irradiation: Secondary | ICD-10-CM

## 2015-07-04 DIAGNOSIS — Z85028 Personal history of other malignant neoplasm of stomach: Secondary | ICD-10-CM | POA: Diagnosis not present

## 2015-07-04 DIAGNOSIS — Z87891 Personal history of nicotine dependence: Secondary | ICD-10-CM

## 2015-07-04 DIAGNOSIS — Z7984 Long term (current) use of oral hypoglycemic drugs: Secondary | ICD-10-CM | POA: Diagnosis not present

## 2015-07-04 DIAGNOSIS — C61 Malignant neoplasm of prostate: Secondary | ICD-10-CM | POA: Diagnosis present

## 2015-07-04 DIAGNOSIS — E119 Type 2 diabetes mellitus without complications: Secondary | ICD-10-CM

## 2015-07-04 DIAGNOSIS — J449 Chronic obstructive pulmonary disease, unspecified: Secondary | ICD-10-CM | POA: Diagnosis present

## 2015-07-04 DIAGNOSIS — Z833 Family history of diabetes mellitus: Secondary | ICD-10-CM | POA: Diagnosis not present

## 2015-07-04 DIAGNOSIS — K921 Melena: Secondary | ICD-10-CM | POA: Diagnosis present

## 2015-07-04 DIAGNOSIS — D6489 Other specified anemias: Secondary | ICD-10-CM | POA: Diagnosis not present

## 2015-07-04 DIAGNOSIS — C162 Malignant neoplasm of body of stomach: Secondary | ICD-10-CM | POA: Diagnosis not present

## 2015-07-04 DIAGNOSIS — J45909 Unspecified asthma, uncomplicated: Secondary | ICD-10-CM | POA: Diagnosis present

## 2015-07-04 DIAGNOSIS — K264 Chronic or unspecified duodenal ulcer with hemorrhage: Secondary | ICD-10-CM | POA: Diagnosis not present

## 2015-07-04 DIAGNOSIS — K2901 Acute gastritis with bleeding: Secondary | ICD-10-CM | POA: Diagnosis not present

## 2015-07-04 LAB — CBC WITH DIFFERENTIAL/PLATELET
Basophils Absolute: 0 10*3/uL (ref 0.0–0.1)
Basophils Relative: 0 %
EOS PCT: 0 %
Eosinophils Absolute: 0 10*3/uL (ref 0.0–0.7)
HEMATOCRIT: 20.5 % — AB (ref 39.0–52.0)
HEMOGLOBIN: 6.1 g/dL — AB (ref 13.0–17.0)
Lymphocytes Relative: 7 %
Lymphs Abs: 0.3 10*3/uL — ABNORMAL LOW (ref 0.7–4.0)
MCH: 26.3 pg (ref 26.0–34.0)
MCHC: 30.2 g/dL (ref 30.0–36.0)
MCV: 86.9 fL (ref 78.0–100.0)
MONOS PCT: 8 %
Monocytes Absolute: 0.4 10*3/uL (ref 0.1–1.0)
NEUTROS PCT: 85 %
Neutro Abs: 4.2 10*3/uL (ref 1.7–7.7)
Platelets: 107 10*3/uL — ABNORMAL LOW (ref 150–400)
RBC: 2.36 MIL/uL — AB (ref 4.22–5.81)
RDW: 21.8 % — ABNORMAL HIGH (ref 11.5–15.5)
WBC: 4.9 10*3/uL (ref 4.0–10.5)

## 2015-07-04 LAB — BASIC METABOLIC PANEL
ANION GAP: 12 (ref 5–15)
BUN: 10 mg/dL (ref 6–20)
CO2: 22 mmol/L (ref 22–32)
Calcium: 8.2 mg/dL — ABNORMAL LOW (ref 8.9–10.3)
Chloride: 106 mmol/L (ref 101–111)
Creatinine, Ser: 0.54 mg/dL — ABNORMAL LOW (ref 0.61–1.24)
GFR calc Af Amer: >60 mL/min (ref >60)
GLUCOSE: 105 mg/dL — AB (ref 65–99)
POTASSIUM: 3.8 mmol/L (ref 3.5–5.1)
Sodium: 140 mmol/L (ref 135–145)

## 2015-07-04 LAB — CBC
HEMATOCRIT: 24.3 % — AB (ref 39.0–52.0)
Hemoglobin: 7.5 g/dL — ABNORMAL LOW (ref 13.0–17.0)
MCH: 26.4 pg (ref 26.0–34.0)
MCHC: 30.9 g/dL (ref 30.0–36.0)
MCV: 85.6 fL (ref 78.0–100.0)
PLATELETS: 89 10*3/uL — AB (ref 150–400)
RBC: 2.84 MIL/uL — ABNORMAL LOW (ref 4.22–5.81)
RDW: 19.7 % — AB (ref 11.5–15.5)
WBC: 5.9 10*3/uL (ref 4.0–10.5)

## 2015-07-04 LAB — PROTIME-INR
INR: 1.44 (ref 0.00–1.49)
PROTHROMBIN TIME: 17.6 s — AB (ref 11.6–15.2)

## 2015-07-04 LAB — BRAIN NATRIURETIC PEPTIDE: B Natriuretic Peptide: 132.3 pg/mL — ABNORMAL HIGH (ref 0.0–100.0)

## 2015-07-04 LAB — PREPARE RBC (CROSSMATCH)

## 2015-07-04 LAB — POC OCCULT BLOOD, ED: Fecal Occult Bld: POSITIVE — AB

## 2015-07-04 LAB — APTT: aPTT: 38 seconds — ABNORMAL HIGH (ref 24–37)

## 2015-07-04 MED ORDER — ONDANSETRON HCL 4 MG PO TABS: 4.0000 mg | ORAL_TABLET | Freq: Four times a day (QID) | ORAL | Status: DC | PRN

## 2015-07-04 MED ORDER — SODIUM CHLORIDE 0.9 % IV SOLN
INTRAVENOUS | Status: AC
  Administered 2015-07-05: 02:00:00 via INTRAVENOUS

## 2015-07-04 MED ORDER — SODIUM CHLORIDE 0.9 % IV SOLN
10.0000 mL/h | Freq: Once | INTRAVENOUS | Status: AC
  Administered 2015-07-04: 10 mL/h via INTRAVENOUS

## 2015-07-04 MED ORDER — SENNOSIDES-DOCUSATE SODIUM 8.6-50 MG PO TABS: 1.0000 | ORAL_TABLET | Freq: Every evening | ORAL | Status: DC | PRN

## 2015-07-04 MED ORDER — ALBUTEROL SULFATE (2.5 MG/3ML) 0.083% IN NEBU
5.0000 mg | INHALATION_SOLUTION | Freq: Once | RESPIRATORY_TRACT | Status: AC
Start: 2015-07-04 — End: 2015-07-04
  Administered 2015-07-04: 5 mg via RESPIRATORY_TRACT
  Filled 2015-07-04: qty 6

## 2015-07-04 MED ORDER — INSULIN ASPART 100 UNIT/ML ~~LOC~~ SOLN
0.0000 [IU] | SUBCUTANEOUS | Status: DC
  Administered 2015-07-05 – 2015-07-06 (×4): 1 [IU] via SUBCUTANEOUS
  Administered 2015-07-06: 2 [IU] via SUBCUTANEOUS
  Administered 2015-07-07: 1 [IU] via SUBCUTANEOUS
  Administered 2015-07-07: 2 [IU] via SUBCUTANEOUS

## 2015-07-04 MED ORDER — PANTOPRAZOLE SODIUM 40 MG IV SOLR
40.0000 mg | Freq: Two times a day (BID) | INTRAVENOUS | Status: DC
  Administered 2015-07-04 – 2015-07-06 (×3): 40 mg via INTRAVENOUS
  Filled 2015-07-04 (×3): qty 40

## 2015-07-04 MED ORDER — FUROSEMIDE 10 MG/ML IJ SOLN
40.0000 mg | Freq: Once | INTRAMUSCULAR | Status: AC
  Administered 2015-07-04: 40 mg via INTRAVENOUS
  Filled 2015-07-04: qty 4

## 2015-07-04 MED ORDER — ONDANSETRON HCL 4 MG/2ML IJ SOLN: 4.0000 mg | Freq: Four times a day (QID) | INTRAMUSCULAR | Status: DC | PRN

## 2015-07-04 MED ORDER — ACETAMINOPHEN 650 MG RE SUPP: 650.0000 mg | Freq: Four times a day (QID) | RECTAL | Status: DC | PRN

## 2015-07-04 MED ORDER — ACETAMINOPHEN 325 MG PO TABS: 650.0000 mg | ORAL_TABLET | Freq: Four times a day (QID) | ORAL | Status: DC | PRN

## 2015-07-04 NOTE — ED Notes (Signed)
Consent obtained for blood 

## 2015-07-04 NOTE — ED Notes (Signed)
Ordered clear liquid tray for pt

## 2015-07-04 NOTE — ED Notes (Signed)
Pt coming from MD office. Pt has hx anemia, MD concerned pt is anemic today. Pt denies complaints today, denies weakness. MD did not draw labs at hospital. Orthostatics at MD office laying BP 100/70, standing102/70 at MD office. CBG 164. HR 88.

## 2015-07-04 NOTE — H&P (Signed)
[facility] History and Physical  DONZELL HAMLEY [id number] DOB: [birth year] DOA: 07/04/2015  Referring physician: Emergency Department PCP: [name]., MD   CHIEF COMPLAINT:                   HPI: Melvin Gardner is a 82 y.o. male with diabetes and COPD, CAP prostate cancer as well as gastric cancer s/p radiation.[name] He was admitted late October with symptomatic anemia, hemoglobin was 4 at the time. He had an upper endoscopy with APC of a bleeding AVM. No evidence of gastric cancer found.  Patient received 4 units of blood. Hgb around discharge was 7.8.   Patient sent by PCP to ED today for anemia. Patient reports black stools over the last month.  No iron or bismuth products  Hgb is 6.1 in ED. no abdominal pain, no nausea. Appetite is okay. Patient takes a full aspirin, almost daily. No other NSAIDs. Patient states he feels fine. No dizziness, weakness or shortness of breath.   ED COURSE:           Labs:   11 6.1, wbc 4.9, platelet 107 BUN 10, creatinine 0.5  Medications - No data to display   Review of Systems  Constitutional: Negative.   HENT: Negative.   Eyes: Negative.   Respiratory: Negative.   Cardiovascular: Negative.   Gastrointestinal: Positive for melena.  Genitourinary: Negative.   Musculoskeletal: Negative.   Skin: Negative.   Neurological: Negative.   Endo/Heme/Allergies: Negative.   Psychiatric/Behavioral: Negative.     Past Medical History  Diagnosis Date  . Asthma   . High cholesterol   . Type II diabetes mellitus ([place])   . Anemia   . Arthritis     "left knee" (11/23/2013)  . COPD (chronic obstructive pulmonary disease) ([place])     [name] 09/17/2010  . Prostate cancer ([place]) 06/03/2001    bilat involvement,adenocarcinoma,gleason 6(3+3) PSA=37.6  . Stomach cancer ([place]) 11/24/13    adenocarcinoma   . History of blood transfusion 11/23/2013    related to lower GI bleeding  . History of radiation therapy 03/28/2003-05/25/2003    Prostate  .  Glaucoma   . Hx of radiation therapy 01/15/14-109/15    gastric cancer   Past Surgical History  Procedure Laterality Date  . Tumor excision  10 years ago    "off my spinal cord"  . Hemorrhoid surgery  09/2000    [name] 09/30/2010  . Cardiac catheterization      [name] 09/30/2010  . Esophagogastroduodenoscopy Left 11/24/2013    Procedure: ESOPHAGOGASTRODUODENOSCOPY (EGD);  Surgeon: Juanita Craver, MD;  Location: [facility] ENDOSCOPY;  Service: Endoscopy;  Laterality: Left;  . Esophagogastroduodenoscopy N/A 03/15/2015    Procedure: ESOPHAGOGASTRODUODENOSCOPY (EGD);  Surgeon: [name], MD;  Location: [facility] ENDOSCOPY;  Service: Endoscopy;  Laterality: N/A;    SOCIAL HISTORY:  reports that he has quit smoking. His smoking use included Cigarettes. He has a 5 pack-year smoking history. He has never used smokeless tobacco. He reports that he does not drink alcohol or use illicit drugs. Lives: Alone in an apartment                     Assistive devices:   Cane needed for ambulation.   No Known Allergies  Family History  Problem Relation Age of Onset  . Diabetes Mellitus II Mother   . Other Father     Prior to Admission medications   Medication Sig Start Date End Date Taking? Authorizing Provider  albuterol (  PROVENTIL HFA;VENTOLIN HFA) 108 (90 BASE) MCG/ACT inhaler Inhale 2 puffs into the lungs every 6 (six) hours as needed for wheezing.    Historical Provider, MD  albuterol (PROVENTIL) (2.5 MG/3ML) 0.083% nebulizer solution Take 2.5 mg by nebulization every 6 (six) hours as needed for wheezing.    Historical Provider, MD  bicalutamide (CASODEX) 50 MG tablet Take 50 mg by mouth daily. 11/16/13   Historical Provider, MD  esomeprazole (NEXIUM) 40 MG packet Take 40 mg by mouth daily before breakfast. 11/25/13   [name], MD  ferrous sulfate 325 (65 FE) MG tablet Take 325 mg by mouth daily with breakfast.    Historical Provider, MD  Fluticasone-Salmeterol (ADVAIR) 250-50 MCG/DOSE AEPB Inhale 1 puff into  the lungs every 12 (twelve) hours.    Historical Provider, MD  metFORMIN (GLUCOPHAGE) 500 MG tablet Take 1,000 mg by mouth 2 (two) times daily with a meal.    Historical Provider, MD  rosuvastatin (CRESTOR) 10 MG tablet Take 10 mg by mouth daily.    Historical Provider, MD  sitaGLIPtin (JANUVIA) 50 MG tablet Take 50 mg by mouth daily.    Historical Provider, MD  tamsulosin (FLOMAX) 0.4 MG CAPS Take 0.4 mg by mouth daily after breakfast.    Historical Provider, MD   PHYSICAL EXAM: Filed Vitals:   07/04/15 1251 07/04/15 1254 07/04/15 1300 07/04/15 1400  BP:  116/71 121/71 118/66  Pulse:   95 94  Temp:  97.6 F (36.4 C)    TempSrc:  Oral    Resp:  17 21 22   Height: 5\' 10"  (1.778 m)     Weight: 79.379 kg (175 lb)     SpO2:  100% 100% 100%    Wt Readings from Last 3 Encounters:  07/04/15 79.379 kg (175 lb)  09/18/14 79.833 kg (176 lb)  05/29/14 82.192 kg (181 lb 3.2 oz)    General:  Pleasant black male. Appears calm and comfortable Eyes: PER, normal lids, irises & conjunctiva ENT: grossly normal hearing, lips & tongue Neck: no LAD, no masses Cardiovascular: RRR, no murmurs. 2 plus bilateral lower extremity edema .  Respiratory: Respirations even and unlabored. Normal respiratory effort. Lungs CTA bilaterally, no wheezes / rales .   Abdomen: soft, non-distended, non-tender, active bowel sounds. No obvious masses.  Skin: no rash seen on limited exam Musculoskeletal: grossly normal tone BUE/BLE Psychiatric: grossly normal mood and affect, speech fluent and appropriate Neurologic: grossly non-focal.         LABS ON ADMISSION:    Basic Metabolic Panel:  Recent Labs Lab 07/04/15 1304  NA 140  K 3.8  CL 106  CO2 22  GLUCOSE 105*  BUN 10  CREATININE 0.54*  CALCIUM 8.2*    CBC:  Recent Labs Lab 07/04/15 1304  WBC 4.9  NEUTROABS PENDING  HGB 6.1*  HCT 20.5*  MCV 86.9  PLT PENDING    BNP (last 3 results)  Recent Labs  03/13/15 1727  BNP 92.4    CREATININE:  0.54 mg/dL ABNORMAL (07/04/15 1304) Estimated creatinine clearance - 72.2 mL/min   ASSESSMENT / PLAN   GI bleed, recurrent. Hemodynamically stable. Bleeding seems to be more of a subacute problem with black stools over the last month. Patient tells me he doesn't take iron but on his home med list?? Hemodynamically stable . He had APC of a bleeding gastric AVM in October 2016.  Rule out recurrent AVM bleeding, especially given history of radiation.  -Admit to medical bed -Protonix IV BID -Monitor  CBC -2 units of packed red blood cells already ordered by [facility] consult by EDP. Keep patient nothing by mouth until seen by GI. IVF at 75 ml /hr x 24 hours.   Severe anemia, acute on chronic and symptomatic. Hgb 6.1 -2 units of packed red blood cells ordered by EDP  History of gastric cancer diagnosed in 2015 (poorly differentiated adeno with signet ring cells), s/p radiation. No remaining gastric cancer on EGD Oct 2016 done for evaluation of GI bleeding. .   Diabetes mellitus, type 2 . Controlled.  -Hold home oral diabetic agents -Monitor CBGs -Sliding-scale insulin -hgb A1c    COPD with emphysema. Stable.  -Continue home inhalers  Hyperlipidemia.  -Continue home Crestor  Prostate cancer, advanced. Takes Casodex   CONSULTANTS:    Gastroenterology -Dr. Benson [place]  Code Status: full code DVT Prophylaxis: scds Family Communication:  Patient alert, oriented and understands plan of care.  Disposition Plan: Discharge to home in 24-48 hours   Time spent: 60 minutes Tye Savoy  NP [facility] Pager [phone]

## 2015-07-04 NOTE — ED Provider Notes (Signed)
CSN: [id number]     Arrival date & time 07/04/15  1246 History   First MD Initiated Contact with Patient 07/04/15 1252     Chief Complaint  Patient presents with  . Anemia   HPI  Melvin Gardner is an 82 year old male with PMHx of stomach cancer, GI bleed, anemia, DM and COPD presenting with anemia. Patient was at a yearly check up appointment at his PCP and was transferred to the emergency department with concerns of significant anemia. Patient denies all symptoms at this time. He states he feels at his baseline. He states the M.D. office and did not draw blood but believes that he was anemic so sent him here. He does have a history of GI bleed requiring blood transfusions with a hemoglobin of 4. He is not on blood thinners. He denies dizziness, lightheadedness, fatigue, weakness, shortness of breath, chest pain or syncopal events. He has no complaints.  Chart review: Patient recently hospitalized in October 2016 for GI bleed requiring blood transfusion. Antral AVM found on EGD  Past Medical History  Diagnosis Date  . Asthma   . High cholesterol   . Type II diabetes mellitus ([place])   . Anemia   . Arthritis     "left knee" (11/23/2013)  . COPD (chronic obstructive pulmonary disease) ([place])     [name] 09/17/2010  . Prostate cancer ([place]) 06/03/2001    bilat involvement,adenocarcinoma,gleason 6(3+3) PSA=37.6  . Stomach cancer ([place]) 11/24/13    adenocarcinoma   . History of blood transfusion 11/23/2013    related to lower GI bleeding  . History of radiation therapy 03/28/2003-05/25/2003    Prostate  . Glaucoma   . Hx of radiation therapy 01/15/14-109/15    gastric cancer   Past Surgical History  Procedure Laterality Date  . Tumor excision  10 years ago    "off my spinal cord"  . Hemorrhoid surgery  09/2000    [name] 09/30/2010  . Cardiac catheterization      [name] 09/30/2010  . Esophagogastroduodenoscopy Left 11/24/2013    Procedure: ESOPHAGOGASTRODUODENOSCOPY (EGD);  Surgeon: Juanita Craver, MD;   Location: [facility] ENDOSCOPY;  Service: Endoscopy;  Laterality: Left;  . Esophagogastroduodenoscopy N/A 03/15/2015    Procedure: ESOPHAGOGASTRODUODENOSCOPY (EGD);  Surgeon: [name], MD;  Location: [facility] ENDOSCOPY;  Service: Endoscopy;  Laterality: N/A;   Family History  Problem Relation Age of Onset  . Diabetes Mellitus II Mother   . Other Father    Social History  Substance Use Topics  . Smoking status: Former Smoker -- 1.00 packs/day for 5 years    Types: Cigarettes  . Smokeless tobacco: Never Used     Comment: "quit smoking in the 1950's"  . Alcohol Use: No     Comment: 11/23/2013 "don't drink anything now; never was a drinker" quit drugs years ago    Review of Systems  All other systems reviewed and are negative.     Allergies  Review of patient's allergies indicates no known allergies.  Home Medications   Prior to Admission medications   Medication Sig Start Date End Date Taking? Authorizing Provider  albuterol (PROVENTIL HFA;VENTOLIN HFA) 108 (90 BASE) MCG/ACT inhaler Inhale 2 puffs into the lungs every 6 (six) hours as needed for wheezing.    Historical Provider, MD  albuterol (PROVENTIL) (2.5 MG/3ML) 0.083% nebulizer solution Take 2.5 mg by nebulization every 6 (six) hours as needed for wheezing.    Historical Provider, MD  bicalutamide (CASODEX) 50 MG tablet Take 50 mg by mouth daily.  11/16/13   Historical Provider, MD  esomeprazole (NEXIUM) 40 MG packet Take 40 mg by mouth daily before breakfast. 11/25/13   [name], MD  ferrous sulfate 325 (65 FE) MG tablet Take 325 mg by mouth daily with breakfast.    Historical Provider, MD  Fluticasone-Salmeterol (ADVAIR) 250-50 MCG/DOSE AEPB Inhale 1 puff into the lungs every 12 (twelve) hours.    Historical Provider, MD  metFORMIN (GLUCOPHAGE) 500 MG tablet Take 1,000 mg by mouth 2 (two) times daily with a meal.    Historical Provider, MD  rosuvastatin (CRESTOR) 10 MG tablet Take 10 mg by mouth daily.    Historical Provider, MD   sitaGLIPtin (JANUVIA) 50 MG tablet Take 50 mg by mouth daily.    Historical Provider, MD  tamsulosin (FLOMAX) 0.4 MG CAPS Take 0.4 mg by mouth daily after breakfast.    Historical Provider, MD   BP 145/86 mmHg  Pulse 115  Temp(Src) 97.6 F (36.4 C) (Oral)  Resp 22  Ht 5\' 10"  (1.778 m)  Wt 79.379 kg  BMI 25.11 kg/m2  SpO2 100% Physical Exam  Constitutional: He appears well-developed and well-nourished. No distress.  HENT:  Head: Normocephalic and atraumatic.  Eyes: EOM are normal. Pupils are equal, round, and reactive to light. Right eye exhibits no discharge. Left eye exhibits no discharge. No scleral icterus.  Pale conjunctiva bilaterally  Neck: Normal range of motion.  Cardiovascular: Normal rate, regular rhythm and normal heart sounds.   Pulmonary/Chest: Effort normal and breath sounds normal. No respiratory distress.  Abdominal: Soft. There is no tenderness. There is no rebound and no guarding.  Genitourinary: Guaiac positive stool.  No tenderness on rectal exam. No frank blood. Dark stool noted with positive Hemoccult  Musculoskeletal: Normal range of motion.  Neurological: He is alert. Coordination normal.  Skin: Skin is warm and dry.  Psychiatric: He has a normal mood and affect. His behavior is normal.  Nursing note and vitals reviewed.   ED Course  Procedures (including critical care time) Labs Review Labs Reviewed  CBC WITH DIFFERENTIAL/PLATELET - Abnormal; Notable for the following:    RBC 2.36 (*)    Hemoglobin 6.1 (*)    HCT 20.5 (*)    RDW 21.8 (*)    Platelets 107 (*)    Lymphs Abs 0.3 (*)    All other components within normal limits  BASIC METABOLIC PANEL - Abnormal; Notable for the following:    Glucose, Bld 105 (*)    Creatinine, Ser 0.54 (*)    Calcium 8.2 (*)    All other components within normal limits  POC OCCULT BLOOD, ED - Abnormal; Notable for the following:    Fecal Occult Bld POSITIVE (*)    All other components within normal limits   APTT  PROTIME-INR  PREPARE RBC (CROSSMATCH)  TYPE AND SCREEN    Imaging Review No results found. I have personally reviewed and evaluated these images and lab results as part of my medical decision-making.   EKG Interpretation None      MDM   Final diagnoses:  Severe anemia   82 year old male presenting from PCPs office significantly low hemoglobin. Patient reports 1 month of dark tarry stools. Otherwise asymptomatic. History of GI bleed requiring transfusion. Hemodynamically stable in no acute distress. Pale conjunctiva. Abdomen is soft, nontender. Her stool noted on rectal exam with Hemoccult. Hemoglobin 6.1. Consulted gastroenterology he will follow patient during admission. Consulted hospitalist who will admit. Will transfuse 2 units packed red blood cells in  emergency department.    Josephina Gip, PA-C 07/04/15 1557  Carmin Muskrat, MD 07/05/15 [phone]

## 2015-07-04 NOTE — Consult Note (Signed)
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  Over the past week he reports having melena, but no hematochezia or hematemesis.  On 03/15/2015 the patient presented with melena and an EGD was performed with findings of a bleeding AVM at the site of radiation treatment.  There was no evidence of a residual cancer at that at time and he responded to [facility].  His D/C HGB on 03/16/2015 was at 7.8 g/dL.  He is asymptomatic, i.e., no complaints of chest pain or SOB.  During the October admission his HGB was at 4 g/dL upon presentation.  Past Medical History  Diagnosis Date  . Asthma   . High cholesterol   . Type II diabetes mellitus ([place])   . Anemia   . Arthritis     "left knee" (11/23/2013)  . COPD (chronic obstructive pulmonary disease) ([place])     [name] 09/17/2010  . Prostate cancer ([place]) 06/03/2001    bilat involvement,adenocarcinoma,gleason 6(3+3) PSA=37.6  . Stomach cancer ([place]) 11/24/13    adenocarcinoma   . History of blood transfusion 11/23/2013    related to lower GI bleeding  . History of radiation therapy 03/28/2003-05/25/2003    Prostate  . Glaucoma   . Hx of radiation therapy 01/15/14-109/15    gastric cancer    Past Surgical History  Procedure Laterality Date  . Tumor excision  10 years ago    "off my spinal cord"  . Hemorrhoid surgery  09/2000    [name] 09/30/2010  . Cardiac catheterization      [name] 09/30/2010  . Esophagogastroduodenoscopy Left 11/24/2013    Procedure: ESOPHAGOGASTRODUODENOSCOPY (EGD);  Surgeon: Juanita Craver, MD;  Location: [facility] ENDOSCOPY;  Service: Endoscopy;  Laterality: Left;  . Esophagogastroduodenoscopy N/A 03/15/2015    Procedure: ESOPHAGOGASTRODUODENOSCOPY (EGD);  Surgeon: [name],  MD;  Location: [facility] ENDOSCOPY;  Service: Endoscopy;  Laterality: N/A;    Family History  Problem Relation Age of Onset  . Diabetes Mellitus II Mother   . Other Father     Social History:  reports that he has quit smoking. His smoking use included Cigarettes. He has a 5 pack-year smoking history. He has never used smokeless tobacco. He reports that he does not drink alcohol or use illicit drugs.  Allergies: No Known Allergies  Medications: Scheduled: Continuous:  Results for orders placed or performed during the hospital encounter of 07/04/15 (from the past 24 hour(s))  CBC with Differential     Status: Abnormal   Collection Time: 07/04/15  1:04 PM  Result Value Ref Range   WBC 4.9 4.0 - 10.5 K/uL   RBC 2.36 (L) 4.22 - 5.81 MIL/uL   Hemoglobin 6.1 (LL) 13.0 - 17.0 g/dL   HCT 20.5 (L) 39.0 - 52.0 %   MCV 86.9 78.0 - 100.0 fL   MCH 26.3 26.0 - 34.0 pg   MCHC 30.2 30.0 - 36.0 g/dL   RDW 21.8 (H) 11.5 - 15.5 %   Platelets 107 (L) 150 - 400 K/uL   Neutrophils Relative % 85 %   Lymphocytes Relative 7 %   Monocytes Relative 8 %   Eosinophils Relative 0 %   Basophils Relative 0 %   Neutro Abs 4.2 1.7 - 7.7 K/uL  Lymphs Abs 0.3 (L) 0.7 - 4.0 K/uL   Monocytes Absolute 0.4 0.1 - 1.0 K/uL   Eosinophils Absolute 0.0 0.0 - 0.7 K/uL   Basophils Absolute 0.0 0.0 - 0.1 K/uL   RBC Morphology ACANTHOCYTES   Basic metabolic panel     Status: Abnormal   Collection Time: 07/04/15  1:04 PM  Result Value Ref Range   Sodium 140 135 - 145 mmol/L   Potassium 3.8 3.5 - 5.1 mmol/L   Chloride 106 101 - 111 mmol/L   CO2 22 22 - 32 mmol/L   Glucose, Bld 105 (H) 65 - 99 mg/dL   BUN 10 6 - 20 mg/dL   Creatinine, Ser 0.54 (L) 0.61 - 1.24 mg/dL   Calcium 8.2 (L) 8.9 - 10.3 mg/dL   GFR calc non Af Amer >60 >60 mL/min   GFR calc Af Amer >60 >60 mL/min   Anion gap 12 5 - 15  POC occult blood, ED     Status: Abnormal   Collection Time: 07/04/15  2:11 PM  Result Value Ref Range   Fecal Occult Bld  POSITIVE (A) NEGATIVE  APTT     Status: Abnormal   Collection Time: 07/04/15  3:18 PM  Result Value Ref Range   aPTT 38 (H) 24 - 37 seconds  Protime-INR     Status: Abnormal   Collection Time: 07/04/15  3:18 PM  Result Value Ref Range   Prothrombin Time 17.6 (H) 11.6 - 15.2 seconds   INR 1.44 0.00 - 1.49  Prepare RBC     Status: None   Collection Time: 07/04/15  3:18 PM  Result Value Ref Range   Order Confirmation ORDER PROCESSED BY BLOOD BANK   Type and screen [place]     Status: None (Preliminary result)   Collection Time: 07/04/15  3:22 PM  Result Value Ref Range   ABO/RH(D) O POS    Antibody Screen NEG    Sample Expiration 07/07/2015    Unit Number [id number]    Blood Component Type RBC LR PHER1    Unit division 00    Status of Unit ISSUED    Transfusion Status OK TO TRANSFUSE    Crossmatch Result Compatible    Unit Number [id number]    Blood Component Type RED CELLS,LR    Unit division 00    Status of Unit ALLOCATED    Transfusion Status OK TO TRANSFUSE    Crossmatch Result Compatible      No results found.  ROS:  As stated above in the HPI otherwise negative.  Blood pressure 110/64, pulse 97, temperature 98 F (36.7 C), temperature source Oral, resp. rate 19, height 5\' 10"  (1.778 m), weight 79.379 kg (175 lb), SpO2 99 %.    PE: Gen: NAD, Alert and Oriented HEENT:  [place]/AT, EOMI Neck: Supple, no LAD Lungs: CTA Bilaterally CV: RRR without M/G/R ABM: Soft, NTND, +BS Ext: No C/C/E  Assessment/Plan: 1) History of bleeding gastric AVM. 2) History of adenocarcinoma of the gastric antrum. 3) Anemia. 4) Melena.   Given his current presentation and the prior evaluation, it is likely he is bleeding again from the same site.  A repeat EGD with APC will be pursued.  This will likely be a recurrent problem as radiation will cause neovascularization, i.e., AVMs, at the site of treatment.  Plan: 1) EGD tomorrow. 2) Follow HGB and transfuse as  necessary.  Neema Barreira D 07/04/2015, 6:31 PM

## 2015-07-04 NOTE — ED Notes (Signed)
Notified PA that pt's hgb 6.1

## 2015-07-05 ENCOUNTER — Encounter (HOSPITAL_COMMUNITY)
Admission: EM | Disposition: A | Discharge status: Home / Self Care | Payer: Self-pay | Source: Home / Self Care | Attending: Internal Medicine

## 2015-07-05 ENCOUNTER — Encounter (HOSPITAL_COMMUNITY): Payer: Self-pay | Admitting: *Deleted

## 2015-07-05 DIAGNOSIS — D649 Anemia, unspecified: Secondary | ICD-10-CM

## 2015-07-05 DIAGNOSIS — E118 Type 2 diabetes mellitus with unspecified complications: Secondary | ICD-10-CM

## 2015-07-05 DIAGNOSIS — K2901 Acute gastritis with bleeding: Secondary | ICD-10-CM

## 2015-07-05 HISTORY — PX: ESOPHAGOGASTRODUODENOSCOPY: SHX5428

## 2015-07-05 LAB — GLUCOSE, CAPILLARY
GLUCOSE-CAPILLARY: 128 mg/dL — AB (ref 65–99)
GLUCOSE-CAPILLARY: 117 mg/dL — AB (ref 65–99)
GLUCOSE-CAPILLARY: 109 mg/dL — AB (ref 65–99)
Glucose-Capillary: 135 mg/dL — ABNORMAL HIGH (ref 65–99)
Glucose-Capillary: 108 mg/dL — ABNORMAL HIGH (ref 65–99)
Glucose-Capillary: 148 mg/dL — ABNORMAL HIGH (ref 65–99)

## 2015-07-05 LAB — CBC
HEMATOCRIT: 23.2 % — AB (ref 39.0–52.0)
HEMATOCRIT: 23.4 % — AB (ref 39.0–52.0)
HEMATOCRIT: 22.6 % — AB (ref 39.0–52.0)
HEMOGLOBIN: 7.5 g/dL — AB (ref 13.0–17.0)
Hemoglobin: 7.3 g/dL — ABNORMAL LOW (ref 13.0–17.0)
Hemoglobin: 7.2 g/dL — ABNORMAL LOW (ref 13.0–17.0)
MCH: 26.4 pg (ref 26.0–34.0)
MCH: 27.1 pg (ref 26.0–34.0)
MCH: 27 pg (ref 26.0–34.0)
MCHC: 31.5 g/dL (ref 30.0–36.0)
MCHC: 32.1 g/dL (ref 30.0–36.0)
MCHC: 31.9 g/dL (ref 30.0–36.0)
MCV: 84.1 fL (ref 78.0–100.0)
MCV: 84.5 fL (ref 78.0–100.0)
MCV: 84.6 fL (ref 78.0–100.0)
PLATELETS: 83 10*3/uL — AB (ref 150–400)
Platelets: 79 10*3/uL — ABNORMAL LOW (ref 150–400)
Platelets: 75 10*3/uL — ABNORMAL LOW (ref 150–400)
RBC: 2.76 MIL/uL — AB (ref 4.22–5.81)
RBC: 2.77 MIL/uL — AB (ref 4.22–5.81)
RBC: 2.67 MIL/uL — AB (ref 4.22–5.81)
RDW: 19.3 % — AB (ref 11.5–15.5)
RDW: 19.8 % — ABNORMAL HIGH (ref 11.5–15.5)
RDW: 19.8 % — ABNORMAL HIGH (ref 11.5–15.5)
WBC: 5.1 10*3/uL (ref 4.0–10.5)
WBC: 4.9 10*3/uL (ref 4.0–10.5)
WBC: 4.6 10*3/uL (ref 4.0–10.5)

## 2015-07-05 SURGERY — EGD (ESOPHAGOGASTRODUODENOSCOPY)
Anesthesia: Moderate Sedation

## 2015-07-05 MED ORDER — SODIUM CHLORIDE 0.9 % IV SOLN: INTRAVENOUS | Status: DC

## 2015-07-05 MED ORDER — MIDAZOLAM HCL 5 MG/ML IJ SOLN
INTRAMUSCULAR | Status: AC
  Filled 2015-07-05: qty 1

## 2015-07-05 MED ORDER — FENTANYL CITRATE (PF) 100 MCG/2ML IJ SOLN
INTRAMUSCULAR | Status: AC
  Filled 2015-07-05: qty 2

## 2015-07-05 MED ORDER — MIDAZOLAM HCL 10 MG/2ML IJ SOLN
INTRAMUSCULAR | Status: DC | PRN
  Administered 2015-07-05: 2 mg via INTRAVENOUS

## 2015-07-05 MED ORDER — ALBUTEROL SULFATE (2.5 MG/3ML) 0.083% IN NEBU
INHALATION_SOLUTION | RESPIRATORY_TRACT | Status: AC
  Administered 2015-07-05: 2.5 mg via RESPIRATORY_TRACT
  Filled 2015-07-05: qty 3

## 2015-07-05 MED ORDER — ALBUTEROL SULFATE (2.5 MG/3ML) 0.083% IN NEBU
2.5000 mg | INHALATION_SOLUTION | RESPIRATORY_TRACT | Status: DC | PRN
  Administered 2015-07-05 – 2015-07-06 (×2): 2.5 mg via RESPIRATORY_TRACT
  Filled 2015-07-05: qty 3

## 2015-07-05 MED ORDER — FENTANYL CITRATE (PF) 100 MCG/2ML IJ SOLN
INTRAMUSCULAR | Status: DC | PRN
  Administered 2015-07-05: 25 ug via INTRAVENOUS

## 2015-07-05 NOTE — Care Management Note (Signed)
Case Management Note  Patient Details  Name: AZUL KLOPFENSTEIN MRN: [id number] Date of Birth: [birth year]  Subjective/Objective:                 Patient admitted from home with anemia. Patient has aide through [facility] 3 hours a day 7 days per week. Has cane and nebulizer declines further DME. Uses [facility] and friends for transportation. Denies any problems getting medications, covered with medicare and medicaid, has PCP.   Action/Plan:  Anticipate DC to home tomorrow, self care Expected Discharge Date:                  Expected Discharge Plan:  [place]  In-House Referral:     Discharge planning Services  CM Consult  Post Acute Care Choice:  Home Health, Resumption of Svcs/PTA Provider (has [facility]) Choice offered to:     DME Arranged:    DME Agency:     HH Arranged:    [place] Agency:     Status of Service:  In process, will continue to follow  Medicare Important Message Given:    Date Medicare IM Given:    Medicare IM give by:    Date Additional Medicare IM Given:    Additional Medicare Important Message give by:     If discussed at [place] of Stay Meetings, dates discussed:    Additional Comments:  Carles Collet, RN 07/05/2015, 11:40 AM

## 2015-07-05 NOTE — Progress Notes (Signed)
[facility] PROGRESS NOTE    Progress Note   Melvin Gardner [id number] DOB: 1930/01/07 DOA: 21-Jul-2015 PCP: [name]., MD   Brief Narrative:   Melvin Gardner is an 82 y.o. male with a history of gastric cancer with a negative EGD on 10 2016, comes it for acute GI bleed status post 2 units of packed red blood cells.  Assessment/Plan:   Severe anemia/  recurrent GI bleed He is on Protonix IV twice a day, status post 2 units of packed red blood cells. He is hemodynamically stable, now nothing by mouth for possible EGD to 17 2017. No melena or hematemesis in-house.  History of gastric cancer: Diagnosing 2015 poorly differentiated status post radiation, he had an EGD on October 2016 that showed no evidence of malignancy.  Diabetes mellitus, type 2 (HCC) Control sliding-scale insulin.  COPD with emphysema (HCC)  Continue inhalers.  Hyperlipidemia    DVT Prophylaxis - SCD's  Family Communication: none Disposition Plan: Home 1-2 days Code Status:     Code Status Orders        Start     Ordered   07-21-15 2037  Full code   Continuous     07/21/2015 2036    Code Status History    Date Active Date Inactive Code Status Order ID Comments User Context   03/13/2015  8:55 PM 03/16/2015  6:36 PM Full Code [id number]  Nita Sells, MD Inpatient   12/03/2014 10:03 AM 12/04/2014  4:19 PM Full Code [id number]  Melton Alar, PA-C Inpatient   11/23/2013  5:31 PM 11/25/2013  3:51 PM Full Code [id number]  Charlynne Cousins, MD Inpatient        IV Access:    Peripheral IV   Procedures and diagnostic studies:   Dg Chest Port 1 View  21-Jul-2015  CLINICAL DATA:  Dyspnea. EXAM: PORTABLE CHEST 1 VIEW COMPARISON:  March 13, 2015. FINDINGS: The heart size and mediastinal contours are within normal limits. Both lungs are clear. No pneumothorax or pleural effusion is noted. Elevated left hemidiaphragm is noted. The visualized skeletal structures are  unremarkable. IMPRESSION: No acute cardiopulmonary abnormality seen. Electronically Signed   By: Marijo Conception, M.D.   On: Jul 21, 2015 21:33     Medical Consultants:    None.  Anti-Infectives:   Anti-infectives    None      Subjective:    Melvin Gardner complaining that he is hungry.  Objective:    Filed Vitals:   07/05/15 0043 07/05/15 0102 07/05/15 0132 07/05/15 0553  BP: 117/75 95/46 125/73 109/57  Pulse: 99 94 108 94  Temp: 98 F (36.7 C) 97.5 F (36.4 C) 97.7 F (36.5 C) 98.3 F (36.8 C)  TempSrc: Oral Oral Oral Oral  Resp: 20 20 20 20   Height:      Weight:      SpO2: 99% 99%  100%    Intake/Output Summary (Last 24 hours) at 07/05/15 0826 Last data filed at 07/05/15 0555  Gross per 24 hour  Intake 940.82 ml  Output      0 ml  Net 940.82 ml   Filed Weights   2015/07/21 1251  Weight: 79.379 kg (175 lb)    Exam: Gen:  NAD Cardiovascular:  RRR. Chest and lungs:   CTAB Abdomen:  Abdomen soft, NT/ND, + BS Extremities:  No edema   Data Reviewed:    Labs: Basic Metabolic Panel:  Recent Labs Lab July 21, 2015 1304  NA 140  K 3.8  CL 106  CO2 22  GLUCOSE 105*  BUN 10  CREATININE 0.54*  CALCIUM 8.2*   GFR Estimated Creatinine Clearance: 70 mL/min (by C-G formula based on Cr of 0.54). Liver Function Tests: No results for input(s): AST, ALT, ALKPHOS, BILITOT, PROT, ALBUMIN in the last 168 hours. No results for input(s): LIPASE, AMYLASE in the last 168 hours. No results for input(s): AMMONIA in the last 168 hours. Coagulation profile  Recent Labs Lab 07/04/15 1518  INR 1.44    CBC:  Recent Labs Lab 07/04/15 1304 07/04/15 2214 07/05/15 0520  WBC 4.9 5.9 5.1  NEUTROABS 4.2  --   --   HGB 6.1* 7.5* 7.3*  HCT 20.5* 24.3* 23.2*  MCV 86.9 85.6 84.1  PLT 107* 89* 83*   Cardiac Enzymes: No results for input(s): CKTOTAL, CKMB, CKMBINDEX, TROPONINI in the last 168 hours. BNP (last 3 results) No results for input(s): PROBNP in the  last 8760 hours. CBG:  Recent Labs Lab 07/05/15 0130 07/05/15 0359 07/05/15 0754  GLUCAP 135* 128* 117*   D-Dimer: No results for input(s): DDIMER in the last 72 hours. Hgb A1c: No results for input(s): HGBA1C in the last 72 hours. Lipid Profile: No results for input(s): CHOL, HDL, LDLCALC, TRIG, CHOLHDL, LDLDIRECT in the last 72 hours. Thyroid function studies: No results for input(s): TSH, T4TOTAL, T3FREE, THYROIDAB in the last 72 hours.  Invalid input(s): FREET3 Anemia work up: No results for input(s): VITAMINB12, FOLATE, FERRITIN, TIBC, IRON, RETICCTPCT in the last 72 hours. Sepsis Labs:  Recent Labs Lab 07/04/15 1304 07/04/15 2214 07/05/15 0520  WBC 4.9 5.9 5.1   Microbiology No results found for this or any previous visit (from the past 240 hour(s)).   Medications:   . insulin aspart  0-9 Units Subcutaneous 6 times per day  . pantoprazole (PROTONIX) IV  40 mg Intravenous Q12H   Continuous Infusions: . sodium chloride 75 mL/hr at 07/05/15 0134    Time spent: 25 min   LOS: 1 day   Charlynne Cousins  [facility] Pager [phone]  *Please refer to [email or web address], password [email or web address] to get updated schedule on who will round on this patient, as hospitalists switch teams weekly. If 7PM-7AM, please contact night-coverage at [email or web address], password [email or web address] for any overnight needs.  07/05/2015, 8:26 AM

## 2015-07-05 NOTE — H&P (View-Only) (Signed)
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  Over the past week he reports having melena, but no hematochezia or hematemesis.  On 03/15/2015 the patient presented with melena and an EGD was performed with findings of a bleeding AVM at the site of radiation treatment.  There was no evidence of a residual cancer at that at time and he responded to [facility].  His D/C HGB on 03/16/2015 was at 7.8 g/dL.  He is asymptomatic, i.e., no complaints of chest pain or SOB.  During the October admission his HGB was at 4 g/dL upon presentation.  Past Medical History  Diagnosis Date  . Asthma   . High cholesterol   . Type II diabetes mellitus ([place])   . Anemia   . Arthritis     "left knee" (11/23/2013)  . COPD (chronic obstructive pulmonary disease) ([place])     Melvin Gardner 09/17/2010  . Prostate cancer ([place]) 06/03/2001    bilat involvement,adenocarcinoma,gleason 6(3+3) PSA=37.6  . Stomach cancer ([place]) 11/24/13    adenocarcinoma   . History of blood transfusion 11/23/2013    related to lower GI bleeding  . History of radiation therapy 03/28/2003-05/25/2003    Prostate  . Glaucoma   . Hx of radiation therapy 01/15/14-109/15    gastric cancer    Past Surgical History  Procedure Laterality Date  . Tumor excision  10 years ago    "off my spinal cord"  . Hemorrhoid surgery  09/2000    Melvin Gardner 09/30/2010  . Cardiac catheterization      Melvin Gardner 09/30/2010  . Esophagogastroduodenoscopy Left 11/24/2013    Procedure: ESOPHAGOGASTRODUODENOSCOPY (EGD);  Surgeon: Melvin Craver, MD;  Location: [facility] ENDOSCOPY;  Service: Endoscopy;  Laterality: Left;  . Esophagogastroduodenoscopy N/A 03/15/2015    Procedure: ESOPHAGOGASTRODUODENOSCOPY (EGD);  Surgeon: Melvin Ada,  MD;  Location: [facility] ENDOSCOPY;  Service: Endoscopy;  Laterality: N/A;    Family History  Problem Relation Age of Onset  . Diabetes Mellitus II Mother   . Other Father     Social History:  reports that he has quit smoking. His smoking use included Cigarettes. He has a 5 pack-year smoking history. He has never used smokeless tobacco. He reports that he does not drink alcohol or use illicit drugs.  Allergies: No Known Allergies  Medications: Scheduled: Continuous:  Results for orders placed or performed during the hospital encounter of 07/04/15 (from the past 24 hour(s))  CBC with Differential     Status: Abnormal   Collection Time: 07/04/15  1:04 PM  Result Value Ref Range   WBC 4.9 4.0 - 10.5 K/uL   RBC 2.36 (L) 4.22 - 5.81 MIL/uL   Hemoglobin 6.1 (LL) 13.0 - 17.0 g/dL   HCT 20.5 (L) 39.0 - 52.0 %   MCV 86.9 78.0 - 100.0 fL   MCH 26.3 26.0 - 34.0 pg   MCHC 30.2 30.0 - 36.0 g/dL   RDW 21.8 (H) 11.5 - 15.5 %   Platelets 107 (L) 150 - 400 K/uL   Neutrophils Relative % 85 %   Lymphocytes Relative 7 %   Monocytes Relative 8 %   Eosinophils Relative 0 %   Basophils Relative 0 %   Neutro Abs 4.2 1.7 - 7.7 K/uL  Lymphs Abs 0.3 (L) 0.7 - 4.0 K/uL   Monocytes Absolute 0.4 0.1 - 1.0 K/uL   Eosinophils Absolute 0.0 0.0 - 0.7 K/uL   Basophils Absolute 0.0 0.0 - 0.1 K/uL   RBC Morphology ACANTHOCYTES   Basic metabolic panel     Status: Abnormal   Collection Time: 07/04/15  1:04 PM  Result Value Ref Range   Sodium 140 135 - 145 mmol/L   Potassium 3.8 3.5 - 5.1 mmol/L   Chloride 106 101 - 111 mmol/L   CO2 22 22 - 32 mmol/L   Glucose, Bld 105 (H) 65 - 99 mg/dL   BUN 10 6 - 20 mg/dL   Creatinine, Ser 0.54 (L) 0.61 - 1.24 mg/dL   Calcium 8.2 (L) 8.9 - 10.3 mg/dL   GFR calc non Af Amer >60 >60 mL/min   GFR calc Af Amer >60 >60 mL/min   Anion gap 12 5 - 15  POC occult blood, ED     Status: Abnormal   Collection Time: 07/04/15  2:11 PM  Result Value Ref Range   Fecal Occult Bld  POSITIVE (A) NEGATIVE  APTT     Status: Abnormal   Collection Time: 07/04/15  3:18 PM  Result Value Ref Range   aPTT 38 (H) 24 - 37 seconds  Protime-INR     Status: Abnormal   Collection Time: 07/04/15  3:18 PM  Result Value Ref Range   Prothrombin Time 17.6 (H) 11.6 - 15.2 seconds   INR 1.44 0.00 - 1.49  Prepare RBC     Status: None   Collection Time: 07/04/15  3:18 PM  Result Value Ref Range   Order Confirmation ORDER PROCESSED BY BLOOD BANK   Type and screen [place]     Status: None (Preliminary result)   Collection Time: 07/04/15  3:22 PM  Result Value Ref Range   ABO/RH(D) O POS    Antibody Screen NEG    Sample Expiration 07/07/2015    Unit Number [id number]    Blood Component Type RBC LR PHER1    Unit division 00    Status of Unit ISSUED    Transfusion Status OK TO TRANSFUSE    Crossmatch Result Compatible    Unit Number [id number]    Blood Component Type RED CELLS,LR    Unit division 00    Status of Unit ALLOCATED    Transfusion Status OK TO TRANSFUSE    Crossmatch Result Compatible      No results found.  ROS:  As stated above in the HPI otherwise negative.  Blood pressure 110/64, pulse 97, temperature 98 F (36.7 C), temperature source Oral, resp. rate 19, height 5\' 10"  (1.778 m), weight 79.379 kg (175 lb), SpO2 99 %.    PE: Gen: NAD, Alert and Oriented HEENT:  Melvin Gardner/AT, EOMI Neck: Supple, no LAD Lungs: CTA Bilaterally CV: RRR without M/G/R ABM: Soft, NTND, +BS Ext: No C/C/E  Assessment/Plan: 1) History of bleeding gastric AVM. 2) History of adenocarcinoma of the gastric antrum. 3) Anemia. 4) Melena.   Given his current presentation and the prior evaluation, it is likely he is bleeding again from the same site.  A repeat EGD with APC will be pursued.  This will likely be a recurrent problem as radiation will cause neovascularization, i.e., AVMs, at the site of treatment.  Plan: 1) EGD tomorrow. 2) Follow HGB and transfuse as  necessary.  Sarann Tregre D 07/04/2015, 6:31 PM

## 2015-07-05 NOTE — Op Note (Signed)
[facility] [place], [address]   ENDOSCOPY PROCEDURE REPORT  PATIENT: Melvin Gardner, Melvin Gardner  MR#: [id number] BIRTHDATE: January 12, 1930 , 85  yrs. old GENDER: male ENDOSCOPIST:Ryver Zadrozny Benson [place], MD REFERRED BY: PROCEDURE DATE:  2015-07-28 PROCEDURE:   EGD w/ control of bleeding ASA CLASS:    Class III INDICATIONS:  Upper GI bleed MEDICATION: Versed 2 mg IV and Fentanyl 25 mcg IV TOPICAL ANESTHETIC:   none  DESCRIPTION OF PROCEDURE:   After the risks and benefits of the procedure were explained, informed consent was obtained.  The Pentax Gastroscope [id number]  endoscope was introduced through the mouth  and advanced to the second portion of the duodenum .  The instrument was slowly withdrawn as the mucosa was fully examined. Estimated blood loss is zero unless otherwise noted in this procedure report.   FINDINGS: The esophagus was normal.  In the gastric lumen there was evidence of some fresh blood.  Further examination revealed oozing from the gastric cancer site.  Compared to the prior EGD on 02/2015, the area was nodular and diffusely oozing.  It appears that this his a recurrence of his gastric cancer.  A couple of colon biopsies were obtained to confirm suspicions for a recurrence. The area was then treated with APC, but this is only a temporary measure.  The duodenum was normal.          The scope was then withdrawn from the patient and the procedure completed.  COMPLICATIONS: There were no immediate complications.  ENDOSCOPIC IMPRESSION: 1) Probable recurrence of the gastric cancer.  RECOMMENDATIONS: 1) Follow up biopsies. 2) Follow HGB and transfuse as necessary. 3) Follow up with Oncology as soon as possible.  _______________________________ eSigned:  [name], MD Jul 28, 2015 4:25 PM     cc:  CPT CODES: ICD CODES:  The ICD and CPT codes recommended by this software are interpretations from the data that the clinical staff  has captured with the software.  The verification of the translation of this report to the ICD and CPT codes and modifiers is the sole responsibility of the health care institution and practicing physician where this report was generated.  [employer]. will not be held responsible for the validity of the ICD and CPT codes included on this report.  AMA assumes no liability for data contained or not contained herein. CPT is a Designer, television/film set of the [employer].

## 2015-07-05 NOTE — Interval H&P Note (Signed)
History and Physical Interval Note:  07/05/2015 4:01 PM  Melvin Gardner  has presented today for surgery, with the diagnosis of GI bleed  The various methods of treatment have been discussed with the patient and family. After consideration of risks, benefits and other options for treatment, the patient has consented to  Procedure(s): ESOPHAGOGASTRODUODENOSCOPY (EGD) (N/A) as a surgical intervention .  The patient's history has been reviewed, patient examined, no change in status, stable for surgery.  I have reviewed the patient's chart and labs.  Questions were answered to the patient's satisfaction.     [name] D

## 2015-07-06 DIAGNOSIS — D6489 Other specified anemias: Secondary | ICD-10-CM

## 2015-07-06 DIAGNOSIS — D62 Acute posthemorrhagic anemia: Secondary | ICD-10-CM

## 2015-07-06 DIAGNOSIS — C162 Malignant neoplasm of body of stomach: Secondary | ICD-10-CM

## 2015-07-06 LAB — GLUCOSE, CAPILLARY
GLUCOSE-CAPILLARY: 102 mg/dL — AB (ref 65–99)
GLUCOSE-CAPILLARY: 117 mg/dL — AB (ref 65–99)
Glucose-Capillary: 112 mg/dL — ABNORMAL HIGH (ref 65–99)
Glucose-Capillary: 122 mg/dL — ABNORMAL HIGH (ref 65–99)
Glucose-Capillary: 153 mg/dL — ABNORMAL HIGH (ref 65–99)
Glucose-Capillary: 155 mg/dL — ABNORMAL HIGH (ref 65–99)

## 2015-07-06 LAB — CBC
HEMATOCRIT: 24.4 % — AB (ref 39.0–52.0)
Hemoglobin: 7.6 g/dL — ABNORMAL LOW (ref 13.0–17.0)
MCH: 26.6 pg (ref 26.0–34.0)
MCHC: 31.1 g/dL (ref 30.0–36.0)
MCV: 85.3 fL (ref 78.0–100.0)
PLATELETS: 67 10*3/uL — AB (ref 150–400)
RBC: 2.86 MIL/uL — ABNORMAL LOW (ref 4.22–5.81)
RDW: 18.7 % — AB (ref 11.5–15.5)
WBC: 4.4 10*3/uL (ref 4.0–10.5)

## 2015-07-06 LAB — PREPARE RBC (CROSSMATCH)

## 2015-07-06 MED ORDER — SODIUM CHLORIDE 0.9 % IV SOLN
Freq: Once | INTRAVENOUS | Status: AC
Start: 2015-07-06 — End: 2015-07-06
  Administered 2015-07-06: 13:00:00 via INTRAVENOUS

## 2015-07-06 MED ORDER — PANTOPRAZOLE SODIUM 40 MG PO TBEC
40.0000 mg | DELAYED_RELEASE_TABLET | Freq: Two times a day (BID) | ORAL | Status: DC
  Administered 2015-07-06 – 2015-07-07 (×2): 40 mg via ORAL
  Filled 2015-07-06 (×2): qty 1

## 2015-07-06 NOTE — Progress Notes (Addendum)
[facility] PROGRESS NOTE    Progress Note   Melvin Gardner [id number] DOB: December 20, 1929 DOA: 2015-07-13 PCP: [name]., MD   Brief Narrative:   Melvin Gardner is an 82 y.o. male with a history of gastric cancer with a negative EGD on 10 2016, comes it for acute GI bleed status post 2 units of packed red blood cells.  Assessment/Plan:   Acute blood loss anemia/  recurrent GI bleed: Continue Protonix  twice a day, status post 2 units of packed red blood cells. EGD was done that showed probable recurrence of his gastric cancer. Hemoglobin now 7.1 transfuse 1 unit of packed red blood cells check a CBC posttransfusion.  History of gastric cancer: Diagnosing 2015 poorly differentiated status post radiation, he had an EGD on October 2016 that showed no evidence of malignancy.  Diabetes mellitus, type 2 ([place]): Control sliding-scale insulin.  COPD with emphysema ([place]):  Continue inhalers.  Hyperlipidemia:    DVT Prophylaxis - SCD's  Family Communication: none Disposition Plan: Home 1 days Code Status:     Code Status Orders        Start     Ordered   July 13, 2015 2037  Full code   Continuous     2015/07/13 2036    Code Status History    Date Active Date Inactive Code Status Order ID Comments User Context   03/13/2015  8:55 PM 03/16/2015  6:36 PM Full Code [id number]  Nita Sells, MD Inpatient   12/03/2014 10:03 AM 12/04/2014  4:19 PM Full Code [id number]  Melton Alar, PA-C Inpatient   11/23/2013  5:31 PM 11/25/2013  3:51 PM Full Code [id number]  Charlynne Cousins, MD Inpatient      IV Access:    Peripheral IV   Procedures and diagnostic studies:   Dg Chest Port 1 View  13-Jul-2015  CLINICAL DATA:  Dyspnea. EXAM: PORTABLE CHEST 1 VIEW COMPARISON:  March 13, 2015. FINDINGS: The heart size and mediastinal contours are within normal limits. Both lungs are clear. No pneumothorax or pleural effusion is noted. Elevated left hemidiaphragm is noted.  The visualized skeletal structures are unremarkable. IMPRESSION: No acute cardiopulmonary abnormality seen. Electronically Signed   By: Marijo Conception, M.D.   On: 2015-07-13 21:33     Medical Consultants:    None.  Anti-Infectives:   Anti-infectives    None      Subjective:    Melvin Gardner complaining that he is hungry.  Objective:    Filed Vitals:   07/05/15 1625 07/05/15 1630 07/05/15 2051 07/06/15 0406  BP: 109/53 113/60 91/51 115/59  Pulse: 97 92 101 90  Temp:   98.3 F (36.8 C)   TempSrc:   Oral   Resp: 21 21 20    Height:      Weight:   69.5 kg (153 lb 3.5 oz)   SpO2: 96% 96% 100%     Intake/Output Summary (Last 24 hours) at 07/06/15 0957 Last data filed at 07/05/15 1632  Gross per 24 hour  Intake    150 ml  Output      0 ml  Net    150 ml   Filed Weights   07/13/15 1251 07/05/15 2051  Weight: 79.379 kg (175 lb) 69.5 kg (153 lb 3.5 oz)    Exam: Gen:  NAD Cardiovascular:  RRR. Chest and lungs:   CTAB Abdomen:  Abdomen soft, NT/ND, + BS Extremities:  No edema   Data Reviewed:    Labs: Basic  Metabolic Panel:  Recent Labs Lab 07/04/15 1304  NA 140  K 3.8  CL 106  CO2 22  GLUCOSE 105*  BUN 10  CREATININE 0.54*  CALCIUM 8.2*   GFR Estimated Creatinine Clearance: 68.8 mL/min (by C-G formula based on Cr of 0.54). Liver Function Tests: No results for input(s): AST, ALT, ALKPHOS, BILITOT, PROT, ALBUMIN in the last 168 hours. No results for input(s): LIPASE, AMYLASE in the last 168 hours. No results for input(s): AMMONIA in the last 168 hours. Coagulation profile  Recent Labs Lab 07/04/15 1518  INR 1.44    CBC:  Recent Labs Lab 07/04/15 1304 07/04/15 2214 07/05/15 0520 07/05/15 1118 07/05/15 2042  WBC 4.9 5.9 5.1 4.9 4.6  NEUTROABS 4.2  --   --   --   --   HGB 6.1* 7.5* 7.3* 7.5* 7.2*  HCT 20.5* 24.3* 23.2* 23.4* 22.6*  MCV 86.9 85.6 84.1 84.5 84.6  PLT 107* 89* 83* 79* 75*   Cardiac Enzymes: No results for  input(s): CKTOTAL, CKMB, CKMBINDEX, TROPONINI in the last 168 hours. BNP (last 3 results) No results for input(s): PROBNP in the last 8760 hours. CBG:  Recent Labs Lab 07/05/15 1744 07/05/15 2052 07/06/15 0002 07/06/15 0409 07/06/15 0807  GLUCAP 108* 148* 112* 102* 117*   D-Dimer: No results for input(s): DDIMER in the last 72 hours. Hgb A1c: No results for input(s): HGBA1C in the last 72 hours. Lipid Profile: No results for input(s): CHOL, HDL, LDLCALC, TRIG, CHOLHDL, LDLDIRECT in the last 72 hours. Thyroid function studies: No results for input(s): TSH, T4TOTAL, T3FREE, THYROIDAB in the last 72 hours.  Invalid input(s): FREET3 Anemia work up: No results for input(s): VITAMINB12, FOLATE, FERRITIN, TIBC, IRON, RETICCTPCT in the last 72 hours. Sepsis Labs:  Recent Labs Lab 07/04/15 2214 07/05/15 0520 07/05/15 1118 07/05/15 2042  WBC 5.9 5.1 4.9 4.6   Microbiology No results found for this or any previous visit (from the past 240 hour(s)).   Medications:   . sodium chloride   Intravenous Once  . insulin aspart  0-9 Units Subcutaneous 6 times per day  . pantoprazole (PROTONIX) IV  40 mg Intravenous Q12H   Continuous Infusions:    Time spent: 15 min   LOS: 2 days   Charlynne Cousins  [facility] Pager [phone]  *Please refer to [email or web address], password [email or web address] to get updated schedule on who will round on this patient, as hospitalists switch teams weekly. If 7PM-7AM, please contact night-coverage at [email or web address], password [email or web address] for any overnight needs.  07/06/2015, 9:57 AM

## 2015-07-07 DIAGNOSIS — K264 Chronic or unspecified duodenal ulcer with hemorrhage: Secondary | ICD-10-CM

## 2015-07-07 LAB — TYPE AND SCREEN
ABO/RH(D): O POS
Antibody Screen: NEGATIVE
UNIT DIVISION: 0
Unit division: 0
Unit division: 0

## 2015-07-07 LAB — GLUCOSE, CAPILLARY
GLUCOSE-CAPILLARY: 112 mg/dL — AB (ref 65–99)
GLUCOSE-CAPILLARY: 130 mg/dL — AB (ref 65–99)
GLUCOSE-CAPILLARY: 133 mg/dL — AB (ref 65–99)
Glucose-Capillary: 150 mg/dL — ABNORMAL HIGH (ref 65–99)

## 2015-07-07 MED ORDER — PANTOPRAZOLE SODIUM 40 MG PO TBEC: 40.0000 mg | DELAYED_RELEASE_TABLET | Freq: Two times a day (BID) | ORAL | Status: AC

## 2015-07-07 NOTE — Progress Notes (Signed)
Nsg Discharge Note  Admit Date:  07/04/2015 Discharge date: 07/07/2015   Melvin Gardner to be D/C'd home per MD order.  AVS completed.  Copy for chart, and copy for patient signed, and dated. Patient/caregiver able to verbalize understanding. RN called Case Management regarding home health services resumption. CM stated that pt refused HH and that RN is okay to discharge pt home. Pt has [facility] services where someone comes to check up on pt everyday.   Discharge Medication:   Medication List    TAKE these medications        albuterol 108 (90 Base) MCG/ACT inhaler  Commonly known as:  PROVENTIL HFA;VENTOLIN HFA  Inhale 2 puffs into the lungs every 6 (six) hours as needed for wheezing.     albuterol (2.5 MG/3ML) 0.083% nebulizer solution  Commonly known as:  PROVENTIL  Take 2.5 mg by nebulization every 6 (six) hours as needed for wheezing.     bicalutamide 50 MG tablet  Commonly known as:  CASODEX  Take 50 mg by mouth daily.     esomeprazole 40 MG packet  Commonly known as:  NEXIUM  Take 40 mg by mouth daily before breakfast.     ferrous sulfate 325 (65 FE) MG tablet  Take 325 mg by mouth daily with breakfast.     Fluticasone-Salmeterol 250-50 MCG/DOSE Aepb  Commonly known as:  ADVAIR  Inhale 1 puff into the lungs every 12 (twelve) hours.     metFORMIN 500 MG tablet  Commonly known as:  GLUCOPHAGE  Take 1,000 mg by mouth 2 (two) times daily with a meal.     pantoprazole 40 MG tablet  Commonly known as:  PROTONIX  Take 1 tablet (40 mg total) by mouth 2 (two) times daily.     rosuvastatin 10 MG tablet  Commonly known as:  CRESTOR  Take 10 mg by mouth daily.     sitaGLIPtin 50 MG tablet  Commonly known as:  JANUVIA  Take 50 mg by mouth daily.     tamsulosin 0.4 MG Caps capsule  Commonly known as:  FLOMAX  Take 0.4 mg by mouth daily after breakfast.        Discharge Assessment: Filed Vitals:   07/06/15 2122 07/07/15 0615  BP: 101/56 100/59  Pulse: 94 87  Temp:  98.3 F (36.8 C) 98.3 F (36.8 C)  Resp: 16 16   Skin clean, dry and intact without evidence of skin break down, no evidence of skin tears noted. IV catheters discontinued with catheter tips intact. Site without signs and symptoms of complications - no redness or edema noted at insertion site, patient denies c/o pain - only slight tenderness at site.  Dressing with slight pressure applied.  D/c Instructions-Education: Discharge instructions given to patient/family with verbalized understanding. D/c education completed with patient/family including follow up instructions, medication list, d/c activities limitations if indicated, with other d/c instructions as indicated by MD - patient able to verbalize understanding, all questions fully answered. Patient instructed to return to ED, call 911, or call MD for any changes in condition.  Patient in street clothes with help from CNA Tech. Pt in room waiting for his ride to arrive to hospital at this time. Pt sitting up on side of bed, bed low and locked. Bed alarm on, call bell within reach. Pt informed to call RN once pt's ride is here.    Dorita Fray, RN 07/07/2015 1:37 PM

## 2015-07-07 NOTE — Discharge Summary (Signed)
Physician Discharge Summary  Melvin Gardner [id number] DOB: December 03, 1929 DOA: 07/04/2015  PCP: [name]., MD  Admit date: 07/04/2015 Discharge date: 07/07/2015  Time spent: 36minutes  Recommendations for Outpatient Follow-up:  1. Follow-up with oncology next week. Check a CBC and transfuse as needed. 2. Also talked to him about restarting chemotherapy and/or radiation.   Discharge Diagnoses:  Active Problems:   Diabetes mellitus, type 2 (HCC)   Anemia   COPD with emphysema (HCC)   Severe anemia   GI bleed   Hyperlipidemia   Acute blood loss anemia   Discharge Condition: guarded  Diet recommendation: regular  Filed Weights   07/04/15 1251 07/05/15 2051  Weight: 79.379 kg (175 lb) 69.5 kg (153 lb 3.5 oz)    History of present illness:  82 y.o. male with diabetes and COPD, CAP prostate cancer as well as gastric cancer s/p radiation.[name] He was admitted late October with symptomatic anemia, hemoglobin was 4 at the time. He had an upper endoscopy with APC of a bleeding AVM. No evidence of gastric cancer found. Patient received 4 units of blood. Hgb around discharge was 7.8.   Hospital Course:  Acute blood loss anemia/ recurrent GI bleed: He was started on IV Protonix GI was consulted EGD was done that showed probable recurrence of his gastric cancer. Continue Protonix twice a day, he was given 2 doses Packer blood cells his hemoglobin ranged stable at 7.6. Follow-up with oncology as an outpatient.  History of gastric cancer: Diagnosing 2015 poorly differentiated status post radiation, he had an EGD on October 2016 that showed no evidence of malignancy. Follow-up with oncology.  Diabetes mellitus, type 2 ([place]): No changes made to his medication.  COPD with emphysema ([place]): Continue inhalers.  Procedures:  EGD  Consultations:  GI  Discharge Exam: Filed Vitals:   07/06/15 2122 07/07/15 0615  BP: 101/56 100/59  Pulse: 94 87  Temp: 98.3 F (36.8 C)  98.3 F (36.8 C)  Resp: 16 16    General: A&O x3 Cardiovascular: RRR Respiratory: good air movement CTA B/L  Discharge Instructions   Discharge Instructions    Diet - low sodium heart healthy    Complete by:  As directed      Increase activity slowly    Complete by:  As directed           Current Discharge Medication List    START taking these medications   Details  pantoprazole (PROTONIX) 40 MG tablet Take 1 tablet (40 mg total) by mouth 2 (two) times daily. Qty: 60 tablet, Refills: 3      CONTINUE these medications which have NOT CHANGED   Details  albuterol (PROVENTIL HFA;VENTOLIN HFA) 108 (90 BASE) MCG/ACT inhaler Inhale 2 puffs into the lungs every 6 (six) hours as needed for wheezing.    albuterol (PROVENTIL) (2.5 MG/3ML) 0.083% nebulizer solution Take 2.5 mg by nebulization every 6 (six) hours as needed for wheezing.    bicalutamide (CASODEX) 50 MG tablet Take 50 mg by mouth daily.    esomeprazole (NEXIUM) 40 MG packet Take 40 mg by mouth daily before breakfast. Qty: 30 each, Refills: 0    ferrous sulfate 325 (65 FE) MG tablet Take 325 mg by mouth daily with breakfast.    Fluticasone-Salmeterol (ADVAIR) 250-50 MCG/DOSE AEPB Inhale 1 puff into the lungs every 12 (twelve) hours.    metFORMIN (GLUCOPHAGE) 500 MG tablet Take 1,000 mg by mouth 2 (two) times daily with a meal.    rosuvastatin (CRESTOR) 10  MG tablet Take 10 mg by mouth daily.    sitaGLIPtin (JANUVIA) 50 MG tablet Take 50 mg by mouth daily.    tamsulosin (FLOMAX) 0.4 MG CAPS Take 0.4 mg by mouth daily after breakfast.       No Known Allergies Follow-up Information    Follow up with [facility], MD In 1 week.   Specialty:  Oncology   Why:  hospital follow up for possible CA   Contact information:   [address] [address] [phone]        The results of significant diagnostics from this hospitalization (including imaging, microbiology, ancillary and laboratory) are  listed below for reference.    Significant Diagnostic Studies: Dg Chest Port 1 View  07/04/2015  CLINICAL DATA:  Dyspnea. EXAM: PORTABLE CHEST 1 VIEW COMPARISON:  March 13, 2015. FINDINGS: The heart size and mediastinal contours are within normal limits. Both lungs are clear. No pneumothorax or pleural effusion is noted. Elevated left hemidiaphragm is noted. The visualized skeletal structures are unremarkable. IMPRESSION: No acute cardiopulmonary abnormality seen. Electronically Signed   By: Marijo Conception, M.D.   On: 07/04/2015 21:33    Microbiology: No results found for this or any previous visit (from the past 240 hour(s)).   Labs: Basic Metabolic Panel:  Recent Labs Lab 07/04/15 1304  NA 140  K 3.8  CL 106  CO2 22  GLUCOSE 105*  BUN 10  CREATININE 0.54*  CALCIUM 8.2*   Liver Function Tests: No results for input(s): AST, ALT, ALKPHOS, BILITOT, PROT, ALBUMIN in the last 168 hours. No results for input(s): LIPASE, AMYLASE in the last 168 hours. No results for input(s): AMMONIA in the last 168 hours. CBC:  Recent Labs Lab 07/04/15 1304 07/04/15 2214 07/05/15 0520 07/05/15 1118 07/05/15 2042 07/06/15 1623  WBC 4.9 5.9 5.1 4.9 4.6 4.4  NEUTROABS 4.2  --   --   --   --   --   HGB 6.1* 7.5* 7.3* 7.5* 7.2* 7.6*  HCT 20.5* 24.3* 23.2* 23.4* 22.6* 24.4*  MCV 86.9 85.6 84.1 84.5 84.6 85.3  PLT 107* 89* 83* 79* 75* 67*   Cardiac Enzymes: No results for input(s): CKTOTAL, CKMB, CKMBINDEX, TROPONINI in the last 168 hours. BNP: BNP (last 3 results)  Recent Labs  03/13/15 1727 07/04/15 2214  BNP 92.4 132.3*    ProBNP (last 3 results) No results for input(s): PROBNP in the last 8760 hours.  CBG:  Recent Labs Lab 07/06/15 1647 07/06/15 2039 07/07/15 0004 07/07/15 0420 07/07/15 0812  GLUCAP 155* 153* 130* 112* 150*       Signed:  Charlynne Cousins MD.  [facility] 07/07/2015, 12:03 PM

## 2015-07-07 NOTE — Progress Notes (Signed)
CM spoke with pt who is refusing all [facility] services except those he has with CAPS.  Pt states he will be seeing his doctor (oncologist) this week and has CAPS at home.  CM notified RN.  No other CM needs were communicated.

## 2015-07-08 ENCOUNTER — Encounter (HOSPITAL_COMMUNITY): Payer: Self-pay | Admitting: Gastroenterology

## 2015-07-08 LAB — HEMOGLOBIN A1C
Hgb A1c MFr Bld: 6.1 % — ABNORMAL HIGH (ref 4.8–5.6)
Mean Plasma Glucose: 128 mg/dL

## 2015-07-10 ENCOUNTER — Telehealth: Payer: Self-pay | Admitting: *Deleted

## 2015-07-10 NOTE — Telephone Encounter (Signed)
Referral from Dr. Benson Norway for Dr. [name] to see again: gastric cancer has recurred. Offered appointment for 2/28 at 11:00. Was declined, saying "I'm seeing my primary care doc that day" and was not willing to change this appointment. Offered 07/17/15 at 11:00 and he accepted this.

## 2015-07-16 ENCOUNTER — Ambulatory Visit: Payer: Medicare Other | Admitting: Oncology

## 2015-07-16 ENCOUNTER — Telehealth: Payer: Self-pay | Admitting: *Deleted

## 2015-07-16 NOTE — Telephone Encounter (Signed)
"  What time will I need to come for my appointment tomorrow and where do is the appointment?" 11:00 am tomorrow.  [facility] is along side [address] he use vallet parking and arrive early enough to register for the appointment.  "I do not drive I use [employer] transportation."

## 2015-07-17 ENCOUNTER — Telehealth: Payer: Self-pay | Admitting: Oncology

## 2015-07-17 ENCOUNTER — Ambulatory Visit (HOSPITAL_BASED_OUTPATIENT_CLINIC_OR_DEPARTMENT_OTHER): Payer: Medicare Other

## 2015-07-17 ENCOUNTER — Ambulatory Visit (HOSPITAL_BASED_OUTPATIENT_CLINIC_OR_DEPARTMENT_OTHER): Payer: Medicare Other | Admitting: Oncology

## 2015-07-17 VITALS — BP 96/55 | HR 113 | Temp 97.8°F | Resp 18 | Ht 69.0 in | Wt 164.0 lb

## 2015-07-17 DIAGNOSIS — C61 Malignant neoplasm of prostate: Secondary | ICD-10-CM

## 2015-07-17 DIAGNOSIS — D509 Iron deficiency anemia, unspecified: Secondary | ICD-10-CM

## 2015-07-17 DIAGNOSIS — C169 Malignant neoplasm of stomach, unspecified: Secondary | ICD-10-CM

## 2015-07-17 LAB — CBC WITH DIFFERENTIAL/PLATELET
BASO%: 0.2 % (ref 0.0–2.0)
BASOS ABS: 0 10*3/uL (ref 0.0–0.1)
EOS%: 0 % (ref 0.0–7.0)
Eosinophils Absolute: 0 10*3/uL (ref 0.0–0.5)
HEMATOCRIT: 22.9 % — AB (ref 38.4–49.9)
HEMOGLOBIN: 7.3 g/dL — AB (ref 13.0–17.1)
LYMPH#: 0.3 10*3/uL — AB (ref 0.9–3.3)
LYMPH%: 6.7 % — AB (ref 14.0–49.0)
MCH: 27.5 pg (ref 27.2–33.4)
MCHC: 31.9 g/dL — AB (ref 32.0–36.0)
MCV: 86.4 fL (ref 79.3–98.0)
MONO#: 0.5 10*3/uL (ref 0.1–0.9)
MONO%: 9.4 % (ref 0.0–14.0)
NEUT#: 4.1 10*3/uL (ref 1.5–6.5)
NEUT%: 83.7 % — AB (ref 39.0–75.0)
PLATELETS: 86 10*3/uL — AB (ref 140–400)
RBC: 2.65 10*6/uL — ABNORMAL LOW (ref 4.20–5.82)
RDW: 19.2 % — ABNORMAL HIGH (ref 11.0–14.6)
WBC: 4.9 10*3/uL (ref 4.0–10.3)
nRBC: 1 % — ABNORMAL HIGH (ref 0–0)

## 2015-07-17 LAB — TECHNOLOGIST REVIEW

## 2015-07-17 LAB — FERRITIN: FERRITIN: 228 ng/mL (ref 22–316)

## 2015-07-17 LAB — IRON AND TIBC
%SAT: 34 % (ref 20–55)
Iron: 61 ug/dL (ref 42–163)
TIBC: 179 ug/dL — ABNORMAL LOW (ref 202–409)
UIBC: 118 ug/dL (ref 117–376)

## 2015-07-17 NOTE — Progress Notes (Signed)
Hematology and Oncology Follow Up Visit  LANKFORD PARI [id number] 11/05/1929 82 y.o. 07/17/2015 10:59 AM [name]., MDShelton, Joelene Millin, MD   Principle Diagnosis: 82 year old gentleman with the following diagnoses:  1. Advanced prostate cancer hormone sensitive at this time he sees androgen depravation with Lupron and Casodex under the care of Dr. Janice Norrie.   2. Gastric cancer diagnosed in July of 2015 presented with acute GI bleed from an antral ulcer. His disease is localized to the stomach without any evidence of metastasis.  Past therapy: Radiation therapy for palliative purposes for his gastric cancer. He is not a candidate for surgical resection. This was completed in October of 2015.  Interim History:  Mr. Lamunyon presents today for a followup visit. Since his last visit, he missed follow-up appointments on few occasions because of hospitalization and illnesses. He was hospitalized recently in February 2017 and was discharged on 07/07/2015. He was found to have iron deficiency anemia and GI blood loss. Endoscopy performed at that time revealed local cancer recurrence. He did receive packed red cell transfusions on 07/04/2015.  Since his discharge, he is improving but still rather frail and overall have declined performance status and activity level. He still ambulating with the help of a cane and did not report any falls or syncope. He does report melena but no hematochezia or hemoptysis. He does report neck pain but no other discomfort. He still lives independently and his niece checks on him periodically.   He is not report any headaches or blurry vision or syncope. His performance status is limited. He does not report any chest pain palpitations orthopnea or PND. Does not report any cough or dyspnea exertion or hemoptysis. He is not reporting frequency urgency or hesitancy. Does not report any skeletal pain back pain or shoulder pain or hip pain or any rib pain. He does not report  any lymphadenopathy or petechiae or bleeding. He has not reported any anxiety or depression. The rest of review of review of systems unremarkable.    Medications: I have reviewed the patient's current medications.  Current Outpatient Prescriptions  Medication Sig Dispense Refill  . albuterol (PROVENTIL HFA;VENTOLIN HFA) 108 (90 BASE) MCG/ACT inhaler Inhale 2 puffs into the lungs every 6 (six) hours as needed for wheezing.    [name] albuterol (PROVENTIL) (2.5 MG/3ML) 0.083% nebulizer solution Take 2.5 mg by nebulization every 6 (six) hours as needed for wheezing.    . bicalutamide (CASODEX) 50 MG tablet Take 50 mg by mouth daily.    . ferrous sulfate 325 (65 FE) MG tablet Take 325 mg by mouth daily with breakfast.    . Fluticasone-Salmeterol (ADVAIR) 250-50 MCG/DOSE AEPB Inhale 1 puff into the lungs every 12 (twelve) hours.    [name] ipratropium (ATROVENT) 0.02 % nebulizer solution As directed  11  . memantine (NAMENDA) 10 MG tablet As needed  5  . metFORMIN (GLUCOPHAGE) 500 MG tablet Take 1,000 mg by mouth 2 (two) times daily with a meal.    . pantoprazole (PROTONIX) 40 MG tablet Take 1 tablet (40 mg total) by mouth 2 (two) times daily. 60 tablet 3  . rosuvastatin (CRESTOR) 10 MG tablet Take 10 mg by mouth daily.    . sitaGLIPtin (JANUVIA) 50 MG tablet Take 50 mg by mouth daily.    . tamsulosin (FLOMAX) 0.4 MG CAPS Take 0.4 mg by mouth daily after breakfast.     No current facility-administered medications for this visit.     Allergies: No Known Allergies  Past Medical  History, Surgical history, Social history, and Family History were reviewed and updated.  Physical Exam: Blood pressure 96/55, pulse 113, temperature 97.8 F (36.6 C), temperature source Oral, resp. rate 18, height 5\' 9"  (1.753 m), weight 164 lb (74.39 kg), SpO2 100 %. ECOG: 1 General appearance: alert and cooperative appeared in no active distress. Head: Normocephalic, without obvious abnormality Neck: no adenopathy Lymph  nodes: Cervical, supraclavicular, and axillary nodes normal. Heart:regular rate and rhythm, S1, S2 normal, no murmur, click, rub or gallop Lung:chest clear, no wheezing, rales, normal symmetric air entry Abdomin: soft, non-tender, without masses or organomegaly EXT:no erythema, induration, or nodules   Lab Results: Lab Results  Component Value Date   WBC 4.4 07/06/2015   HGB 7.6* 07/06/2015   HCT 24.4* 07/06/2015   MCV 85.3 07/06/2015   PLT 67* 07/06/2015     Chemistry      Component Value Date/Time   NA 140 07/04/2015 1304   NA 140 08/28/2014 0903   K 3.8 07/04/2015 1304   K 4.1 08/28/2014 0903   CL 106 07/04/2015 1304   CO2 22 07/04/2015 1304   CO2 22 08/28/2014 0903   BUN 10 07/04/2015 1304   BUN 10.9 08/28/2014 0903   CREATININE 0.54* 07/04/2015 1304   CREATININE 0.7 08/28/2014 0903      Component Value Date/Time   CALCIUM 8.2* 07/04/2015 1304   CALCIUM 9.0 08/28/2014 0903   ALKPHOS 706* 03/15/2015 0505   ALKPHOS 198* 08/28/2014 0903   AST 19 03/15/2015 0505   AST 11 08/28/2014 0903   ALT 9* 03/15/2015 0505   ALT 8 08/28/2014 0903   BILITOT 1.5* 03/15/2015 0505   BILITOT 0.39 08/28/2014 0903        Impression and Plan:  82 year old gentleman with the following issues:  1.Gastric cancer presented with acute GI bleeding and an antral ulcer diagnosed in July 2015. He is status post radiation therapy that concluded in October 2015. His most recent endoscopy showed local recurrence.  The natural course of this disease was discussed with the patient and his family today. Unfortunately, he is not a candidate for any aggressive therapy. He is not a candidate for surgical resection and no further radiation can be given. Systemic chemotherapy will offer very little benefit at this particular setting and he is a poor candidate for it. He is rather frail, debilitated with other health issues and history of prostate cancer. I do not think he is a candidate for multi-agent  chemotherapy with oral Xeloda could be an option. I do not think this medication will offer much palliation for his symptoms and mild likely worsen his quality of life.  After discussion today we have elected to proceed with supportive care only and consider hospice if he develops further decline.  He understands that his prognosis is rather poor with limited life expectancy given his 2 malignancies.  2. Iron deficiency anemia: I will recheck his hemoglobin and iron studies and likely would benefit from iron infusion. Risks and benefits were discussed and he is agreeable to proceed and we'll set that up for him.  3. Advanced prostate cancer: His cancer was hormone sensitive and was receiving hormone therapy in the care of Dr. Janice Norrie. It is unclear if he still receiving hormone therapy but I do not think prostate cancer would be a major cause for morbidity at this time. We'll continue to monitor him from that standpoint as well.  4. Follow-up: Will be in 2 months to check his status.  [id number], MD 3/1/201710:59 AM

## 2015-07-17 NOTE — Telephone Encounter (Signed)
appt made and avs printed °

## 2015-07-20 ENCOUNTER — Encounter (HOSPITAL_COMMUNITY): Payer: Self-pay | Admitting: Emergency Medicine

## 2015-07-20 ENCOUNTER — Inpatient Hospital Stay (HOSPITAL_COMMUNITY)
Admission: EM | Admit: 2015-07-20 | Discharge: 2015-07-26 | DRG: 374 | Disposition: A | Discharge status: Skilled Nursing Facility | Payer: Medicare Other | Attending: Internal Medicine | Admitting: Internal Medicine

## 2015-07-20 ENCOUNTER — Other Ambulatory Visit: Payer: Self-pay

## 2015-07-20 ENCOUNTER — Emergency Department (HOSPITAL_COMMUNITY): Payer: Medicare Other

## 2015-07-20 DIAGNOSIS — Z7984 Long term (current) use of oral hypoglycemic drugs: Secondary | ICD-10-CM

## 2015-07-20 DIAGNOSIS — Z7189 Other specified counseling: Secondary | ICD-10-CM | POA: Insufficient documentation

## 2015-07-20 DIAGNOSIS — C162 Malignant neoplasm of body of stomach: Secondary | ICD-10-CM | POA: Diagnosis not present

## 2015-07-20 DIAGNOSIS — Z66 Do not resuscitate: Secondary | ICD-10-CM | POA: Diagnosis present

## 2015-07-20 DIAGNOSIS — Z515 Encounter for palliative care: Secondary | ICD-10-CM | POA: Insufficient documentation

## 2015-07-20 DIAGNOSIS — E119 Type 2 diabetes mellitus without complications: Secondary | ICD-10-CM

## 2015-07-20 DIAGNOSIS — D696 Thrombocytopenia, unspecified: Secondary | ICD-10-CM | POA: Diagnosis present

## 2015-07-20 DIAGNOSIS — C169 Malignant neoplasm of stomach, unspecified: Secondary | ICD-10-CM | POA: Diagnosis present

## 2015-07-20 DIAGNOSIS — J438 Other emphysema: Secondary | ICD-10-CM

## 2015-07-20 DIAGNOSIS — R0602 Shortness of breath: Secondary | ICD-10-CM | POA: Diagnosis not present

## 2015-07-20 DIAGNOSIS — R600 Localized edema: Secondary | ICD-10-CM | POA: Diagnosis present

## 2015-07-20 DIAGNOSIS — C163 Malignant neoplasm of pyloric antrum: Principal | ICD-10-CM | POA: Diagnosis present

## 2015-07-20 DIAGNOSIS — Z923 Personal history of irradiation: Secondary | ICD-10-CM

## 2015-07-20 DIAGNOSIS — C61 Malignant neoplasm of prostate: Secondary | ICD-10-CM | POA: Diagnosis present

## 2015-07-20 DIAGNOSIS — D630 Anemia in neoplastic disease: Secondary | ICD-10-CM | POA: Diagnosis present

## 2015-07-20 DIAGNOSIS — Z87891 Personal history of nicotine dependence: Secondary | ICD-10-CM

## 2015-07-20 DIAGNOSIS — Z833 Family history of diabetes mellitus: Secondary | ICD-10-CM

## 2015-07-20 DIAGNOSIS — J439 Emphysema, unspecified: Secondary | ICD-10-CM | POA: Diagnosis present

## 2015-07-20 DIAGNOSIS — E7800 Pure hypercholesterolemia, unspecified: Secondary | ICD-10-CM | POA: Diagnosis present

## 2015-07-20 DIAGNOSIS — K922 Gastrointestinal hemorrhage, unspecified: Secondary | ICD-10-CM | POA: Diagnosis present

## 2015-07-20 DIAGNOSIS — J9610 Chronic respiratory failure, unspecified whether with hypoxia or hypercapnia: Secondary | ICD-10-CM | POA: Diagnosis present

## 2015-07-20 DIAGNOSIS — E43 Unspecified severe protein-calorie malnutrition: Secondary | ICD-10-CM | POA: Diagnosis present

## 2015-07-20 DIAGNOSIS — D649 Anemia, unspecified: Secondary | ICD-10-CM | POA: Diagnosis present

## 2015-07-20 DIAGNOSIS — J441 Chronic obstructive pulmonary disease with (acute) exacerbation: Secondary | ICD-10-CM | POA: Diagnosis present

## 2015-07-20 LAB — I-STAT TROPONIN, ED: TROPONIN I, POC: 0.02 ng/mL (ref 0.00–0.08)

## 2015-07-20 LAB — I-STAT ARTERIAL BLOOD GAS, ED
Bicarbonate: 23 mEq/L (ref 20.0–24.0)
O2 SAT: 97 %
PCO2 ART: 32.1 mmHg — AB (ref 35.0–45.0)
PH ART: 7.462 — AB (ref 7.350–7.450)
TCO2: 24 mmol/L (ref 0–100)
pO2, Arterial: 89 mmHg (ref 80.0–100.0)

## 2015-07-20 LAB — CBC
HCT: 21.6 % — ABNORMAL LOW (ref 39.0–52.0)
HCT: 20.5 % — ABNORMAL LOW (ref 39.0–52.0)
Hemoglobin: 6.9 g/dL — CL (ref 13.0–17.0)
Hemoglobin: 6.6 g/dL — CL (ref 13.0–17.0)
MCH: 27.2 pg (ref 26.0–34.0)
MCH: 27.2 pg (ref 26.0–34.0)
MCHC: 31.9 g/dL (ref 30.0–36.0)
MCHC: 32.2 g/dL (ref 30.0–36.0)
MCV: 85 fL (ref 78.0–100.0)
MCV: 84.4 fL (ref 78.0–100.0)
PLATELETS: 73 10*3/uL — AB (ref 150–400)
PLATELETS: 70 10*3/uL — AB (ref 150–400)
RBC: 2.54 MIL/uL — AB (ref 4.22–5.81)
RBC: 2.43 MIL/uL — AB (ref 4.22–5.81)
RDW: 18.3 % — ABNORMAL HIGH (ref 11.5–15.5)
RDW: 18.2 % — ABNORMAL HIGH (ref 11.5–15.5)
WBC: 3.4 10*3/uL — AB (ref 4.0–10.5)
WBC: 3.4 10*3/uL — AB (ref 4.0–10.5)

## 2015-07-20 LAB — COMPREHENSIVE METABOLIC PANEL
ALBUMIN: 2.4 g/dL — AB (ref 3.5–5.0)
ALK PHOS: 680 U/L — AB (ref 38–126)
ALT: 12 U/L — AB (ref 17–63)
AST: 29 U/L (ref 15–41)
Anion gap: 13 (ref 5–15)
BILIRUBIN TOTAL: 1.3 mg/dL — AB (ref 0.3–1.2)
BUN: 25 mg/dL — AB (ref 6–20)
CALCIUM: 8.3 mg/dL — AB (ref 8.9–10.3)
CO2: 22 mmol/L (ref 22–32)
CREATININE: 0.61 mg/dL (ref 0.61–1.24)
Chloride: 101 mmol/L (ref 101–111)
GFR calc Af Amer: >60 mL/min (ref >60)
GFR calc non Af Amer: >60 mL/min (ref >60)
GLUCOSE: 183 mg/dL — AB (ref 65–99)
Potassium: 3.9 mmol/L (ref 3.5–5.1)
SODIUM: 136 mmol/L (ref 135–145)
TOTAL PROTEIN: 5.5 g/dL — AB (ref 6.5–8.1)

## 2015-07-20 LAB — CBC WITH DIFFERENTIAL/PLATELET
BASOS ABS: 0 10*3/uL (ref 0.0–0.1)
BASOS PCT: 0 %
EOS ABS: 0 10*3/uL (ref 0.0–0.7)
EOS PCT: 0 %
HEMATOCRIT: 20 % — AB (ref 39.0–52.0)
Hemoglobin: 6.3 g/dL — CL (ref 13.0–17.0)
Lymphocytes Relative: 6 %
Lymphs Abs: 0.3 10*3/uL — ABNORMAL LOW (ref 0.7–4.0)
MCH: 26.8 pg (ref 26.0–34.0)
MCHC: 31.5 g/dL (ref 30.0–36.0)
MCV: 85.1 fL (ref 78.0–100.0)
MONO ABS: 0.4 10*3/uL (ref 0.1–1.0)
MONOS PCT: 8 %
Neutro Abs: 3.8 10*3/uL (ref 1.7–7.7)
Neutrophils Relative %: 86 %
PLATELETS: 80 10*3/uL — AB (ref 150–400)
RBC: 2.35 MIL/uL — ABNORMAL LOW (ref 4.22–5.81)
RDW: 19.4 % — AB (ref 11.5–15.5)
WBC: 4.4 10*3/uL (ref 4.0–10.5)

## 2015-07-20 LAB — PREALBUMIN: PREALBUMIN: 3.6 mg/dL — AB (ref 18–38)

## 2015-07-20 LAB — BRAIN NATRIURETIC PEPTIDE: B NATRIURETIC PEPTIDE 5: 187.9 pg/mL — AB (ref 0.0–100.0)

## 2015-07-20 LAB — PREPARE RBC (CROSSMATCH)

## 2015-07-20 LAB — GLUCOSE, CAPILLARY
GLUCOSE-CAPILLARY: 157 mg/dL — AB (ref 65–99)
GLUCOSE-CAPILLARY: 111 mg/dL — AB (ref 65–99)
Glucose-Capillary: 134 mg/dL — ABNORMAL HIGH (ref 65–99)

## 2015-07-20 LAB — I-STAT CG4 LACTIC ACID, ED: LACTIC ACID, VENOUS: 1.56 mmol/L (ref 0.5–2.0)

## 2015-07-20 MED ORDER — MORPHINE SULFATE (PF) 2 MG/ML IV SOLN: 1.0000 mg | INTRAVENOUS | Status: DC | PRN

## 2015-07-20 MED ORDER — ACETAMINOPHEN 325 MG PO TABS
650.0000 mg | ORAL_TABLET | Freq: Four times a day (QID) | ORAL | Status: DC | PRN
  Administered 2015-07-23: 650 mg via ORAL
  Filled 2015-07-20: qty 2

## 2015-07-20 MED ORDER — ONDANSETRON HCL 4 MG PO TABS: 4.0000 mg | ORAL_TABLET | Freq: Four times a day (QID) | ORAL | Status: DC | PRN

## 2015-07-20 MED ORDER — ALBUTEROL SULFATE (2.5 MG/3ML) 0.083% IN NEBU
2.5000 mg | INHALATION_SOLUTION | Freq: Four times a day (QID) | RESPIRATORY_TRACT | Status: DC | PRN
  Administered 2015-07-20 – 2015-07-23 (×4): 2.5 mg via RESPIRATORY_TRACT
  Filled 2015-07-20 (×4): qty 3

## 2015-07-20 MED ORDER — ACETAMINOPHEN 650 MG RE SUPP
650.0000 mg | Freq: Four times a day (QID) | RECTAL | Status: DC | PRN
Start: 2015-07-20 — End: 2015-07-26

## 2015-07-20 MED ORDER — ALBUTEROL SULFATE (2.5 MG/3ML) 0.083% IN NEBU: 5.0000 mg | INHALATION_SOLUTION | Freq: Once | RESPIRATORY_TRACT | Status: DC

## 2015-07-20 MED ORDER — ALBUTEROL SULFATE HFA 108 (90 BASE) MCG/ACT IN AERS: 2.0000 | INHALATION_SPRAY | Freq: Four times a day (QID) | RESPIRATORY_TRACT | Status: DC | PRN

## 2015-07-20 MED ORDER — ALBUTEROL SULFATE (2.5 MG/3ML) 0.083% IN NEBU: 2.5000 mg | INHALATION_SOLUTION | Freq: Four times a day (QID) | RESPIRATORY_TRACT | Status: DC | PRN

## 2015-07-20 MED ORDER — PANTOPRAZOLE SODIUM 40 MG IV SOLR
40.0000 mg | Freq: Two times a day (BID) | INTRAVENOUS | Status: DC
  Administered 2015-07-20 – 2015-07-26 (×13): 40 mg via INTRAVENOUS
  Filled 2015-07-20 (×13): qty 40

## 2015-07-20 MED ORDER — TAMSULOSIN HCL 0.4 MG PO CAPS
0.4000 mg | ORAL_CAPSULE | Freq: Every day | ORAL | Status: DC
  Administered 2015-07-20 – 2015-07-26 (×7): 0.4 mg via ORAL
  Filled 2015-07-20 (×7): qty 1

## 2015-07-20 MED ORDER — FERROUS SULFATE 325 (65 FE) MG PO TABS
325.0000 mg | ORAL_TABLET | Freq: Every day | ORAL | Status: DC
  Administered 2015-07-21 – 2015-07-26 (×6): 325 mg via ORAL
  Filled 2015-07-20 (×6): qty 1

## 2015-07-20 MED ORDER — INSULIN ASPART 100 UNIT/ML ~~LOC~~ SOLN
0.0000 [IU] | Freq: Three times a day (TID) | SUBCUTANEOUS | Status: DC
  Administered 2015-07-20: 3 [IU] via SUBCUTANEOUS
  Administered 2015-07-21 (×3): 2 [IU] via SUBCUTANEOUS

## 2015-07-20 MED ORDER — IPRATROPIUM-ALBUTEROL 0.5-2.5 (3) MG/3ML IN SOLN
3.0000 mL | Freq: Once | RESPIRATORY_TRACT | Status: AC
Start: 2015-07-20 — End: 2015-07-20
  Administered 2015-07-20: 3 mL via RESPIRATORY_TRACT
  Filled 2015-07-20: qty 6

## 2015-07-20 MED ORDER — INSULIN ASPART 100 UNIT/ML ~~LOC~~ SOLN: 0.0000 [IU] | Freq: Every day | SUBCUTANEOUS | Status: DC

## 2015-07-20 MED ORDER — TRAZODONE HCL 50 MG PO TABS
25.0000 mg | ORAL_TABLET | Freq: Every evening | ORAL | Status: DC | PRN
  Administered 2015-07-20 – 2015-07-21 (×2): 25 mg via ORAL
  Filled 2015-07-20 (×2): qty 1

## 2015-07-20 MED ORDER — ONDANSETRON HCL 4 MG/2ML IJ SOLN: 4.0000 mg | Freq: Four times a day (QID) | INTRAMUSCULAR | Status: DC | PRN

## 2015-07-20 MED ORDER — SODIUM CHLORIDE 0.9% FLUSH
3.0000 mL | Freq: Two times a day (BID) | INTRAVENOUS | Status: DC
  Administered 2015-07-20 – 2015-07-26 (×12): 3 mL via INTRAVENOUS

## 2015-07-20 MED ORDER — BICALUTAMIDE 50 MG PO TABS
50.0000 mg | ORAL_TABLET | Freq: Every day | ORAL | Status: DC
  Administered 2015-07-20 – 2015-07-26 (×7): 50 mg via ORAL
  Filled 2015-07-20 (×14): qty 1

## 2015-07-20 MED ORDER — HYDROCOD POLST-CPM POLST ER 10-8 MG/5ML PO SUER
5.0000 mL | Freq: Two times a day (BID) | ORAL | Status: DC
Start: 2015-07-20 — End: 2015-07-26
  Administered 2015-07-20 – 2015-07-26 (×13): 5 mL via ORAL
  Filled 2015-07-20 (×13): qty 5

## 2015-07-20 MED ORDER — BENZONATATE 100 MG PO CAPS
100.0000 mg | ORAL_CAPSULE | Freq: Three times a day (TID) | ORAL | Status: DC | PRN
  Administered 2015-07-20 – 2015-07-23 (×7): 100 mg via ORAL
  Filled 2015-07-20 (×8): qty 1

## 2015-07-20 MED ORDER — SODIUM CHLORIDE 0.9 % IV SOLN: 10.0000 mL/h | Freq: Once | INTRAVENOUS | Status: DC

## 2015-07-20 MED ORDER — HYDROCODONE-ACETAMINOPHEN 5-325 MG PO TABS
1.0000 | ORAL_TABLET | ORAL | Status: DC | PRN
  Administered 2015-07-21: 2 via ORAL
  Administered 2015-07-22 – 2015-07-25 (×2): 1 via ORAL
  Filled 2015-07-20 (×2): qty 1
  Filled 2015-07-20: qty 2
  Filled 2015-07-20: qty 1

## 2015-07-20 MED ORDER — ALBUTEROL SULFATE (2.5 MG/3ML) 0.083% IN NEBU
2.5000 mg | INHALATION_SOLUTION | RESPIRATORY_TRACT | Status: DC
  Administered 2015-07-20: 2.5 mg via RESPIRATORY_TRACT
  Filled 2015-07-20: qty 3

## 2015-07-20 MED ORDER — MOMETASONE FURO-FORMOTEROL FUM 200-5 MCG/ACT IN AERO
2.0000 | INHALATION_SPRAY | Freq: Two times a day (BID) | RESPIRATORY_TRACT | Status: DC
  Administered 2015-07-20 – 2015-07-26 (×10): 2 via RESPIRATORY_TRACT
  Filled 2015-07-20: qty 8.8

## 2015-07-20 NOTE — ED Provider Notes (Signed)
CSN: [id number]     Arrival date & time 07/20/15  [id number] History   First MD Initiated Contact with Patient 07/20/15 0730     Chief Complaint  Patient presents with  . Shortness of Breath  . Chest Pain     The history is provided by the patient and a caregiver. No language interpreter was used.   Melvin Gardner is a 82 y.o. male who presents to the Emergency Department complaining of SOB. History is provided by the patient and his caregiver. Over the last week he has had increased cough with sputum production, shortness of breath. He felt warm to the touch according to his caregiver today . He has lower extremity edema for the last 2 months. He is currently followed by oncology for low blood counts and is scheduled for a blood transfusion in a few days. He has a history of advanced prostate cancer as well as stomach cancer. Sxs are severe, constant, worsening.   Past Medical History  Diagnosis Date  . Asthma   . High cholesterol   . Type II diabetes mellitus ([place])   . Anemia   . Arthritis     "left knee" (11/23/2013)  . COPD (chronic obstructive pulmonary disease) ([place])     [name] 09/17/2010  . Prostate cancer ([place]) 06/03/2001    bilat involvement,adenocarcinoma,gleason 6(3+3) PSA=37.6  . Stomach cancer ([place]) 11/24/13    adenocarcinoma   . History of blood transfusion 11/23/2013    related to lower GI bleeding  . History of radiation therapy 03/28/2003-05/25/2003    Prostate  . Glaucoma   . Hx of radiation therapy 01/15/14-109/15    gastric cancer   Past Surgical History  Procedure Laterality Date  . Tumor excision  10 years ago    "off my spinal cord"  . Hemorrhoid surgery  09/2000    [name] 09/30/2010  . Cardiac catheterization      [name] 09/30/2010  . Esophagogastroduodenoscopy Left 11/24/2013    Procedure: ESOPHAGOGASTRODUODENOSCOPY (EGD);  Surgeon: Juanita Craver, MD;  Location[facility] ENDOSCOPY;  Service: Endoscopy;  Laterality: Left;  . Esophagogastroduodenoscopy N/A 03/15/2015     Procedure: ESOPHAGOGASTRODUODENOSCOPY (EGD);  Surgeon: [name], MD;  Location: [facility] ENDOSCOPY;  Service: Endoscopy;  Laterality: N/A;  . Esophagogastroduodenoscopy N/A 07/05/2015    Procedure: ESOPHAGOGASTRODUODENOSCOPY (EGD);  Surgeon: [name], MD;  Location[facility] ENDOSCOPY;  Service: Endoscopy;  Laterality: N/A;   Family History  Problem Relation Age of Onset  . Diabetes Mellitus II Mother   . Other Father    Social History  Substance Use Topics  . Smoking status: Former Smoker -- 1.00 packs/day for 5 years    Types: Cigarettes  . Smokeless tobacco: Never Used     Comment: "quit smoking in the 1950's"  . Alcohol Use: No     Comment: 11/23/2013 "don't drink anything now; never was a drinker" quit drugs years ago    Review of Systems  All other systems reviewed and are negative.     Allergies  Review of patient's allergies indicates no known allergies.  Home Medications   Prior to Admission medications   Medication Sig Start Date End Date Taking? Authorizing Provider  acetaminophen (TYLENOL) 500 MG tablet Take 500 mg by mouth every 6 (six) hours as needed for mild pain.   Yes Historical Provider, MD  albuterol (PROVENTIL HFA;VENTOLIN HFA) 108 (90 BASE) MCG/ACT inhaler Inhale 2 puffs into the lungs every 6 (six) hours as needed for wheezing.   Yes Historical Provider, MD  albuterol (PROVENTIL) (2.5 MG/3ML) 0.083% nebulizer solution Take 2.5 mg by nebulization every 6 (six) hours as needed for wheezing.   Yes Historical Provider, MD  bicalutamide (CASODEX) 50 MG tablet Take 50 mg by mouth daily. 11/16/13  Yes Historical Provider, MD  ferrous sulfate 325 (65 FE) MG tablet Take 325 mg by mouth daily with breakfast.   Yes Historical Provider, MD  Fluticasone-Salmeterol (ADVAIR) 250-50 MCG/DOSE AEPB Inhale 1 puff into the lungs every 12 (twelve) hours.   Yes Historical Provider, MD  ipratropium (ATROVENT) 0.02 % nebulizer solution As directed 06/17/15  Yes Historical Provider, MD   memantine (NAMENDA) 10 MG tablet Take 10 mg by mouth. As needed 05/29/15  Yes Historical Provider, MD  metFORMIN (GLUCOPHAGE) 500 MG tablet Take 1,000 mg by mouth 2 (two) times daily with a meal.   Yes Historical Provider, MD  pantoprazole (PROTONIX) 40 MG tablet Take 1 tablet (40 mg total) by mouth 2 (two) times daily. 07/07/15  Yes Charlynne Cousins, MD  rosuvastatin (CRESTOR) 10 MG tablet Take 10 mg by mouth daily.   Yes Historical Provider, MD  sitaGLIPtin (JANUVIA) 50 MG tablet Take 50 mg by mouth daily.   Yes Historical Provider, MD  tamsulosin (FLOMAX) 0.4 MG CAPS Take 0.4 mg by mouth daily after breakfast.   Yes Historical Provider, MD   BP 109/59 mmHg  Pulse 118  Temp(Src) 98.2 F (36.8 C) (Oral)  Resp 20  Ht 5\' 9"  (1.753 m)  Wt 156 lb 1.4 oz (70.8 kg)  BMI 23.04 kg/m2  SpO2 99% Physical Exam  Constitutional: He is oriented to person, place, and time. He appears well-developed and well-nourished.  HENT:  Head: Normocephalic and atraumatic.  Cardiovascular: Normal rate and regular rhythm.   No murmur heard. Pulmonary/Chest: Effort normal.  Tachypnea with rhonchi bilaterally, occasional end expiratory wheezes  Abdominal: Soft. There is no tenderness. There is no rebound and no guarding.  Musculoskeletal: He exhibits no tenderness.  3+ pitting edema in BLE  Neurological: He is alert and oriented to person, place, and time.  Skin: Skin is warm and dry. There is pallor.  Psychiatric: He has a normal mood and affect. His behavior is normal.  Nursing note and vitals reviewed.   ED Course  Procedures (including critical care time) Labs Review Labs Reviewed  COMPREHENSIVE METABOLIC PANEL - Abnormal; Notable for the following:    Glucose, Bld 183 (*)    BUN 25 (*)    Calcium 8.3 (*)    Total Protein 5.5 (*)    Albumin 2.4 (*)    ALT 12 (*)    Alkaline Phosphatase 680 (*)    Total Bilirubin 1.3 (*)    All other components within normal limits  CBC WITH  DIFFERENTIAL/PLATELET - Abnormal; Notable for the following:    RBC 2.35 (*)    Hemoglobin 6.3 (*)    HCT 20.0 (*)    RDW 19.4 (*)    Platelets 80 (*)    Lymphs Abs 0.3 (*)    All other components within normal limits  BRAIN NATRIURETIC PEPTIDE - Abnormal; Notable for the following:    B Natriuretic Peptide 187.9 (*)    All other components within normal limits  PREALBUMIN - Abnormal; Notable for the following:    Prealbumin 3.6 (*)    All other components within normal limits  GLUCOSE, CAPILLARY - Abnormal; Notable for the following:    Glucose-Capillary 157 (*)    All other components within normal limits  I-STAT  ARTERIAL BLOOD GAS, ED - Abnormal; Notable for the following:    pH, Arterial 7.462 (*)    pCO2 arterial 32.1 (*)    All other components within normal limits  HEMOGLOBIN A1C  CBC  I-STAT TROPOININ, ED  I-STAT CG4 LACTIC ACID, ED  TYPE AND SCREEN  PREPARE RBC (CROSSMATCH)    Imaging Review Dg Chest 2 View  07/20/2015  CLINICAL DATA:  Worsening shortness of breath this morning, chronic cough with history of asthma, former smoker ; repeat PA view to exclude pneumothorax EXAM: CHEST  2 VIEW COMPARISON:  07/20/2015 FINDINGS: Expiratory PA radiograph confirms the absence of pneumothorax. Findings otherwise unchanged with mild left base atelectasis. IMPRESSION: No pneumothorax. Electronically Signed   By: Skipper Cliche M.D.   On: 07/20/2015 09:32   Dg Chest Port 1 View  07/20/2015  CLINICAL DATA:  Shortness of breath getting worse this morning, chronic cough EXAM: PORTABLE CHEST 1 VIEW COMPARISON:  07/04/2015 FINDINGS: Mild elevation of the left diaphragm, stable. Mild left base atelectasis. Heart size upper normal and stable. Vascular pattern normal. Right lung is free of infiltrate or opacity. No pleural effusions. A probable skin fold over the lateral right lung simulates a pneumothorax. IMPRESSION: An apparent skin fold simulates a right pneumothorax. To confirm this,  repeat PA view in expiration recommended. Study otherwise negative except for mild left base atelectasis. Electronically Signed   By: Skipper Cliche M.D.   On: 07/20/2015 08:36   I have personally reviewed and evaluated these images and lab results as part of my medical decision-making.   EKG Interpretation None      MDM   Final diagnoses:  None    Patient here for worsening shortness of breath and cough. He is chronically ill appearing on examination with frequent coughing and tachypnea. Source of dyspnea is likely mixed and due to his significant anemia, COPD exacerbation, and his heart failure. Providing a blood transfusion but patient will need reassessment following each unit of blood to assess his volume status. Discussed with patient critical nature of his illness with multiple comorbidities and that treatment options are very limited given his advanced cancer. He does requests treatments to help his symptoms including blood transfusion with a goal of being alive until his children can visit. Hospitalist consulted for admission and palliative care consult placed as well.    Quintella Reichert, MD 07/20/15 [phone]

## 2015-07-20 NOTE — ED Notes (Signed)
Patient given a warm blanket. 

## 2015-07-20 NOTE — Progress Notes (Signed)
Patient arrived on unit via stretcher. Kennyth Lose, family friend, at bedside. Telemetry placed per MD order and CMT notified.  Skin assessment complete.

## 2015-07-20 NOTE — ED Notes (Signed)
Automatic monitor does not read appropriately on pt.  Manual to be taken.

## 2015-07-20 NOTE — ED Notes (Signed)
Pt from home via [facility] with c/o chest pain and SOB worsening this morning.  Pt's caregiver reports cough x "a couple of days" and feeling warm to touch.  12 lead EKG unremarkable.  NAD, A&O.

## 2015-07-20 NOTE — Progress Notes (Signed)
Called ED for report, spoke with Angela 

## 2015-07-20 NOTE — H&P (Signed)
[facility] History and Physical  Melvin Gardner [id number] DOB: 1929/07/05 DOA: 07/20/2015  Referring physician: Ralene Bathe PCP: [name]., MD   Chief Complaint: shortness of breath   HPI: Melvin Gardner is a delightful 82 y.o. male with a past medical history of diabetes, COPD, advanced prostate cancer as well as gastric cancer status post radiation since emergency department with the chief complaint of worsening shortness of breath increased cough and generalized weakness. Initial evaluation reveals acute on chronic anemia with a hemoglobin of 6.3 and tachycardia.  Information is obtained from the patient. He reports recent hospitalization discharged on a february 19th for seeing anemia related to ongoing GI bleed in the setting of advanced gastric cancer. Since he's been home he reports gradual worsening of shortness of breath increased sputum production and increased lower extremity edema. Associated symptoms include persistent loose stool that is "very very dark". He denies bright red blood per rectum. He denies abdominal pain nausea vomiting hematemesis. He denies chest pain palpitations headache dizziness syncope or near-syncope. He does report a gradual worsening of generalized weakness.  Emergency department he is afebrile and hemodynamically stable tachycardia of 114 he is not hypoxic.   Review of Systems:  10 point review of systems complete and all systems are negative except as indicated in the history of present illness  Past Medical History  Diagnosis Date  . Asthma   . High cholesterol   . Type II diabetes mellitus ([place])   . Anemia   . Arthritis     "left knee" (11/23/2013)  . COPD (chronic obstructive pulmonary disease) ([place])     [name] 09/17/2010  . Prostate cancer ([place]) 06/03/2001    bilat involvement,adenocarcinoma,gleason 6(3+3) PSA=37.6  . Stomach cancer ([place]) 11/24/13    adenocarcinoma   . History of blood transfusion 11/23/2013    related to lower GI  bleeding  . History of radiation therapy 03/28/2003-05/25/2003    Prostate  . Glaucoma   . Hx of radiation therapy 01/15/14-109/15    gastric cancer   Past Surgical History  Procedure Laterality Date  . Tumor excision  10 years ago    "off my spinal cord"  . Hemorrhoid surgery  09/2000    [name] 09/30/2010  . Cardiac catheterization      [name] 09/30/2010  . Esophagogastroduodenoscopy Left 11/24/2013    Procedure: ESOPHAGOGASTRODUODENOSCOPY (EGD);  Surgeon: Juanita Craver, MD;  Location: [facility] ENDOSCOPY;  Service: Endoscopy;  Laterality: Left;  . Esophagogastroduodenoscopy N/A 03/15/2015    Procedure: ESOPHAGOGASTRODUODENOSCOPY (EGD);  Surgeon: [name], MD;  Location: [facility] ENDOSCOPY;  Service: Endoscopy;  Laterality: N/A;  . Esophagogastroduodenoscopy N/A 07/05/2015    Procedure: ESOPHAGOGASTRODUODENOSCOPY (EGD);  Surgeon: [name], MD;  Location: [facility] ENDOSCOPY;  Service: Endoscopy;  Laterality: N/A;   Social History:  reports that he has quit smoking. His smoking use included Cigarettes. He has a 5 pack-year smoking history. He has never used smokeless tobacco. He reports that he does not drink alcohol or use illicit drugs. He lives alone he has a caregiver for 4 hours a day 7 days a week. He is mostly bedbound but can bear weight and pivot to lift chair and then bathroom full assist No Known Allergies  Family History  Problem Relation Age of Onset  . Diabetes Mellitus II Mother   . Other Father      Prior to Admission medications   Medication Sig Start Date End Date Taking? Authorizing Provider  acetaminophen (TYLENOL) 500 MG tablet Take 500 mg by mouth  every 6 (six) hours as needed for mild pain.   Yes Historical Provider, MD  albuterol (PROVENTIL HFA;VENTOLIN HFA) 108 (90 BASE) MCG/ACT inhaler Inhale 2 puffs into the lungs every 6 (six) hours as needed for wheezing.   Yes Historical Provider, MD  albuterol (PROVENTIL) (2.5 MG/3ML) 0.083% nebulizer solution Take 2.5 mg by nebulization  every 6 (six) hours as needed for wheezing.   Yes Historical Provider, MD  bicalutamide (CASODEX) 50 MG tablet Take 50 mg by mouth daily. 11/16/13  Yes Historical Provider, MD  ferrous sulfate 325 (65 FE) MG tablet Take 325 mg by mouth daily with breakfast.   Yes Historical Provider, MD  Fluticasone-Salmeterol (ADVAIR) 250-50 MCG/DOSE AEPB Inhale 1 puff into the lungs every 12 (twelve) hours.   Yes Historical Provider, MD  ipratropium (ATROVENT) 0.02 % nebulizer solution As directed 06/17/15  Yes Historical Provider, MD  memantine (NAMENDA) 10 MG tablet Take 10 mg by mouth. As needed 05/29/15  Yes Historical Provider, MD  metFORMIN (GLUCOPHAGE) 500 MG tablet Take 1,000 mg by mouth 2 (two) times daily with a meal.   Yes Historical Provider, MD  pantoprazole (PROTONIX) 40 MG tablet Take 1 tablet (40 mg total) by mouth 2 (two) times daily. 07/07/15  Yes Charlynne Cousins, MD  rosuvastatin (CRESTOR) 10 MG tablet Take 10 mg by mouth daily.   Yes Historical Provider, MD  sitaGLIPtin (JANUVIA) 50 MG tablet Take 50 mg by mouth daily.   Yes Historical Provider, MD  tamsulosin (FLOMAX) 0.4 MG CAPS Take 0.4 mg by mouth daily after breakfast.   Yes Historical Provider, MD   Physical Exam: Filed Vitals:   07/20/15 [id number] 07/20/15 0733 07/20/15 0752 07/20/15 0900  BP:   136/72   Temp: 97.7 F (36.5 C)     TempSrc: Oral     Resp:    26  SpO2:  100%      Wt Readings from Last 3 Encounters:  07/17/15 74.39 kg (164 lb)  07/05/15 69.5 kg (153 lb 3.5 oz)  09/18/14 79.833 kg (176 lb)    General:  Appears calm and comfortable, thin and frail Eyes: PERRL, normal lids, irises & conjunctiva ENT: grossly normal hearing, lips & tongue weakness membranes of his mouth are pale but moist Neck: no LAD, masses or thyromegaly Cardiovascular: Sounds are distant I hear no murmur gallop or rub 3+ lower extremity edema  Respiratory: Mild increased work of breathing with conversation. He is able to complete sentences.  Breath sounds are quite diminished throughout. There are scattered rhonchi and faint end-expiratory wheezing Abdomen: soft, ntnd Skin: no rash or induration seen on limited exam Musculoskeletal: grossly normal tone BUE/BLE Psychiatric: grossly normal mood and affect, speech fluent and appropriate Neurologic: grossly non-focal. Speech clear facial symmetry           Labs on Admission:  Basic Metabolic Panel:  Recent Labs Lab 07/20/15 0802  NA 136  K 3.9  CL 101  CO2 22  GLUCOSE 183*  BUN 25*  CREATININE 0.61  CALCIUM 8.3*   Liver Function Tests:  Recent Labs Lab 07/20/15 0802  AST 29  ALT 12*  ALKPHOS 680*  BILITOT 1.3*  PROT 5.5*  ALBUMIN 2.4*   No results for input(s): LIPASE, AMYLASE in the last 168 hours. No results for input(s): AMMONIA in the last 168 hours. CBC:  Recent Labs Lab 07/17/15 1119 07/20/15 0802  WBC 4.9 4.4  NEUTROABS 4.1 3.8  HGB 7.3* 6.3*  HCT 22.9* 20.0*  MCV 86.4 85.1  PLT 86* 80*   Cardiac Enzymes: No results for input(s): CKTOTAL, CKMB, CKMBINDEX, TROPONINI in the last 168 hours.  BNP (last 3 results)  Recent Labs  03/13/15 1727 07/04/15 2214 07/20/15 0802  BNP 92.4 132.3* 187.9*    ProBNP (last 3 results) No results for input(s): PROBNP in the last 8760 hours.  CBG: No results for input(s): GLUCAP in the last 168 hours.  Radiological Exams on Admission: Dg Chest 2 View  07/20/2015  CLINICAL DATA:  Worsening shortness of breath this morning, chronic cough with history of asthma, former smoker ; repeat PA view to exclude pneumothorax EXAM: CHEST  2 VIEW COMPARISON:  07/20/2015 FINDINGS: Expiratory PA radiograph confirms the absence of pneumothorax. Findings otherwise unchanged with mild left base atelectasis. IMPRESSION: No pneumothorax. Electronically Signed   By: Skipper Cliche M.D.   On: 07/20/2015 09:32   Dg Chest Port 1 View  07/20/2015  CLINICAL DATA:  Shortness of breath getting worse this morning, chronic cough  EXAM: PORTABLE CHEST 1 VIEW COMPARISON:  07/04/2015 FINDINGS: Mild elevation of the left diaphragm, stable. Mild left base atelectasis. Heart size upper normal and stable. Vascular pattern normal. Right lung is free of infiltrate or opacity. No pleural effusions. A probable skin fold over the lateral right lung simulates a pneumothorax. IMPRESSION: An apparent skin fold simulates a right pneumothorax. To confirm this, repeat PA view in expiration recommended. Study otherwise negative except for mild left base atelectasis. Electronically Signed   By: Skipper Cliche M.D.   On: 07/20/2015 08:36    EKG: Independently reviewed. Sinus tachycardia Multiple premature complexes, vent & supraven  Assessment/Plan Principal Problem:   Symptomatic anemia Active Problems:   Diabetes mellitus, type 2 (HCC)   Prostate cancer (HCC)   Gastric cancer (HCC)   Anemia   COPD with emphysema (HCC)   Bilateral leg edema  #1. Symptomatic anemia. Acute on chronic related to recurrent GI bleed secondary to gastric cancer. Recent admission for same at which time EGD done and bleeding gastric AVM. Note indicates is likely be a recurrent problem as radiation would cause neovascularization. Very recent oncology note indicates hospice care appropriate.  Hemoglobin 6.3 on admission. Chart review indicates hemoglobin 7.33 days ago -Admit to telemetry -Transfuse 1 unit packed red blood cells -Protonix twice a day -No GI consult for now -Palliative care consult  #2. Gastric cancer/advanced prostate cancer. History of adenocarcinoma of the gastric antrum. Diagnosed 2015 status post radiation. He sent EGD reveals likely recurrent of gastric cancer commending oncology follow-up  #3. COPD with emphysema. May have mild exacerbation. Chest x-ray without acute process. -Scheduled nebulizers -Continue oxygen supplementation  #4. Bilateral lower extremity edema.acute on chronic. -no echo due to poor prognosis  #5. Diabetes type  2. Serum glucose 183 on admission. -Obtain a hemoglobin A1c -Hold oral agents -Sliding scale insulin - Palliative care    Code Status: DNR DVT Prophylaxis: Family Communication: caregive at bedside son Cecilie Lowers [phone] Disposition Plan:  Home when ready  Time spent: 92 minutes  [facility]    I have examined the patient, reviewed the chart and modified the above note which I agree with. Agree with blood transfusion and palliative care consult. Will add Tussionex and Tessalon for sever cough.   Dwight Adamczak,MD Pager # [email or web address] 07/20/2015, 1:37 PM

## 2015-07-21 DIAGNOSIS — D649 Anemia, unspecified: Secondary | ICD-10-CM | POA: Diagnosis not present

## 2015-07-21 DIAGNOSIS — C169 Malignant neoplasm of stomach, unspecified: Secondary | ICD-10-CM | POA: Diagnosis not present

## 2015-07-21 DIAGNOSIS — R600 Localized edema: Secondary | ICD-10-CM | POA: Diagnosis not present

## 2015-07-21 DIAGNOSIS — J438 Other emphysema: Secondary | ICD-10-CM | POA: Diagnosis not present

## 2015-07-21 DIAGNOSIS — Z7189 Other specified counseling: Secondary | ICD-10-CM | POA: Diagnosis not present

## 2015-07-21 DIAGNOSIS — Z515 Encounter for palliative care: Secondary | ICD-10-CM | POA: Diagnosis not present

## 2015-07-21 LAB — BASIC METABOLIC PANEL
Anion gap: 10 (ref 5–15)
BUN: 22 mg/dL — ABNORMAL HIGH (ref 6–20)
CHLORIDE: 99 mmol/L — AB (ref 101–111)
CO2: 25 mmol/L (ref 22–32)
Calcium: 8 mg/dL — ABNORMAL LOW (ref 8.9–10.3)
Creatinine, Ser: 0.6 mg/dL — ABNORMAL LOW (ref 0.61–1.24)
GFR calc Af Amer: >60 mL/min (ref >60)
GLUCOSE: 151 mg/dL — AB (ref 65–99)
Potassium: 4.1 mmol/L (ref 3.5–5.1)
SODIUM: 134 mmol/L — AB (ref 135–145)

## 2015-07-21 LAB — CBC
HEMATOCRIT: 24.3 % — AB (ref 39.0–52.0)
HEMOGLOBIN: 7.7 g/dL — AB (ref 13.0–17.0)
MCH: 27 pg (ref 26.0–34.0)
MCHC: 31.7 g/dL (ref 30.0–36.0)
MCV: 85.3 fL (ref 78.0–100.0)
Platelets: 83 10*3/uL — ABNORMAL LOW (ref 150–400)
RBC: 2.85 MIL/uL — AB (ref 4.22–5.81)
RDW: 17.9 % — ABNORMAL HIGH (ref 11.5–15.5)
WBC: 3.3 10*3/uL — AB (ref 4.0–10.5)

## 2015-07-21 LAB — GLUCOSE, CAPILLARY
GLUCOSE-CAPILLARY: 137 mg/dL — AB (ref 65–99)
GLUCOSE-CAPILLARY: 147 mg/dL — AB (ref 65–99)
GLUCOSE-CAPILLARY: 138 mg/dL — AB (ref 65–99)

## 2015-07-21 LAB — PREPARE RBC (CROSSMATCH)

## 2015-07-21 MED ORDER — SODIUM CHLORIDE 0.9 % IV SOLN
Freq: Once | INTRAVENOUS | Status: AC
  Administered 2015-07-21: 04:00:00 via INTRAVENOUS

## 2015-07-21 MED ORDER — GUAIFENESIN ER 600 MG PO TB12
600.0000 mg | ORAL_TABLET | Freq: Two times a day (BID) | ORAL | Status: DC
  Administered 2015-07-21 – 2015-07-26 (×11): 600 mg via ORAL
  Filled 2015-07-21 (×12): qty 1

## 2015-07-21 NOTE — Consult Note (Signed)
Consultation Note Date: 07/21/2015   Patient Name: Melvin Gardner  DOB: 22-May-1929  MRN: [id number]  Age / Sex: 82 y.o., male  PCP: Andi Devon, MD Referring Physician: Zannie Cove, MD  Reason for Consultation: Establishing goals of care  Clinical Assessment/Narrative: 82 year old male with diabetes, COPD, advanced prostate cancer as well as gastric cancer status post radiation admitted after presenting to ED and found to have acute on chronic anemia with a hemoglobin of 6.3 and tachycardia.  I met today with Melvin Gardner, his son, and his caregiver, Annice Pih.  He reports that seeing his family is the most important thing to him, and he reports that he is hopeful his other sons arrive from out-of-town tomorrow.  His family report that his physicians have been doing a good job explaining things to him, and he seems to have good understanding of his current situation with prostate and gastric cancer that will not be improved with further disease modifying therapy.  We also discussed his recurrent GI bleed and the fact that this is not a "fixable" problem.    Contacts/Participants in Discussion: patient, his sone, caregiver, Company secretary: Patient  Relationship to Patient self HCPOA: None on chart  SUMMARY OF RECOMMENDATIONS - He is DNR/DNI.  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  This included initial discussion of residential vs home hospice. - His other family will be in town tomorrow.  Plan to meet to continue conversation at that time. - I left a copy of [facility] to review.  Code Status/Advance Care Planning: DNR    Code Status Orders        Start     Ordered   07/20/15 1029  Do not attempt resuscitation (DNR)   Continuous    Question Answer Comment  In the event of cardiac or  respiratory ARREST Do not call a "code blue"   In the event of cardiac or respiratory ARREST Do not perform Intubation, CPR, defibrillation or ACLS   In the event of cardiac or respiratory ARREST Use medication by any route, position, wound care, and other measures to relive pain and suffering. May use oxygen, suction and manual treatment of airway obstruction as needed for comfort.      07/20/15 1028    Code Status History    Date Active Date Inactive Code Status Order ID Comments User Context   07/20/2015 10:12 AM 07/20/2015 10:28 AM Full Code [id number]  Gwenyth Bender, NP ED   07/04/2015  8:36 PM 07/07/2015  5:45 PM Full Code [id number]  Meredith Pel, NP ED   03/13/2015  8:55 PM 03/16/2015  6:36 PM Full Code [id number]  Rhetta Mura, MD Inpatient   12/03/2014 10:03 AM 12/04/2014  4:19 PM Full Code [id number]  Stephani Police, PA-C Inpatient   11/23/2013  5:31 PM 11/25/2013  3:51 PM Full Code [id number]  Marinda Elk, MD Inpatient      Other Directives:None  Symptom Management:   Wheezeing, cough, and SOB: Agree with lasix, breathing treatment, and morphine prn.  Palliative Prophylaxis:   Aspiration, Bowel Regimen and Frequent Pain Assessment  Psycho-social/Spiritual:  Support System: Fair Desire for further Chaplaincy support:Did not address today Additional Recommendations: Education on Hospice  Prognosis: Unable to determine due to acuity of condition, however if bleeding does not stop and he elects to pursue comfort based care, his expected prognosis would be a  matter of days at best  Discharge Planning: to be determined   Chief Complaint/ Primary Diagnoses: Present on Admission:  . Symptomatic anemia . Anemia . Prostate cancer ([place]) . Gastric cancer ([place]) . COPD with emphysema ([place]) . Bilateral leg edema  I have reviewed the medical record, interviewed the patient and family, and examined the patient. The following aspects are pertinent.  Past Medical History    Diagnosis Date  . Asthma   . High cholesterol   . Type II diabetes mellitus ([place])   . Anemia   . Arthritis     "left knee" (11/23/2013)  . COPD (chronic obstructive pulmonary disease) ([place])     [name] 09/17/2010  . Prostate cancer ([place]) 06/03/2001    bilat involvement,adenocarcinoma,gleason 6(3+3) PSA=37.6  . Stomach cancer ([place]) 11/24/13    adenocarcinoma   . History of blood transfusion 11/23/2013    related to lower GI bleeding  . History of radiation therapy 03/28/2003-05/25/2003    Prostate  . Glaucoma   . Hx of radiation therapy 01/15/14-109/15    gastric cancer   Social History   Social History  . Marital Status: Widowed    Spouse Name: N/A  . Number of Children: N/A  . Years of Education: N/A   Social History Main Topics  . Smoking status: Former Smoker -- 1.00 packs/day for 5 years    Types: Cigarettes  . Smokeless tobacco: Never Used     Comment: "quit smoking in the 1950's"  . Alcohol Use: No     Comment: 11/23/2013 "don't drink anything now; never was a drinker" quit drugs years ago  . Drug Use: No     Comment: "stopped drugs a long long time ago"  . Sexual Activity: No   Other Topics Concern  . None   Social History Narrative   Family History  Problem Relation Age of Onset  . Diabetes Mellitus II Mother   . Other Father    Scheduled Meds: . sodium chloride  10 mL/hr Intravenous Once  . albuterol  5 mg Nebulization Once  . bicalutamide  50 mg Oral Daily  . chlorpheniramine-HYDROcodone  5 mL Oral Q12H  . ferrous sulfate  325 mg Oral Q breakfast  . guaiFENesin  600 mg Oral BID  . insulin aspart  0-15 Units Subcutaneous TID WC  . insulin aspart  0-5 Units Subcutaneous QHS  . mometasone-formoterol  2 puff Inhalation BID  . pantoprazole (PROTONIX) IV  40 mg Intravenous Q12H  . sodium chloride flush  3 mL Intravenous Q12H  . tamsulosin  0.4 mg Oral QPC breakfast   Continuous Infusions:  PRN Meds:.acetaminophen **OR** acetaminophen, albuterol, benzonatate,  HYDROcodone-acetaminophen, morphine injection, ondansetron **OR** ondansetron (ZOFRAN) IV, traZODone Medications Prior to Admission:  Prior to Admission medications   Medication Sig Start Date End Date Taking? Authorizing Provider  acetaminophen (TYLENOL) 500 MG tablet Take 500 mg by mouth every 6 (six) hours as needed for mild pain.   Yes Historical Provider, MD  albuterol (PROVENTIL HFA;VENTOLIN HFA) 108 (90 BASE) MCG/ACT inhaler Inhale 2 puffs into the lungs every 6 (six) hours as needed for wheezing.   Yes Historical Provider, MD  albuterol (PROVENTIL) (2.5 MG/3ML) 0.083% nebulizer solution Take 2.5 mg by nebulization every 6 (six) hours as needed for wheezing.   Yes Historical Provider, MD  bicalutamide (CASODEX) 50 MG tablet Take 50 mg by mouth daily. 11/16/13  Yes Historical Provider, MD  ferrous sulfate 325 (65 FE) MG tablet Take 325 mg by mouth daily  with breakfast.   Yes Historical Provider, MD  Fluticasone-Salmeterol (ADVAIR) 250-50 MCG/DOSE AEPB Inhale 1 puff into the lungs every 12 (twelve) hours.   Yes Historical Provider, MD  ipratropium (ATROVENT) 0.02 % nebulizer solution As directed 06/17/15  Yes Historical Provider, MD  memantine (NAMENDA) 10 MG tablet Take 10 mg by mouth. As needed 05/29/15  Yes Historical Provider, MD  metFORMIN (GLUCOPHAGE) 500 MG tablet Take 1,000 mg by mouth 2 (two) times daily with a meal.   Yes Historical Provider, MD  pantoprazole (PROTONIX) 40 MG tablet Take 1 tablet (40 mg total) by mouth 2 (two) times daily. 07/07/15  Yes Charlynne Cousins, MD  rosuvastatin (CRESTOR) 10 MG tablet Take 10 mg by mouth daily.   Yes Historical Provider, MD  sitaGLIPtin (JANUVIA) 50 MG tablet Take 50 mg by mouth daily.   Yes Historical Provider, MD  tamsulosin (FLOMAX) 0.4 MG CAPS Take 0.4 mg by mouth daily after breakfast.   Yes Historical Provider, MD   No Known Allergies  Review of Systems  Constitutional: Positive for activity change and fatigue.  Respiratory: Positive  for cough and wheezing.   Cardiovascular: Positive for leg swelling.  Musculoskeletal: Positive for back pain.    Physical Exam   General: Adult male, No acute distress, intermittent cough  HEENT: NCAT, MMM  Cardiovascular: RRR, nl S1, S2 no mrg, 2+ pulses,   Respiratory: Diminished bilaterally, wheeze/rales at the left base, no increased WOB  Abdomen: soft, mildly distended, nontender  MSK: Normal tone and bulk, 3+ pitting bilateral LE, warm    Neuro: Grossly intact, moves 4 extremities  Vital Signs: BP 122/64 mmHg  Pulse 100  Temp(Src) 97.2 F (36.2 C) (Oral)  Resp 20  Ht '5\' 9"'$  (1.753 m)  Wt 70.6 kg (155 lb 10.3 oz)  BMI 22.97 kg/m2  SpO2 95%  SpO2: SpO2: 95 % O2 Device:SpO2: 95 % O2 Flow Rate: .O2 Flow Rate (L/min): 2 L/min  IO: Intake/output summary:  Intake/Output Summary (Last 24 hours) at 07/21/15 2221 Last data filed at 07/21/15 2115  Gross per 24 hour  Intake   1378 ml  Output    750 ml  Net    628 ml    LBM: Last BM Date: 07/21/15 Baseline Weight: Weight: 70.8 kg (156 lb 1.4 oz) Most recent weight: Weight: 70.6 kg (155 lb 10.3 oz)      Palliative Assessment/Data:  Flowsheet Rows        Most Recent Value   Intake Tab    Referral Department  Hospitalist   Unit at Time of Referral  Med/Surg Unit   Palliative Care Primary Diagnosis  Cancer   Date Notified  07/20/15   Palliative Care Type  New Palliative care   Reason for referral  Clarify Goals of Care   Date of Admission  07/20/15   Date first seen by Palliative Care  07/21/15   # of days Palliative referral response time  1 Day(s)   # of days IP prior to Palliative referral  0   Clinical Assessment    Palliative Performance Scale Score  60%   Pain Max last 24 hours  2   Pain Min Last 24 hours  0   Psychosocial & Spiritual Assessment    Palliative Care Outcomes    Patient/Family meeting held?  Yes   Who was at the meeting?  Patient   Palliative Care Outcomes  Clarified goals of  care      Additional Data Reviewed:  CBC:  Component Value Date/Time   WBC 3.3* 07/21/2015 1200   WBC 4.9 07/17/2015 1119   HGB 7.7* 07/21/2015 1200   HGB 7.3* 07/17/2015 1119   HCT 24.3* 07/21/2015 1200   HCT 22.9* 07/17/2015 1119   PLT 83* 07/21/2015 1200   PLT 86* 07/17/2015 1119   MCV 85.3 07/21/2015 1200   MCV 86.4 07/17/2015 1119   NEUTROABS 3.8 07/20/2015 0802   NEUTROABS 4.1 07/17/2015 1119   LYMPHSABS 0.3* 07/20/2015 0802   LYMPHSABS 0.3* 07/17/2015 1119   MONOABS 0.4 07/20/2015 0802   MONOABS 0.5 07/17/2015 1119   EOSABS 0.0 07/20/2015 0802   EOSABS 0.0 07/17/2015 1119   BASOSABS 0.0 07/20/2015 0802   BASOSABS 0.0 07/17/2015 1119   Comprehensive Metabolic Panel:    Component Value Date/Time   NA 134* 07/21/2015 1200   NA 140 08/28/2014 0903   K 4.1 07/21/2015 1200   K 4.1 08/28/2014 0903   CL 99* 07/21/2015 1200   CO2 25 07/21/2015 1200   CO2 22 08/28/2014 0903   BUN 22* 07/21/2015 1200   BUN 10.9 08/28/2014 0903   CREATININE 0.60* 07/21/2015 1200   CREATININE 0.7 08/28/2014 0903   GLUCOSE 151* 07/21/2015 1200   GLUCOSE 156* 08/28/2014 0903   CALCIUM 8.0* 07/21/2015 1200   CALCIUM 9.0 08/28/2014 0903   AST 29 07/20/2015 0802   AST 11 08/28/2014 0903   ALT 12* 07/20/2015 0802   ALT 8 08/28/2014 0903   ALKPHOS 680* 07/20/2015 0802   ALKPHOS 198* 08/28/2014 0903   BILITOT 1.3* 07/20/2015 0802   BILITOT 0.39 08/28/2014 0903   PROT 5.5* 07/20/2015 0802   PROT 6.7 08/28/2014 0903   ALBUMIN 2.4* 07/20/2015 0802   ALBUMIN 3.7 08/28/2014 0903     Time In: 1350 Time Out: 1510 Time Total: 80 Greater than 50%  of this time was spent counseling and coordinating care related to the above assessment and plan.  Signed by: Micheline Rough, MD  Micheline Rough, MD  07/21/2015, 10:21 PM  Please contact Palliative Medicine Team phone at [phone] for questions and concerns.

## 2015-07-21 NOTE — Progress Notes (Signed)
[facility] PROGRESS NOTE  Melvin Gardner [id number] DOB: Sep 29, 1929 DOA: 07/20/2015 PCP: [name]., MD  Assessment/Plan: #1. Symptomatic anemia.  -Acute on chronic related to recurrent GI bleed secondary to gastric cancer, last EGD 2 weeks ago with recurrence of gastric adenoCA s/p APC -Hemoglobin 6.3 on admission, s/p 2 units PRBC repeat this am pending -last Onc noted 3/1 notes not a candidate for active Rx and recommends hospice if declines -palliative consult requested, pt well aware of poor prognosis -continue IV PPI  #2. Gastric cancer/advanced prostate cancer. History of adenocarcinoma of the gastric antrum. Diagnosed 2015 status post radiation.  -Recent  EGD revealed recurrence of gastric cancer, per Onc Dr.Shadad , he is not a candidate for any aggressive therapy or  surgical resection and no further radiation can be given. It was felt that systemic chemotherapy will offer very little benefit and he is a poor candidate for it  #3. COPD with emphysema/chronic resp failure. -no wheezing noted, Chest x-ray without acute process. -Scheduled nebulizers -Continue oxygen supplementation  #4. Edema/Anasarca -no echo due to poor prognosis Could be in part from hypoalbuminemia and third spacing  #5. Diabetes type 2. Serum glucose 183 on admission. -Hold oral agents -Sliding scale insulin  #6: severe protein calorie malnutrition  Code Status: DNR DVT Prophylaxis: SCDs Family Communication: none at bedside Disposition Plan:pending pall eval    Consultants:  Palliative  HPI/Subjective: Feels weak, cough+  Objective: Filed Vitals:   07/21/15 0548 07/21/15 0806  BP: 107/48 115/59  Pulse: 96 104  Temp: 98.6 F (37 C) 97.7 F (36.5 C)  Resp: 18 20    Intake/Output Summary (Last 24 hours) at 07/21/15 1054 Last data filed at 07/21/15 1026  Gross per 24 hour  Intake   1943 ml  Output    450 ml  Net   1493 ml   Filed Weights   07/20/15 1110  07/20/15 2028  Weight: 70.8 kg (156 lb 1.4 oz) 70.6 kg (155 lb 10.3 oz)    Exam:   General:  AAOx3, frail, cachectic male  Cardiovascular: S1S2/RRR  Respiratory: Ronchi at bases  Abdomen: soft, Nt, BS present  Musculoskeletal: 2 plus edema    Data Reviewed: Basic Metabolic Panel:  Recent Labs Lab 07/20/15 0802  NA 136  K 3.9  CL 101  CO2 22  GLUCOSE 183*  BUN 25*  CREATININE 0.61  CALCIUM 8.3*   Liver Function Tests:  Recent Labs Lab 07/20/15 0802  AST 29  ALT 12*  ALKPHOS 680*  BILITOT 1.3*  PROT 5.5*  ALBUMIN 2.4*   No results for input(s): LIPASE, AMYLASE in the last 168 hours. No results for input(s): AMMONIA in the last 168 hours. CBC:  Recent Labs Lab 07/17/15 1119 07/20/15 0802 07/20/15 1633 07/20/15 1838  WBC 4.9 4.4 3.4* 3.4*  NEUTROABS 4.1 3.8  --   --   HGB 7.3* 6.3* 6.9* 6.6*  HCT 22.9* 20.0* 21.6* 20.5*  MCV 86.4 85.1 85.0 84.4  PLT 86* 80* 73* 70*   Cardiac Enzymes: No results for input(s): CKTOTAL, CKMB, CKMBINDEX, TROPONINI in the last 168 hours. BNP (last 3 results)  Recent Labs  03/13/15 1727 07/04/15 2214 07/20/15 0802  BNP 92.4 132.3* 187.9*    ProBNP (last 3 results) No results for input(s): PROBNP in the last 8760 hours.  CBG:  Recent Labs Lab 07/20/15 1153 07/20/15 1729 07/20/15 2026 07/21/15 0741  GLUCAP 157* 111* 134* 137*    Recent Results (from the past 240 hour(s))  TECHNOLOGIST REVIEW     Status: None   Collection Time: 07/17/15 11:19 AM  Result Value Ref Range Status   Technologist Review   Final    Sl poly, Few teardrop, ovalos, schistocytes, target, and burr cells, occ helmet and acanthocyte     Studies: Dg Chest 2 View  07/20/2015  CLINICAL DATA:  Worsening shortness of breath this morning, chronic cough with history of asthma, former smoker ; repeat PA view to exclude pneumothorax EXAM: CHEST  2 VIEW COMPARISON:  07/20/2015 FINDINGS: Expiratory PA radiograph confirms the absence of  pneumothorax. Findings otherwise unchanged with mild left base atelectasis. IMPRESSION: No pneumothorax. Electronically Signed   By: Skipper Cliche M.D.   On: 07/20/2015 09:32   Dg Chest Port 1 View  07/20/2015  CLINICAL DATA:  Shortness of breath getting worse this morning, chronic cough EXAM: PORTABLE CHEST 1 VIEW COMPARISON:  07/04/2015 FINDINGS: Mild elevation of the left diaphragm, stable. Mild left base atelectasis. Heart size upper normal and stable. Vascular pattern normal. Right lung is free of infiltrate or opacity. No pleural effusions. A probable skin fold over the lateral right lung simulates a pneumothorax. IMPRESSION: An apparent skin fold simulates a right pneumothorax. To confirm this, repeat PA view in expiration recommended. Study otherwise negative except for mild left base atelectasis. Electronically Signed   By: Skipper Cliche M.D.   On: 07/20/2015 08:36    Scheduled Meds: . sodium chloride  10 mL/hr Intravenous Once  . albuterol  5 mg Nebulization Once  . bicalutamide  50 mg Oral Daily  . chlorpheniramine-HYDROcodone  5 mL Oral Q12H  . ferrous sulfate  325 mg Oral Q breakfast  . insulin aspart  0-15 Units Subcutaneous TID WC  . insulin aspart  0-5 Units Subcutaneous QHS  . mometasone-formoterol  2 puff Inhalation BID  . pantoprazole (PROTONIX) IV  40 mg Intravenous Q12H  . sodium chloride flush  3 mL Intravenous Q12H  . tamsulosin  0.4 mg Oral QPC breakfast   Continuous Infusions:  Antibiotics Given (last 72 hours)    None      Principal Problem:   Symptomatic anemia Active Problems:   Diabetes mellitus, type 2 (HCC)   Prostate cancer (HCC)   Gastric cancer (HCC)   Anemia   COPD with emphysema (HCC)   Bilateral leg edema    Time spent: 2min    Gorman Safi  [facility] Pager [phone]. If 7PM-7AM, please contact night-coverage at [email or web address], password [facility] 07/21/2015, 10:54 AM  LOS: 1 day

## 2015-07-22 DIAGNOSIS — D649 Anemia, unspecified: Secondary | ICD-10-CM | POA: Diagnosis not present

## 2015-07-22 DIAGNOSIS — C162 Malignant neoplasm of body of stomach: Secondary | ICD-10-CM | POA: Diagnosis not present

## 2015-07-22 DIAGNOSIS — E7800 Pure hypercholesterolemia, unspecified: Secondary | ICD-10-CM | POA: Diagnosis present

## 2015-07-22 DIAGNOSIS — C61 Malignant neoplasm of prostate: Secondary | ICD-10-CM | POA: Diagnosis not present

## 2015-07-22 DIAGNOSIS — Z515 Encounter for palliative care: Secondary | ICD-10-CM | POA: Diagnosis not present

## 2015-07-22 DIAGNOSIS — E118 Type 2 diabetes mellitus with unspecified complications: Secondary | ICD-10-CM | POA: Diagnosis not present

## 2015-07-22 DIAGNOSIS — J9610 Chronic respiratory failure, unspecified whether with hypoxia or hypercapnia: Secondary | ICD-10-CM | POA: Diagnosis present

## 2015-07-22 DIAGNOSIS — D630 Anemia in neoplastic disease: Secondary | ICD-10-CM | POA: Diagnosis present

## 2015-07-22 DIAGNOSIS — J438 Other emphysema: Secondary | ICD-10-CM | POA: Diagnosis not present

## 2015-07-22 DIAGNOSIS — Z833 Family history of diabetes mellitus: Secondary | ICD-10-CM | POA: Diagnosis not present

## 2015-07-22 DIAGNOSIS — R0602 Shortness of breath: Secondary | ICD-10-CM | POA: Diagnosis present

## 2015-07-22 DIAGNOSIS — Z923 Personal history of irradiation: Secondary | ICD-10-CM | POA: Diagnosis not present

## 2015-07-22 DIAGNOSIS — E43 Unspecified severe protein-calorie malnutrition: Secondary | ICD-10-CM | POA: Diagnosis present

## 2015-07-22 DIAGNOSIS — K922 Gastrointestinal hemorrhage, unspecified: Secondary | ICD-10-CM | POA: Diagnosis present

## 2015-07-22 DIAGNOSIS — J441 Chronic obstructive pulmonary disease with (acute) exacerbation: Secondary | ICD-10-CM | POA: Diagnosis present

## 2015-07-22 DIAGNOSIS — Z66 Do not resuscitate: Secondary | ICD-10-CM | POA: Diagnosis present

## 2015-07-22 DIAGNOSIS — D696 Thrombocytopenia, unspecified: Secondary | ICD-10-CM | POA: Diagnosis present

## 2015-07-22 DIAGNOSIS — C169 Malignant neoplasm of stomach, unspecified: Secondary | ICD-10-CM | POA: Diagnosis not present

## 2015-07-22 DIAGNOSIS — Z87891 Personal history of nicotine dependence: Secondary | ICD-10-CM | POA: Diagnosis not present

## 2015-07-22 DIAGNOSIS — Z7189 Other specified counseling: Secondary | ICD-10-CM

## 2015-07-22 DIAGNOSIS — R600 Localized edema: Secondary | ICD-10-CM | POA: Diagnosis not present

## 2015-07-22 DIAGNOSIS — C163 Malignant neoplasm of pyloric antrum: Secondary | ICD-10-CM | POA: Diagnosis present

## 2015-07-22 DIAGNOSIS — Z7984 Long term (current) use of oral hypoglycemic drugs: Secondary | ICD-10-CM | POA: Diagnosis not present

## 2015-07-22 DIAGNOSIS — E119 Type 2 diabetes mellitus without complications: Secondary | ICD-10-CM | POA: Diagnosis present

## 2015-07-22 LAB — BASIC METABOLIC PANEL
Anion gap: 10 (ref 5–15)
BUN: 17 mg/dL (ref 6–20)
CALCIUM: 7.9 mg/dL — AB (ref 8.9–10.3)
CO2: 25 mmol/L (ref 22–32)
CREATININE: 0.61 mg/dL (ref 0.61–1.24)
Chloride: 102 mmol/L (ref 101–111)
GFR calc Af Amer: >60 mL/min (ref >60)
GLUCOSE: 121 mg/dL — AB (ref 65–99)
Potassium: 4 mmol/L (ref 3.5–5.1)
SODIUM: 137 mmol/L (ref 135–145)

## 2015-07-22 LAB — PREPARE RBC (CROSSMATCH)

## 2015-07-22 LAB — CBC
HCT: 23.3 % — ABNORMAL LOW (ref 39.0–52.0)
Hemoglobin: 7.4 g/dL — ABNORMAL LOW (ref 13.0–17.0)
MCH: 27.2 pg (ref 26.0–34.0)
MCHC: 31.8 g/dL (ref 30.0–36.0)
MCV: 85.7 fL (ref 78.0–100.0)
PLATELETS: 72 10*3/uL — AB (ref 150–400)
RBC: 2.72 MIL/uL — ABNORMAL LOW (ref 4.22–5.81)
RDW: 17.8 % — AB (ref 11.5–15.5)
WBC: 2.9 10*3/uL — AB (ref 4.0–10.5)

## 2015-07-22 LAB — GLUCOSE, CAPILLARY
GLUCOSE-CAPILLARY: 95 mg/dL (ref 65–99)
GLUCOSE-CAPILLARY: 108 mg/dL — AB (ref 65–99)
Glucose-Capillary: 120 mg/dL — ABNORMAL HIGH (ref 65–99)

## 2015-07-22 LAB — HEMOGLOBIN A1C
Hgb A1c MFr Bld: 5.9 % — ABNORMAL HIGH (ref 4.8–5.6)
Mean Plasma Glucose: 123 mg/dL

## 2015-07-22 MED ORDER — SODIUM CHLORIDE 0.9 % IV SOLN: Freq: Once | INTRAVENOUS | Status: DC

## 2015-07-22 MED ORDER — MORPHINE SULFATE (CONCENTRATE) 10 MG/0.5ML PO SOLN: 5.0000 mg | ORAL | Status: DC | PRN

## 2015-07-22 MED ORDER — FUROSEMIDE 10 MG/ML IJ SOLN
40.0000 mg | Freq: Two times a day (BID) | INTRAMUSCULAR | Status: DC
  Administered 2015-07-22 – 2015-07-26 (×9): 40 mg via INTRAVENOUS
  Filled 2015-07-22 (×9): qty 4

## 2015-07-22 NOTE — Progress Notes (Signed)
[facility] PROGRESS NOTE  Melvin Gardner [id number] DOB: [birth year] DOA: 07/20/2015 PCP: [name]., MD  Brief Summary  The patient is an 82 year old male with history of prostate cancer and recurrent gastric cancer which has caused him to have recurrent GI bleed requiring hospitalization and blood transfusion.  He was admitted with symptomatic anemia, not responding to blood transfusion.  DNR and discussing arrangements for post-hospitalization care under hospice.  Home vs. Residential hospice likely.  Palliative care consulted.  Assessment/Plan  Symptomatic anemia due to recurrent GIB from recurrent gastric cancer. Hgb went from 6.6 to 7.7mg /dl with 2 units of blood but trending down today again -  Transfuse 1 unit PRBC -  Discussed hospice care with patient and son -  Dr. Domingo Cocking, palliative care also assisting -  Continue PPI  #2. Gastric cancer/advanced prostate cancer. History of adenocarcinoma of the gastric antrum. Diagnosed 2015 status post radiation.  -Recent EGD revealed recurrence of gastric cancer, per Onc Dr.Shadad, he is not a candidate for any aggressive therapy or surgical resection and no further radiation can be given. It was felt that systemic chemotherapy will offer very little benefit and he is a poor candidate for it  #3. COPD with emphysema/chronic resp failure, wheezing and frequent cough today -Scheduled nebulizers -Continue oxygen supplementation - Start lasix 40mg  IV BID - Start morphine for symptom management  #4. Edema/Anasarca -no echo due to poor prognosis Could be in part from hypoalbuminemia and third spacing  #5. Diabetes type 2. Serum glucose 183 on admission.  Last A1c 5.9 - Hold oral agents and continue to hold at discharge - d/c SSI  Severe protein calorie malnutrition -  Regular diet  Thrombocytopenia and leukopenia, likely due to blood loss and malignancy -  trending down gradually  Diet:  regular Access:   PIV IVF:  off Proph:  SCDs  Code Status: DNR Family Communication: patient and his son from [place] Disposition Plan: either to home with hospice or to residential hospice.  Working on cough/SOB today and giving additional blood transfusion, but possible discharge tomorrow.  Dr. Domingo Cocking going to meet with patient and son this afternoon to help clarify a discharge plan.     Consultants:  Palliative care  Procedures:  none  Antibiotics:  none   HPI/Subjective:  Cough, wheezy, SOB.  Legs are still very swollen.  Denies abdominal pain or obvious bleeding in last 24 hours.      Objective: Filed Vitals:   07/22/15 0835 07/22/15 0903 07/22/15 0908 07/22/15 1208  BP: 121/71 107/65  112/61  Pulse: 104 103  106  Temp: 97.9 F (36.6 C) 97.9 F (36.6 C)  98 F (36.7 C)  TempSrc: Oral Oral  Oral  Resp: 20 20  20   Height:      Weight:      SpO2: 100% 100% 98% 100%    Intake/Output Summary (Last 24 hours) at 07/22/15 1255 Last data filed at 07/22/15 1208  Gross per 24 hour  Intake   1178 ml  Output    850 ml  Net    328 ml   Filed Weights   07/20/15 1110 07/20/15 2028 07/21/15 2033  Weight: 70.8 kg (156 lb 1.4 oz) 70.6 kg (155 lb 10.3 oz) 71.2 kg (156 lb 15.5 oz)   Body mass index is 23.17 kg/(m^2).  Exam:   General:  Adult male, No acute distress, frequent cough  HEENT:  NCAT, MMM  Cardiovascular:  RRR, nl S1, S2 no mrg, 2+ pulses, warm  extremities  Respiratory:  Diminished bilateral BS, faint wheeze and some rales at the left base, no increased WOB  Abdomen:   NABS, soft, mildly distended, nontender  MSK:   Normal tone and bulk, 3+ pitting bilateral LEE  Neuro:  Grossly moves all extremities  Data Reviewed: Basic Metabolic Panel:  Recent Labs Lab 07/20/15 0802 07/21/15 1200 07/22/15 0344  NA 136 134* 137  K 3.9 4.1 4.0  CL 101 99* 102  CO2 22 25 25   GLUCOSE 183* 151* 121*  BUN 25* 22* 17  CREATININE 0.61 0.60* 0.61  CALCIUM 8.3* 8.0* 7.9*   Liver  Function Tests:  Recent Labs Lab 07/20/15 0802  AST 29  ALT 12*  ALKPHOS 680*  BILITOT 1.3*  PROT 5.5*  ALBUMIN 2.4*   No results for input(s): LIPASE, AMYLASE in the last 168 hours. No results for input(s): AMMONIA in the last 168 hours. CBC:  Recent Labs Lab 07/17/15 1119 07/20/15 0802 07/20/15 1633 07/20/15 1838 07/21/15 1200 07/22/15 0344  WBC 4.9 4.4 3.4* 3.4* 3.3* 2.9*  NEUTROABS 4.1 3.8  --   --   --   --   HGB 7.3* 6.3* 6.9* 6.6* 7.7* 7.4*  HCT 22.9* 20.0* 21.6* 20.5* 24.3* 23.3*  MCV 86.4 85.1 85.0 84.4 85.3 85.7  PLT 86* 80* 73* 70* 83* 72*    Recent Results (from the past 240 hour(s))  TECHNOLOGIST REVIEW     Status: None   Collection Time: 07/17/15 11:19 AM  Result Value Ref Range Status   Technologist Review   Final    Sl poly, Few teardrop, ovalos, schistocytes, target, and burr cells, occ helmet and acanthocyte     Studies: No results found.  Scheduled Meds: . sodium chloride  10 mL/hr Intravenous Once  . sodium chloride   Intravenous Once  . albuterol  5 mg Nebulization Once  . bicalutamide  50 mg Oral Daily  . chlorpheniramine-HYDROcodone  5 mL Oral Q12H  . ferrous sulfate  325 mg Oral Q breakfast  . furosemide  40 mg Intravenous BID  . guaiFENesin  600 mg Oral BID  . mometasone-formoterol  2 puff Inhalation BID  . pantoprazole (PROTONIX) IV  40 mg Intravenous Q12H  . sodium chloride flush  3 mL Intravenous Q12H  . tamsulosin  0.4 mg Oral QPC breakfast   Continuous Infusions:   Principal Problem:   Symptomatic anemia Active Problems:   Diabetes mellitus, type 2 (HCC)   Prostate cancer (HCC)   Gastric cancer (HCC)   Anemia   COPD with emphysema (HCC)   Bilateral leg edema    Time spent: 30 min    Keiarah Orlowski, [facility] Pager [phone]. If 7PM-7AM, please contact night-coverage at [email or web address], password [facility] 07/22/2015, 12:55 PM  LOS: 2 days

## 2015-07-22 NOTE — Care Management Obs Status (Signed)
[facility] NOTIFICATION   Patient Details  Name: ZAELEN HABERLAND MRN: [id number] Date of Birth: 11/09/1929   Medicare Observation Status Notification Given:  Yes    Welles Walthall, Rory Percy, RN 07/22/2015, 12:15 PM

## 2015-07-22 NOTE — Progress Notes (Signed)
0400hrs pt was  Found awake, sitting on his bed and trying to stand up.Staff went to pt room because of the alarm and asked pt to go back to bed but pt refused. Pt was assessed and found to be confused and was brought to nurse station for safety. Pt stayed in the nursing station  and continued to interact with staff until the end of the shift.

## 2015-07-23 ENCOUNTER — Ambulatory Visit: Payer: Medicare Other

## 2015-07-23 LAB — TYPE AND SCREEN
ABO/RH(D): O POS
ANTIBODY SCREEN: NEGATIVE
UNIT DIVISION: 0
UNIT DIVISION: 0
Unit division: 0

## 2015-07-23 LAB — BASIC METABOLIC PANEL
ANION GAP: 10 (ref 5–15)
BUN: 13 mg/dL (ref 6–20)
CALCIUM: 7.5 mg/dL — AB (ref 8.9–10.3)
CO2: 26 mmol/L (ref 22–32)
Chloride: 99 mmol/L — ABNORMAL LOW (ref 101–111)
Creatinine, Ser: 0.58 mg/dL — ABNORMAL LOW (ref 0.61–1.24)
GLUCOSE: 132 mg/dL — AB (ref 65–99)
Potassium: 3.6 mmol/L (ref 3.5–5.1)
Sodium: 135 mmol/L (ref 135–145)

## 2015-07-23 LAB — CBC
HCT: 25.2 % — ABNORMAL LOW (ref 39.0–52.0)
HEMATOCRIT: 24.1 % — AB (ref 39.0–52.0)
HEMOGLOBIN: 8.5 g/dL — AB (ref 13.0–17.0)
HEMOGLOBIN: 8.2 g/dL — AB (ref 13.0–17.0)
MCH: 28.4 pg (ref 26.0–34.0)
MCH: 28.6 pg (ref 26.0–34.0)
MCHC: 33.7 g/dL (ref 30.0–36.0)
MCHC: 34 g/dL (ref 30.0–36.0)
MCV: 84.3 fL (ref 78.0–100.0)
MCV: 84 fL (ref 78.0–100.0)
PLATELETS: 67 10*3/uL — AB (ref 150–400)
Platelets: 62 10*3/uL — ABNORMAL LOW (ref 150–400)
RBC: 2.99 MIL/uL — AB (ref 4.22–5.81)
RBC: 2.87 MIL/uL — ABNORMAL LOW (ref 4.22–5.81)
RDW: 17.9 % — ABNORMAL HIGH (ref 11.5–15.5)
RDW: 17.6 % — ABNORMAL HIGH (ref 11.5–15.5)
WBC: 3.4 10*3/uL — ABNORMAL LOW (ref 4.0–10.5)
WBC: 3.4 10*3/uL — AB (ref 4.0–10.5)

## 2015-07-23 LAB — GLUCOSE, CAPILLARY: Glucose-Capillary: 135 mg/dL — ABNORMAL HIGH (ref 65–99)

## 2015-07-23 MED ORDER — INSULIN ASPART 100 UNIT/ML ~~LOC~~ SOLN
0.0000 [IU] | Freq: Three times a day (TID) | SUBCUTANEOUS | Status: DC
  Administered 2015-07-24 – 2015-07-26 (×5): 1 [IU] via SUBCUTANEOUS
  Administered 2015-07-26: 2 [IU] via SUBCUTANEOUS

## 2015-07-23 NOTE — Progress Notes (Signed)
[facility] PROGRESS NOTE  Melvin Gardner [id number] DOB: 03/02/1930 DOA: 07/20/2015 PCP: [name]., MD  Brief Summary  The patient is an 82 year old male with history of prostate cancer and recurrent gastric cancer which has caused him to have recurrent GI bleed requiring hospitalization and blood transfusion. He was admitted with symptomatic anemia, not responding to blood transfusion. DNR and discussing arrangements for post-hospitalization care under hospice. Home vs. Residential hospice likely. Palliative care consulted.  Assessment/Plan: Principal Problem:   Symptomatic anemia - continue to monitor cbc - pt is s/p transfusions. Palliative care team has been consulted and if patient continues to bleed plan will be to transition to residential hospice  Active Problems:   Diabetes mellitus, type 2 ([place]) - We'll place on sliding scale insulin   Prostate cancer ([place])   Gastric cancer ([place]) - Most likely cause of principal problem   COPD with emphysema ([place]) - Currently compensated, continue albuterol as needed   Bilateral leg edema  Code Status: DO NOT RESUSCITATE Family Communication: Discussed with patient and family Disposition Plan: Pending hospital course. Palliative on board and assisting with disposition plans   Consultants:  Palliative care  Procedures:  None  Antibiotics:  None  HPI/Subjective: Patient has no new complaints. Is in good spirits.  Objective: Filed Vitals:   07/23/15 0927 07/23/15 1700  BP: 116/70 107/59  Pulse: 108 113  Temp: 98 F (36.7 C) 98.3 F (36.8 C)  Resp: 20 20    Intake/Output Summary (Last 24 hours) at 07/23/15 1836 Last data filed at 07/23/15 0900  Gross per 24 hour  Intake    240 ml  Output    225 ml  Net     15 ml   Filed Weights   07/20/15 1110 07/20/15 2028 07/21/15 2033  Weight: 70.8 kg (156 lb 1.4 oz) 70.6 kg (155 lb 10.3 oz) 71.2 kg (156 lb 15.5 oz)    Exam:   General: Patient is  alert and awake, in no acute distress. Pleasant gentleman  Cardiovascular: S1 and S2 within normal limits  Respiratory: No increased work of breathing, no wheezes  Abdomen: Soft, no guarding, nondistended  Musculoskeletal: No cyanosis  Data Reviewed: Basic Metabolic Panel:  Recent Labs Lab 07/20/15 0802 07/21/15 1200 07/22/15 0344 07/23/15 0611  NA 136 134* 137 135  K 3.9 4.1 4.0 3.6  CL 101 99* 102 99*  CO2 22 25 25 26   GLUCOSE 183* 151* 121* 132*  BUN 25* 22* 17 13  CREATININE 0.61 0.60* 0.61 0.58*  CALCIUM 8.3* 8.0* 7.9* 7.5*   Liver Function Tests:  Recent Labs Lab 07/20/15 0802  AST 29  ALT 12*  ALKPHOS 680*  BILITOT 1.3*  PROT 5.5*  ALBUMIN 2.4*   No results for input(s): LIPASE, AMYLASE in the last 168 hours. No results for input(s): AMMONIA in the last 168 hours. CBC:  Recent Labs Lab 07/17/15 1119  07/20/15 0802 07/20/15 1633 07/20/15 1838 07/21/15 1200 07/22/15 0344 07/23/15 0611  WBC 4.9  < > 4.4 3.4* 3.4* 3.3* 2.9* 3.4*  NEUTROABS 4.1  --  3.8  --   --   --   --   --   HGB 7.3*  < > 6.3* 6.9* 6.6* 7.7* 7.4* 8.5*  HCT 22.9*  < > 20.0* 21.6* 20.5* 24.3* 23.3* 25.2*  MCV 86.4  < > 85.1 85.0 84.4 85.3 85.7 84.3  PLT 86*  < > 80* 73* 70* 83* 72* 67*  < > = values in this  interval not displayed. Cardiac Enzymes: No results for input(s): CKTOTAL, CKMB, CKMBINDEX, TROPONINI in the last 168 hours. BNP (last 3 results)  Recent Labs  03/13/15 1727 07/04/15 2214 07/20/15 0802  BNP 92.4 132.3* 187.9*    ProBNP (last 3 results) No results for input(s): PROBNP in the last 8760 hours.  CBG:  Recent Labs Lab 07/21/15 1123 07/21/15 1627 07/22/15 0136 07/22/15 0826 07/22/15 1222  GLUCAP 147* 138* 95 108* 120*    Recent Results (from the past 240 hour(s))  TECHNOLOGIST REVIEW     Status: None   Collection Time: 07/17/15 11:19 AM  Result Value Ref Range Status   Technologist Review   Final    Sl poly, Few teardrop, ovalos,  schistocytes, target, and burr cells, occ helmet and acanthocyte     Studies: No results found.  Scheduled Meds: . sodium chloride  10 mL/hr Intravenous Once  . sodium chloride   Intravenous Once  . albuterol  5 mg Nebulization Once  . bicalutamide  50 mg Oral Daily  . chlorpheniramine-HYDROcodone  5 mL Oral Q12H  . ferrous sulfate  325 mg Oral Q breakfast  . furosemide  40 mg Intravenous BID  . guaiFENesin  600 mg Oral BID  . mometasone-formoterol  2 puff Inhalation BID  . pantoprazole (PROTONIX) IV  40 mg Intravenous Q12H  . sodium chloride flush  3 mL Intravenous Q12H  . tamsulosin  0.4 mg Oral QPC breakfast   Continuous Infusions:   Time spent: > 35 minutes   Velvet Bathe  [facility] Pager [phone]. If 7PM-7AM, please contact night-coverage at [email or web address], password [facility] 07/23/2015, 6:36 PM  LOS: 3 days

## 2015-07-23 NOTE — Progress Notes (Signed)
Daily Progress Note   Patient Name: Melvin Gardner       Date: 07/23/2015 DOB: 1929-06-09  Age: 82 y.o. MRN#: [id number] Attending Physician: Velvet Bathe, MD Primary Care Physician: [name]., MD Admit Date: 07/20/2015  Reason for Consultation/Follow-up: Establishing goals of care  Subjective: I met today with Melvin Gardner, 2 of his sons, and his grandson.  We discussed his clinical course this point in time as well his symptomatic anemia from recurrent GI bleed secondary to gastric cancer.  He reports again today the most important thing to him is spending time with his family.  His family seems to have good understanding of his disease and the fact that he continues to have GI bleed.  Length of Stay: 3 days  Current Medications: Scheduled Meds:  . sodium chloride  10 mL/hr Intravenous Once  . sodium chloride   Intravenous Once  . albuterol  5 mg Nebulization Once  . bicalutamide  50 mg Oral Daily  . chlorpheniramine-HYDROcodone  5 mL Oral Q12H  . ferrous sulfate  325 mg Oral Q breakfast  . furosemide  40 mg Intravenous BID  . guaiFENesin  600 mg Oral BID  . mometasone-formoterol  2 puff Inhalation BID  . pantoprazole (PROTONIX) IV  40 mg Intravenous Q12H  . sodium chloride flush  3 mL Intravenous Q12H  . tamsulosin  0.4 mg Oral QPC breakfast    Continuous Infusions:    PRN Meds: acetaminophen **OR** acetaminophen, albuterol, benzonatate, HYDROcodone-acetaminophen, morphine injection, morphine CONCENTRATE, ondansetron **OR** ondansetron (ZOFRAN) IV, traZODone  Physical Exam: Physical Exam       General: Adult male, No acute distress, intermittent cough  HEENT: NCAT, MMM  Cardiovascular: RRR, nl S1, S2 no mrg, 2+ pulses,   Respiratory: Diminished  bilaterally, wheeze/rales at the left base, no increased WOB  Abdomen: soft, mildly distended, nontender  MSK: Normal tone and bulk, 3+ pitting bilateral LE, warm  Neuro: Grossly intact, moves 4 extremities         Vital Signs: BP 116/70 mmHg  Pulse 108  Temp(Src) 98 F (36.7 C) (Oral)  Resp 20  Ht _0  (1.753 m)  Wt 71.2 kg (156 lb 15.5 oz)  BMI 23.17 kg/m2  SpO2 100% SpO2: SpO2: 100 % O2 Device: O2 Device: Not Delivered O2 Flow Rate: O2 Flow  Rate (L/min): 2 L/min  Intake/output summary:  Intake/Output Summary (Last 24 hours) at 07/23/15 1712 Last data filed at 07/23/15 0900  Gross per 24 hour  Intake    240 ml  Output    225 ml  Net     15 ml   LBM: Last BM Date: 07/21/15 Baseline Weight: Weight: 70.8 kg (156 lb 1.4 oz) Most recent weight: Weight: 71.2 kg (156 lb 15.5 oz)       Palliative Assessment/Data: Flowsheet Rows        Most Recent Value   Intake Tab    Referral Department  Hospitalist   Unit at Time of Referral  Med/Surg Unit   Palliative Care Primary Diagnosis  Cancer   Date Notified  07/20/15   Palliative Care Type  New Palliative care   Reason for referral  Clarify Goals of Care, Counsel Regarding Hospice   Date of Admission  07/20/15   Date first seen by Palliative Care  07/22/15   # of days Palliative referral response time  2 Day(s)   # of days IP prior to Palliative referral  0   Clinical Assessment    Palliative Performance Scale Score  60%   Pain Max last 24 hours  2   Pain Min Last 24 hours  0   Psychosocial & Spiritual Assessment    Palliative Care Outcomes    Patient/Family meeting held?  Yes   Who was at the meeting?  Patient   Palliative Care Outcomes  Clarified goals of care      Additional Data Reviewed: CBC    Component Value Date/Time   WBC 3.4* 07/23/2015 0611   WBC 4.9 07/17/2015 1119   RBC 2.99* 07/23/2015 0611   RBC 2.65* 07/17/2015 1119   HGB 8.5* 07/23/2015 0611   HGB 7.3* 07/17/2015 1119   HCT 25.2*  07/23/2015 0611   HCT 22.9* 07/17/2015 1119   PLT 67* 07/23/2015 0611   PLT 86* 07/17/2015 1119   MCV 84.3 07/23/2015 0611   MCV 86.4 07/17/2015 1119   MCH 28.4 07/23/2015 0611   MCH 27.5 07/17/2015 1119   MCHC 33.7 07/23/2015 0611   MCHC 31.9* 07/17/2015 1119   RDW 17.9* 07/23/2015 0611   RDW 19.2* 07/17/2015 1119   LYMPHSABS 0.3* 07/20/2015 0802   LYMPHSABS 0.3* 07/17/2015 1119   MONOABS 0.4 07/20/2015 0802   MONOABS 0.5 07/17/2015 1119   EOSABS 0.0 07/20/2015 0802   EOSABS 0.0 07/17/2015 1119   BASOSABS 0.0 07/20/2015 0802   BASOSABS 0.0 07/17/2015 1119    CMP     Component Value Date/Time   NA 135 07/23/2015 0611   NA 140 08/28/2014 0903   K 3.6 07/23/2015 0611   K 4.1 08/28/2014 0903   CL 99* 07/23/2015 0611   CO2 26 07/23/2015 0611   CO2 22 08/28/2014 0903   GLUCOSE 132* 07/23/2015 0611   GLUCOSE 156* 08/28/2014 0903   BUN 13 07/23/2015 0611   BUN 10.9 08/28/2014 0903   CREATININE 0.58* 07/23/2015 0611   CREATININE 0.7 08/28/2014 0903   CALCIUM 7.5* 07/23/2015 0611   CALCIUM 9.0 08/28/2014 0903   PROT 5.5* 07/20/2015 0802   PROT 6.7 08/28/2014 0903   ALBUMIN 2.4* 07/20/2015 0802   ALBUMIN 3.7 08/28/2014 0903   AST 29 07/20/2015 0802   AST 11 08/28/2014 0903   ALT 12* 07/20/2015 0802   ALT 8 08/28/2014 0903   ALKPHOS 680* 07/20/2015 0802   ALKPHOS 198* 08/28/2014 0903   BILITOT 1.3*  07/20/2015 0802   BILITOT 0.39 08/28/2014 0903   GFRNONAA >60 07/23/2015 0611   GFRAA >60 07/23/2015 0611       Problem List:  Patient Active Problem List   Diagnosis Date Noted  . Symptomatic anemia 07/20/2015  . Acute blood loss anemia 07/06/2015  . Hyperlipidemia 07/04/2015  . Bilateral leg edema   . Gastrointestinal hemorrhage associated with gastric ulcer   . Severe anemia 03/13/2015  . GI bleed 03/13/2015  . CAP (community acquired pneumonia) 12/03/2014  . Anemia 12/03/2014  . Chest pain 12/03/2014  . COPD with emphysema ([place]) 12/03/2014  . High  cholesterol 12/03/2014  . Prolonged Q-T interval on ECG 12/03/2014  . Lactic acidosis 12/03/2014  . Nonspecific abnormal electrocardiogram (ECG) (EKG) 12/03/2014  . Gastric cancer ([place]) 01/10/2014  . Acute GI bleeding 11/23/2013  . Melena 11/23/2013  . Diabetes mellitus, type 2 ([place]) 11/23/2013  . Prostate cancer ([place]) 11/23/2013  . Acute lower GI bleeding 11/23/2013     Palliative Care Assessment & Plan    1.Code Status:  DNR    Code Status Orders        Start     Ordered   07/20/15 1029  Do not attempt resuscitation (DNR)   Continuous    Question Answer Comment  In the event of cardiac or respiratory ARREST Do not call a "code blue"   In the event of cardiac or respiratory ARREST Do not perform Intubation, CPR, defibrillation or ACLS   In the event of cardiac or respiratory ARREST Use medication by any route, position, wound care, and other measures to relive pain and suffering. May use oxygen, suction and manual treatment of airway obstruction as needed for comfort.      07/20/15 1028    Code Status History    Date Active Date Inactive Code Status Order ID Comments User Context   07/20/2015 10:12 AM 07/20/2015 10:28 AM Full Code [id number]  Radene Gunning, NP ED   07/04/2015  8:36 PM 07/07/2015  5:45 PM Full Code [id number]  Willia Craze, NP ED   03/13/2015  8:55 PM 03/16/2015  6:36 PM Full Code [id number]  Nita Sells, MD Inpatient   12/03/2014 10:03 AM 12/04/2014  4:19 PM Full Code [id number]  Melton Alar, PA-C Inpatient   11/23/2013  5:31 PM 11/25/2013  3:51 PM Full Code [id number]  Charlynne Cousins, MD Inpatient       2. Goals of Care/Additional Recommendations:  Melvin Gardner appears to have good understanding that he is coming to the end of his life.  We discussed that if he continues to have drop in hemoglobin that there is nothing medically available to fix his GI bleed.  His most recent hemoglobin after transfusion is 8.5. Family is in agreement with  rechecking this 12 hours after last lab draw.  If it continues to trend down, family is agreeable to discussion of comfort care and where he would like to go to focus on the end of his life.  If he does stabilize, we will still need to discuss end-of-life as this is going to be a recurrent problem or some other complication his advanced cancer is going to develop. His family would like to find out if he is still actively bleeding prior to further discussion.  I spoke with Dr. Wendee Beavers and will plan to recheck CBC this evening at 6 PM.  3. Symptom Management:      1.Wheezeing, cough, and SOB: Continue breathing treatments and morphine  prn.  4. Palliative Prophylaxis:   Aspiration, Delirium Protocol and Frequent Pain Assessment  5. Prognosis: Unable to determine due to acuity of condition, however if bleeding does not stop and he elects to pursue comfort based care, his expected prognosis would be a matter of days at best  6. Discharge Planning:  To be determined   Care plan was discussed with patient, his sons, and Dr. Wendee Beavers  Thank you for allowing the Palliative Medicine Team to assist in the care of this patient.   Time In: 1000 Time Out: 1055 Total Time 55 Prolonged Time Billed  No        Micheline Rough, MD  07/23/2015, 5:12 PM  Please contact Palliative Medicine Team phone at [phone] for questions and concerns.

## 2015-07-23 NOTE — Care Management Important Message (Signed)
Important Message  Patient Details  Name: Melvin Gardner MRN: [id number] Date of Birth: 1929-09-04   Medicare Important Message Given:  Yes    Loann Quill 07/23/2015, 8:45 AM

## 2015-07-24 DIAGNOSIS — D649 Anemia, unspecified: Secondary | ICD-10-CM

## 2015-07-24 DIAGNOSIS — R600 Localized edema: Secondary | ICD-10-CM

## 2015-07-24 DIAGNOSIS — C61 Malignant neoplasm of prostate: Secondary | ICD-10-CM

## 2015-07-24 DIAGNOSIS — C169 Malignant neoplasm of stomach, unspecified: Secondary | ICD-10-CM

## 2015-07-24 DIAGNOSIS — Z515 Encounter for palliative care: Secondary | ICD-10-CM | POA: Insufficient documentation

## 2015-07-24 LAB — BASIC METABOLIC PANEL
Anion gap: 13 (ref 5–15)
BUN: 12 mg/dL (ref 6–20)
CALCIUM: 7.2 mg/dL — AB (ref 8.9–10.3)
CO2: 26 mmol/L (ref 22–32)
CREATININE: 0.55 mg/dL — AB (ref 0.61–1.24)
Chloride: 97 mmol/L — ABNORMAL LOW (ref 101–111)
GFR calc Af Amer: >60 mL/min (ref >60)
Glucose, Bld: 135 mg/dL — ABNORMAL HIGH (ref 65–99)
POTASSIUM: 3.5 mmol/L (ref 3.5–5.1)
SODIUM: 136 mmol/L (ref 135–145)

## 2015-07-24 LAB — CBC
HCT: 24.2 % — ABNORMAL LOW (ref 39.0–52.0)
Hemoglobin: 8.1 g/dL — ABNORMAL LOW (ref 13.0–17.0)
MCH: 28.4 pg (ref 26.0–34.0)
MCHC: 33.5 g/dL (ref 30.0–36.0)
MCV: 84.9 fL (ref 78.0–100.0)
PLATELETS: 65 10*3/uL — AB (ref 150–400)
RBC: 2.85 MIL/uL — ABNORMAL LOW (ref 4.22–5.81)
RDW: 17.7 % — AB (ref 11.5–15.5)
WBC: 3.7 10*3/uL — AB (ref 4.0–10.5)

## 2015-07-24 LAB — GLUCOSE, CAPILLARY
GLUCOSE-CAPILLARY: 125 mg/dL — AB (ref 65–99)
GLUCOSE-CAPILLARY: 149 mg/dL — AB (ref 65–99)
GLUCOSE-CAPILLARY: 111 mg/dL — AB (ref 65–99)
Glucose-Capillary: 177 mg/dL — ABNORMAL HIGH (ref 65–99)

## 2015-07-24 NOTE — Progress Notes (Signed)
I met this evening with Mr Melvin Gardner and 2 of his sons.  We discussed that his Hgb continues to drop.  Down to 8.2 this evening from 8.5 this AM.  We talked again about the fact that his Hgb continues to trend down despite transfusions.  Plan to check again tomorrow AM.  If still downtrending, consider pursuing residential hospice for EOL care.  He and his sons are in agreement with this plan.  Dr. Anwar from out team to f/u tomorrow.  Melvin Freeman, MD [facility] Team [phone]  

## 2015-07-24 NOTE — Progress Notes (Signed)
[facility] PROGRESS NOTE  Melvin Gardner [id number] DOB: 1929-10-15 DOA: 07/20/2015 PCP: [name]., MD  Brief Summary  The patient is an 82 year old male with history of prostate cancer and recurrent gastric cancer which has caused him to have recurrent GI bleed requiring hospitalization and blood transfusion. He was admitted with symptomatic anemia, not responding to blood transfusion. DNR and plan for Residential hospice. Palliative care consulted.  Assessment/Plan:   Symptomatic anemia - pt is s/p transfusions. Palliative care team has been consulted and if patient continues to bleed plan will be to transition to residential hospice    Diabetes mellitus, type 2 ([place]) - We'll place on sliding scale insulin    Prostate cancer ([place])- advanced    Gastric cancer ([place]) - Most likely cause of principal problem    COPD with emphysema ([place]) - Currently compensated, continue albuterol as needed   Code Status: DO NOT RESUSCITATE Family Communication: Discussed with sons Disposition Plan: inpt hospice?   Consultants:  Palliative care  Procedures:  None  Antibiotics:  None  HPI/Subjective: Coughing while eating-- opened tea bag and dumped in coffee  Objective: Filed Vitals:   07/24/15 0527 07/24/15 1017  BP: 113/75 115/78  Pulse: 111 104  Temp: 98.5 F (36.9 C) 97.9 F (36.6 C)  Resp: 17 18    Intake/Output Summary (Last 24 hours) at 07/24/15 1132 Last data filed at 07/24/15 0900  Gross per 24 hour  Intake    480 ml  Output      0 ml  Net    480 ml   Filed Weights   07/20/15 2028 07/21/15 2033 07/23/15 2119  Weight: 70.6 kg (155 lb 10.3 oz) 71.2 kg (156 lb 15.5 oz) 73.982 kg (163 lb 1.6 oz)    Exam:   General: Patient is alert and awake, in no acute distress. Pleasant gentleman  Cardiovascular: S1 and S2 within normal limits  Respiratory: No increased work of breathing, no wheezes  Abdomen: Soft, no guarding,  nondistended  Musculoskeletal: No cyanosis  Data Reviewed: Basic Metabolic Panel:  Recent Labs Lab 07/20/15 0802 07/21/15 1200 07/22/15 0344 07/23/15 0611 07/24/15 0550  NA 136 134* 137 135 136  K 3.9 4.1 4.0 3.6 3.5  CL 101 99* 102 99* 97*  CO2 22 25 25 26 26   GLUCOSE 183* 151* 121* 132* 135*  BUN 25* 22* 17 13 12   CREATININE 0.61 0.60* 0.61 0.58* 0.55*  CALCIUM 8.3* 8.0* 7.9* 7.5* 7.2*   Liver Function Tests:  Recent Labs Lab 07/20/15 0802  AST 29  ALT 12*  ALKPHOS 680*  BILITOT 1.3*  PROT 5.5*  ALBUMIN 2.4*   No results for input(s): LIPASE, AMYLASE in the last 168 hours. No results for input(s): AMMONIA in the last 168 hours. CBC:  Recent Labs Lab 07/20/15 0802  07/21/15 1200 07/22/15 0344 07/23/15 0611 07/23/15 1830 07/24/15 0550  WBC 4.4  < > 3.3* 2.9* 3.4* 3.4* 3.7*  NEUTROABS 3.8  --   --   --   --   --   --   HGB 6.3*  < > 7.7* 7.4* 8.5* 8.2* 8.1*  HCT 20.0*  < > 24.3* 23.3* 25.2* 24.1* 24.2*  MCV 85.1  < > 85.3 85.7 84.3 84.0 84.9  PLT 80*  < > 83* 72* 67* 62* 65*  < > = values in this interval not displayed. Cardiac Enzymes: No results for input(s): CKTOTAL, CKMB, CKMBINDEX, TROPONINI in the last 168 hours. BNP (last 3 results)  Recent  Labs  03/13/15 1727 07/04/15 2214 07/20/15 0802  BNP 92.4 132.3* 187.9*    ProBNP (last 3 results) No results for input(s): PROBNP in the last 8760 hours.  CBG:  Recent Labs Lab 07/22/15 0136 07/22/15 0826 07/22/15 1222 07/23/15 2117 07/24/15 0746  GLUCAP 95 108* 120* 135* 125*    Recent Results (from the past 240 hour(s))  TECHNOLOGIST REVIEW     Status: None   Collection Time: 07/17/15 11:19 AM  Result Value Ref Range Status   Technologist Review   Final    Sl poly, Few teardrop, ovalos, schistocytes, target, and burr cells, occ helmet and acanthocyte     Studies: No results found.  Scheduled Meds: . sodium chloride  10 mL/hr Intravenous Once  . sodium chloride   Intravenous Once   . albuterol  5 mg Nebulization Once  . bicalutamide  50 mg Oral Daily  . chlorpheniramine-HYDROcodone  5 mL Oral Q12H  . ferrous sulfate  325 mg Oral Q breakfast  . furosemide  40 mg Intravenous BID  . guaiFENesin  600 mg Oral BID  . insulin aspart  0-9 Units Subcutaneous TID WC  . mometasone-formoterol  2 puff Inhalation BID  . pantoprazole (PROTONIX) IV  40 mg Intravenous Q12H  . sodium chloride flush  3 mL Intravenous Q12H  . tamsulosin  0.4 mg Oral QPC breakfast   Continuous Infusions:   Time spent: > 35 minutes   [facility] Pager [phone]. If 7PM-7AM, please contact night-coverage at [email or web address], password [facility] 07/24/2015, 11:32 AM  LOS: 4 days

## 2015-07-24 NOTE — Progress Notes (Signed)
Daily Progress Note   Patient Name: Melvin Gardner       Date: 07/24/2015 DOB: 1929/06/14  Age: 82 y.o. MRN#: [id number] Attending Physician: Geradine Girt, DO Primary Care Physician: [name]., MD Admit Date: 07/20/2015  Reason for Consultation/Follow-up: Establishing goals of care  Subjective: Patient mildly confused this am, on the telephone, trying to talk with his son.  Discussed with the patient's son in the room: the patient continued to have drop in his Hgb.  Disposition options discussed: it appears appropriate to pursue residential hospice, patient with ongoing GI Bleed, history of prostate cancer and recurrent gastric cancer.   Length of Stay: 4 days  Current Medications: Scheduled Meds:  . sodium chloride  10 mL/hr Intravenous Once  . sodium chloride   Intravenous Once  . albuterol  5 mg Nebulization Once  . bicalutamide  50 mg Oral Daily  . chlorpheniramine-HYDROcodone  5 mL Oral Q12H  . ferrous sulfate  325 mg Oral Q breakfast  . furosemide  40 mg Intravenous BID  . guaiFENesin  600 mg Oral BID  . insulin aspart  0-9 Units Subcutaneous TID WC  . mometasone-formoterol  2 puff Inhalation BID  . pantoprazole (PROTONIX) IV  40 mg Intravenous Q12H  . sodium chloride flush  3 mL Intravenous Q12H  . tamsulosin  0.4 mg Oral QPC breakfast    Continuous Infusions:    PRN Meds: acetaminophen **OR** acetaminophen, albuterol, benzonatate, HYDROcodone-acetaminophen, morphine injection, morphine CONCENTRATE, ondansetron **OR** ondansetron (ZOFRAN) IV, traZODone  Physical Exam: Physical Exam       General: Adult male, No acute distress, intermittent cough  HEENT: NCAT, MMM  Cardiovascular: RRR, nl S1, S2 no mrg, 2+ pulses,   Respiratory: Diminished  bilaterally, wheeze/rales at the left base, no increased WOB  Abdomen: soft, mildly distended, nontender  MSK: Normal tone and bulk, 3+ pitting bilateral LE, warm  Neuro: Grossly intact, moves 4 extremities         Vital Signs: BP 115/78 mmHg  Pulse 104  Temp(Src) 97.9 F (36.6 C) (Oral)  Resp 18  Ht 5\' 9"  (1.753 m)  Wt 73.982 kg (163 lb 1.6 oz)  BMI 24.07 kg/m2  SpO2 98% SpO2: SpO2: 98 % O2 Device: O2 Device: Not Delivered O2 Flow Rate: O2 Flow Rate (L/min): 2 L/min  Intake/output  summary:   Intake/Output Summary (Last 24 hours) at 07/24/15 1304 Last data filed at 07/24/15 0900  Gross per 24 hour  Intake    480 ml  Output      0 ml  Net    480 ml   LBM: Last BM Date: 07/21/15 Baseline Weight: Weight: 70.8 kg (156 lb 1.4 oz) Most recent weight: Weight: 73.982 kg (163 lb 1.6 oz)       Palliative Assessment/Data: Flowsheet Rows        Most Recent Value   Intake Tab    Referral Department  Hospitalist   Unit at Time of Referral  Med/Surg Unit   Palliative Care Primary Diagnosis  Cancer   Date Notified  07/20/15   Palliative Care Type  New Palliative care   Reason for referral  Clarify Goals of Care, Counsel Regarding Hospice   Date of Admission  07/20/15   Date first seen by Palliative Care  07/22/15   # of days Palliative referral response time  2 Day(s)   # of days IP prior to Palliative referral  0   Clinical Assessment    Palliative Performance Scale Score  60%   Pain Max last 24 hours  2   Pain Min Last 24 hours  0   Psychosocial & Spiritual Assessment    Palliative Care Outcomes    Patient/Family meeting held?  Yes   Who was at the meeting?  Patient   Palliative Care Outcomes  Clarified goals of care      Additional Data Reviewed: CBC    Component Value Date/Time   WBC 3.7* 07/24/2015 0550   WBC 4.9 07/17/2015 1119   RBC 2.85* 07/24/2015 0550   RBC 2.65* 07/17/2015 1119   HGB 8.1* 07/24/2015 0550   HGB 7.3* 07/17/2015 1119   HCT 24.2*  07/24/2015 0550   HCT 22.9* 07/17/2015 1119   PLT 65* 07/24/2015 0550   PLT 86* 07/17/2015 1119   MCV 84.9 07/24/2015 0550   MCV 86.4 07/17/2015 1119   MCH 28.4 07/24/2015 0550   MCH 27.5 07/17/2015 1119   MCHC 33.5 07/24/2015 0550   MCHC 31.9* 07/17/2015 1119   RDW 17.7* 07/24/2015 0550   RDW 19.2* 07/17/2015 1119   LYMPHSABS 0.3* 07/20/2015 0802   LYMPHSABS 0.3* 07/17/2015 1119   MONOABS 0.4 07/20/2015 0802   MONOABS 0.5 07/17/2015 1119   EOSABS 0.0 07/20/2015 0802   EOSABS 0.0 07/17/2015 1119   BASOSABS 0.0 07/20/2015 0802   BASOSABS 0.0 07/17/2015 1119    CMP     Component Value Date/Time   NA 136 07/24/2015 0550   NA 140 08/28/2014 0903   K 3.5 07/24/2015 0550   K 4.1 08/28/2014 0903   CL 97* 07/24/2015 0550   CO2 26 07/24/2015 0550   CO2 22 08/28/2014 0903   GLUCOSE 135* 07/24/2015 0550   GLUCOSE 156* 08/28/2014 0903   BUN 12 07/24/2015 0550   BUN 10.9 08/28/2014 0903   CREATININE 0.55* 07/24/2015 0550   CREATININE 0.7 08/28/2014 0903   CALCIUM 7.2* 07/24/2015 0550   CALCIUM 9.0 08/28/2014 0903   PROT 5.5* 07/20/2015 0802   PROT 6.7 08/28/2014 0903   ALBUMIN 2.4* 07/20/2015 0802   ALBUMIN 3.7 08/28/2014 0903   AST 29 07/20/2015 0802   AST 11 08/28/2014 0903   ALT 12* 07/20/2015 0802   ALT 8 08/28/2014 0903   ALKPHOS 680* 07/20/2015 0802   ALKPHOS 198* 08/28/2014 0903   BILITOT 1.3* 07/20/2015 0802  BILITOT 0.39 08/28/2014 0903   GFRNONAA >60 07/24/2015 0550   GFRAA >60 07/24/2015 0550       Problem List:  Patient Active Problem List   Diagnosis Date Noted  . Symptomatic anemia 07/20/2015  . Acute blood loss anemia 07/06/2015  . Hyperlipidemia 07/04/2015  . Bilateral leg edema   . Gastrointestinal hemorrhage associated with gastric ulcer   . Severe anemia 03/13/2015  . GI bleed 03/13/2015  . CAP (community acquired pneumonia) 12/03/2014  . Anemia 12/03/2014  . Chest pain 12/03/2014  . 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 High  cholesterol 12/03/2014  . Prolonged Q-T interval on ECG 12/03/2014  . Lactic acidosis 12/03/2014  . Nonspecific abnormal electrocardiogram (ECG) (EKG) 12/03/2014  . Gastric cancer ([place]) 01/10/2014  . Acute GI bleeding 11/23/2013  . Melena 11/23/2013  . Diabetes mellitus, type 2 ([place]) 11/23/2013  . Prostate cancer ([place]) 11/23/2013  . Acute lower GI bleeding 11/23/2013     Palliative Care Assessment & Plan    1.Code Status:  DNR    Code Status Orders        Start     Ordered   07/20/15 1029  Do not attempt resuscitation (DNR)   Continuous    Question Answer Comment  In the event of cardiac or respiratory ARREST Do not call a "code blue"   In the event of cardiac or respiratory ARREST Do not perform Intubation, CPR, defibrillation or ACLS   In the event of cardiac or respiratory ARREST Use medication by any route, position, wound care, and other measures to relive pain and suffering. May use oxygen, suction and manual treatment of airway obstruction as needed for comfort.      07/20/15 1028    Code Status History    Date Active Date Inactive Code Status Order ID Comments User Context   07/20/2015 10:12 AM 07/20/2015 10:28 AM Full Code [id number]  Radene Gunning, NP ED   07/04/2015  8:36 PM 07/07/2015  5:45 PM Full Code [id number]  Willia Craze, NP ED   03/13/2015  8:55 PM 03/16/2015  6:36 PM Full Code [id number]  Nita Sells, MD Inpatient   12/03/2014 10:03 AM 12/04/2014  4:19 PM Full Code [id number]  Melton Alar, PA-C Inpatient   11/23/2013  5:31 PM 11/25/2013  3:51 PM Full Code [id number]  Charlynne Cousins, MD Inpatient       2. Goals of Care/Additional Recommendations:  Mr. Locke continues to have drop in hemoglobin, hence after additional discussions with the patient's son this am, recommend residential hospice, son is agreeable. Another son coming into town by Friday.    3. Symptom Management:      1.Wheezeing, cough, and SOB: Continue breathing treatments and  morphine prn.  4. Palliative Prophylaxis:   Aspiration, Delirium Protocol and Frequent Pain Assessment  5. Prognosis: days    6. Discharge Planning:  Residential hospice    Care plan was discussed with patient, his son  and Dr. Eliseo Squires Thank you for allowing the Palliative Medicine Team to assist in the care of this patient.   Time In:  9 Time Out:  925 Total Time  25 Prolonged Time Billed  No       [id number] Loistine Chance, MD  07/24/2015, 1:04 PM  Please contact Palliative Medicine Team phone at [phone] for questions and concerns.

## 2015-07-24 NOTE — Clinical Social Work Note (Signed)
CSW received consult for residential hospice placement. Families' preference is [facility]. Referral made to Erling Conte, CSW with [facility], and CSW later advised that they have no beds. Second choice is [facility] and contact made with Beverlee Nims, nurse liaison. As of now patient has no symptoms to manage, however they will review again tomorrow to determine if any change in his condition. Patient information will also be reviewed by hospice medical director.  CSW will f/u with nurse liaison on Thursday.  Melvin Gardner, MSW, LCSW Licensed Clinical Social Worker Clinical Social Work [place] [phone] ]

## 2015-07-25 LAB — CBC
HEMATOCRIT: 25 % — AB (ref 39.0–52.0)
HEMOGLOBIN: 8.3 g/dL — AB (ref 13.0–17.0)
MCH: 28.2 pg (ref 26.0–34.0)
MCHC: 33.2 g/dL (ref 30.0–36.0)
MCV: 85 fL (ref 78.0–100.0)
Platelets: 67 10*3/uL — ABNORMAL LOW (ref 150–400)
RBC: 2.94 MIL/uL — AB (ref 4.22–5.81)
RDW: 17.4 % — ABNORMAL HIGH (ref 11.5–15.5)
WBC: 5.1 10*3/uL (ref 4.0–10.5)

## 2015-07-25 LAB — BASIC METABOLIC PANEL
ANION GAP: 10 (ref 5–15)
BUN: 8 mg/dL (ref 6–20)
CO2: 29 mmol/L (ref 22–32)
Calcium: 7.1 mg/dL — ABNORMAL LOW (ref 8.9–10.3)
Chloride: 98 mmol/L — ABNORMAL LOW (ref 101–111)
Creatinine, Ser: 0.6 mg/dL — ABNORMAL LOW (ref 0.61–1.24)
GFR calc Af Amer: >60 mL/min (ref >60)
GLUCOSE: 134 mg/dL — AB (ref 65–99)
POTASSIUM: 3.2 mmol/L — AB (ref 3.5–5.1)
SODIUM: 137 mmol/L (ref 135–145)

## 2015-07-25 LAB — GLUCOSE, CAPILLARY
GLUCOSE-CAPILLARY: 143 mg/dL — AB (ref 65–99)
GLUCOSE-CAPILLARY: 129 mg/dL — AB (ref 65–99)
Glucose-Capillary: 121 mg/dL — ABNORMAL HIGH (ref 65–99)
Glucose-Capillary: 107 mg/dL — ABNORMAL HIGH (ref 65–99)

## 2015-07-25 MED ORDER — POTASSIUM CHLORIDE CRYS ER 20 MEQ PO TBCR
40.0000 meq | EXTENDED_RELEASE_TABLET | Freq: Once | ORAL | Status: AC
  Administered 2015-07-25: 40 meq via ORAL
  Filled 2015-07-25: qty 2

## 2015-07-25 NOTE — NC FL2 (Signed)
[place] LEVEL OF CARE SCREENING TOOL     IDENTIFICATION  Patient Name: Melvin Gardner Birthdate: 1929-09-20 Sex: male Admission Date (Current Location): 07/20/2015  [facility] and [place] Number:  Herbalist and Address:  [facility], [place] [address] [place], [place] [address]      Provider Number: [id number]  Attending Physician Name and Address:  Geradine Girt, DO  Relative Name and Phone Number:  Kongpheng Esperanza - son.  Phone - [phone]    Current Level of Care: Hospital Recommended Level of Care: [place] Prior Approval Number:    Date Approved/Denied:   PASRR Number: [id number] (Eff. 07/25/15)  Discharge Plan: SNF    Current Diagnoses: Patient Active Problem List   Diagnosis Date Noted  . Encounter for palliative care   . Symptomatic anemia 07/20/2015  . Acute blood loss anemia 07/06/2015  . Hyperlipidemia 07/04/2015  . Bilateral leg edema   . Gastrointestinal hemorrhage associated with gastric ulcer   . Severe anemia 03/13/2015  . GI bleed 03/13/2015  . CAP (community acquired pneumonia) 12/03/2014  . Anemia 12/03/2014  . Chest pain 12/03/2014  . 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 High cholesterol 12/03/2014  . Prolonged Q-T interval on ECG 12/03/2014  . Lactic acidosis 12/03/2014  . Nonspecific abnormal electrocardiogram (ECG) (EKG) 12/03/2014  . Gastric cancer ([place]) 01/10/2014  . Acute GI bleeding 11/23/2013  . Melena 11/23/2013  . Diabetes mellitus, type 2 ([place]) 11/23/2013  . Prostate cancer ([place]) 11/23/2013  . Acute lower GI bleeding 11/23/2013    Orientation RESPIRATION BLADDER Height & Weight     Self, Place (Patient is confused. )  Normal Continent Weight: 155 lb 8 oz (70.534 kg) Height:  5\' 9"  (175.3 cm)  BEHAVIORAL SYMPTOMS/MOOD NEUROLOGICAL BOWEL NUTRITION STATUS      Continent Diet (Regular)  AMBULATORY STATUS COMMUNICATION OF NEEDS Skin   Limited Assist Verbally  Normal                       Personal Care Assistance Level of Assistance  Bathing, Feeding, Dressing Bathing Assistance: Limited assistance Feeding assistance: Independent Dressing Assistance: Limited assistance     Functional Limitations Info  Sight, Hearing, Speech Sight Info: Adequate Hearing Info: Adequate Speech Info: Adequate    SPECIAL CARE FACTORS FREQUENCY   (Patient has not been evaluated by physical or occupational therapy as of 3/9.)                    Contractures Contractures Info: Not present    Additional Factors Info  Code Status, Allergies, Insulin Sliding Scale Code Status Info: DNR Allergies Info: No known allergies   Insulin Sliding Scale Info: 0-9 units sliding scale - 3X per day with meals       Current Medications (07/25/2015):  This is the current hospital active medication list Current Facility-Administered Medications  Medication Dose Route Frequency Provider Last Rate Last Dose  . 0.9 %  sodium chloride infusion  10 mL/hr Intravenous Once Quintella Reichert, MD   Stopped at 07/20/15 [phone]  . 0.9 %  sodium chloride infusion   Intravenous Once Janece Canterbury, MD      . acetaminophen (TYLENOL) tablet 650 mg  650 mg Oral Q6H PRN Radene Gunning, NP   650 mg at 07/23/15 1719   Or  . acetaminophen (TYLENOL) suppository 650 mg  650 mg Rectal Q6H PRN Radene Gunning, NP      .  albuterol (PROVENTIL) (2.5 MG/3ML) 0.083% nebulizer solution 2.5 mg  2.5 mg Nebulization Q6H PRN Debbe Odea, MD   2.5 mg at 07/23/15 0814  . albuterol (PROVENTIL) (2.5 MG/3ML) 0.083% nebulizer solution 5 mg  5 mg Nebulization Once Quintella Reichert, MD   5 mg at 07/20/15 1053  . benzonatate (TESSALON) capsule 100 mg  100 mg Oral TID PRN Debbe Odea, MD   100 mg at 07/23/15 1859  . bicalutamide (CASODEX) tablet 50 mg  50 mg Oral Daily Radene Gunning, NP   50 mg at 07/25/15 0836  . chlorpheniramine-HYDROcodone (TUSSIONEX) 10-8 MG/5ML suspension 5 mL  5 mL Oral Q12H Debbe Odea, MD    5 mL at 07/25/15 0835  . ferrous sulfate tablet 325 mg  325 mg Oral Q breakfast Radene Gunning, NP   325 mg at 07/25/15 0836  . furosemide (LASIX) injection 40 mg  40 mg Intravenous BID Janece Canterbury, MD   40 mg at 07/25/15 0835  . guaiFENesin (MUCINEX) 12 hr tablet 600 mg  600 mg Oral BID Domenic Polite, MD   600 mg at 07/25/15 0835  . HYDROcodone-acetaminophen (NORCO/VICODIN) 5-325 MG per tablet 1-2 tablet  1-2 tablet Oral Q4H PRN Radene Gunning, NP   1 tablet at 07/22/15 0038  . insulin aspart (novoLOG) injection 0-9 Units  0-9 Units Subcutaneous TID WC Velvet Bathe, MD   1 Units at 07/25/15 [phone]  . mometasone-formoterol (DULERA) 200-5 MCG/ACT inhaler 2 puff  2 puff Inhalation BID Radene Gunning, NP   2 puff at 07/24/15 1935  . morphine CONCENTRATE 10 MG/0.5ML oral solution 5 mg  5 mg Oral Q1H PRN Janece Canterbury, MD      . ondansetron [facility]) tablet 4 mg  4 mg Oral Q6H PRN Radene Gunning, NP       Or  . ondansetron [facility]) injection 4 mg  4 mg Intravenous Q6H PRN Radene Gunning, NP      . pantoprazole (PROTONIX) injection 40 mg  40 mg Intravenous Q12H Radene Gunning, NP   40 mg at 07/25/15 0835  . potassium chloride SA (K-DUR,KLOR-CON) CR tablet 40 mEq  40 mEq Oral Once Geradine Girt, DO      . sodium chloride flush (NS) 0.9 % injection 3 mL  3 mL Intravenous Q12H Radene Gunning, NP   3 mL at 07/25/15 0836  . tamsulosin (FLOMAX) capsule 0.4 mg  0.4 mg Oral QPC breakfast Radene Gunning, NP   0.4 mg at 07/25/15 0835  . traZODone (DESYREL) tablet 25 mg  25 mg Oral QHS PRN Radene Gunning, NP   25 mg at 07/21/15 2102     Discharge Medications: Please see discharge summary for a list of discharge medications.  Relevant Imaging Results:  Relevant Lab Results:   Additional Information ss#[id number].  Sable Feil, LCSW

## 2015-07-25 NOTE — Clinical Social Work Note (Signed)
Clinical Social Work Assessment  Patient Details  Name: Melvin Gardner MRN: [id number] Date of Birth: Jan 06, 1930  Date of referral:  07/24/15               Reason for consult:  Facility Placement                Permission sought to share information with:  Family Supports Permission granted to share information::  Yes, Verbal Permission Granted  Name::     Axyl Niemeier - [phone].  Estephen Langenfeld - son at the bedside on 07/24/15.  Agency::     Relationship::  Sons  Contact Information:  [phone] - [place]  Housing/Transportation Living arrangements for the past 2 months:  Apartment Source of Information:  Adult Children (Talked with sons Esbie at the bedside and Craige by phone) Patient Interpreter Needed:  None Criminal Activity/Legal Involvement Pertinent to Current Situation/Hospitalization:  No - Comment as needed Significant Relationships:  Adult Children Lives with:  Self Do you feel safe going back to the place where you live?  No (Sons understand that patient is very ill and cannot return home alone.) Need for family participation in patient care:     Care giving concerns:  Sons very concerned about their father as he lives at home alone and they all live out of town.   Social Worker assessment / plan:  CSW talked with patient and son Timmothy Sours (lives in [place]) on 3/8 regarding discharge plans and Hospice placement. Timmothy Sours indicated that they want [facility], and informed CSW that the primary contact is his brother [place] who lives in [place]., and provided CSW with his phone number.  CSW talked by phone with [place] on 3/9 regarding discharge planning for patient. He was informed that [facility] had no beds and [facility] had evaluated patient and he is not yet residential hospice appropriate. [place] was able to talk with Yetta Glassman, nurse liaison for [facility] regarding hospice eligibility. Mr. Romer and CSW talked about discharge options, home vs. SNF and he is agreeable to  skilled placement for short-term as patient lives alone and would have no one there to assist him. CSW explained the skilled facility search process and emailed SNF list to son.   Employment status:  Retired Forensic scientist:  Information systems manager, Medicaid In [place] PT Recommendations:  Not assessed at this time [place] / Referral to community resources:  [place] (skilled facility list for [place] to son - [email or web address])  Patient/Family's Response to care:  Son disappointed that patient cannot go to hospice as this is what he was told by the doctor, however he is agreeable to ST rehab and the transition later to hospice.  Patient/Family's Understanding of and Emotional Response to Diagnosis, Current Treatment, and Prognosis:  Prognosis discussed in light of patient's eminent decline and possible transfer to residential hospice.  Emotional Assessment Appearance:  Appears younger than stated age Attitude/Demeanor/Rapport:  Other (Pleasant) Affect (typically observed):  Pleasant, Appropriate, Happy Orientation:  Oriented to Self, Oriented to Place Alcohol / Substance use:  Tobacco Use (Patient reported that he quit smoking and does not drink or use illicit drugs) Psych involvement (Current and /or in the community):  No (Comment)  Discharge Needs  Concerns to be addressed:  Discharge Planning Concerns Readmission within the last 30 days:  Yes Current discharge risk:  None Barriers to Discharge:  No Barriers Identified   Sable Feil, LCSW 07/25/2015, 3:21 PM

## 2015-07-25 NOTE — Consult Note (Signed)
[facility] The patient was seen yesterday and today to evaluate for end of life care at [facility]. The medical record and clinical findings were reviewed by Dr. Beverlyn Roux. The patient does not currently meet criteria for inpatient hospice. The patient does meet criteria for hospice services in the home or at a facility, if not using Med A days. Thank you for this referral. Should the patient's clinical status significantly change prior to discharge, we will be available to re-evaluate.  Yetta Glassman RN [place] Cell: [phone]

## 2015-07-25 NOTE — Progress Notes (Signed)
[facility] PROGRESS NOTE  ELISE LORIS [id number] DOB: 1929/12/28 DOA: 07/20/2015 PCP: [name]., MD  Brief Summary  The patient is an 82 year old male with history of prostate cancer and recurrent gastric cancer which has caused him to have recurrent GI bleed requiring hospitalization and blood transfusion. He was admitted with symptomatic anemia, not responding to blood transfusion. DNR and plan for SNF with palliative care/hospice  Assessment/Plan:   Symptomatic anemia - pt is s/p transfusions. H/h trending down now stable    Diabetes mellitus, type 2 (HCC) -  sliding scale insulin    Prostate cancer ([place])- advanced    Gastric cancer ([place]) - Most likely cause of principal problem    COPD with emphysema ([place]) - Currently compensated, continue albuterol as needed   Code Status: DO NOT RESUSCITATE Family Communication: Discussed with sons 3/8 Disposition Plan: SNF with hospice   Consultants:  Palliative care  Procedures:  None  Antibiotics:  None  HPI/Subjective: Asking for breathing treatment  Objective: Filed Vitals:   07/25/15 0433 07/25/15 1000  BP: 127/70 124/75  Pulse: 102 75  Temp: 98.2 F (36.8 C) 98.6 F (37 C)  Resp: 18 18    Intake/Output Summary (Last 24 hours) at 07/25/15 1410 Last data filed at 07/25/15 1015  Gross per 24 hour  Intake    720 ml  Output   1375 ml  Net   -655 ml   Filed Weights   07/21/15 2033 07/23/15 2119 07/24/15 2128  Weight: 71.2 kg (156 lb 15.5 oz) 73.982 kg (163 lb 1.6 oz) 70.534 kg (155 lb 8 oz)    Exam:   General: Patient is alert and awake, in no acute distress. Pleasant   Cardiovascular: S1 and S2 within normal limits  Respiratory: No increased work of breathing, no wheezes  Abdomen: Soft, no guarding, nondistended  Musculoskeletal: No cyanosis  Data Reviewed: Basic Metabolic Panel:  Recent Labs Lab 07/21/15 1200 07/22/15 0344 07/23/15 0611 07/24/15 0550  07/25/15 0724  NA 134* 137 135 136 137  K 4.1 4.0 3.6 3.5 3.2*  CL 99* 102 99* 97* 98*  CO2 25 25 26 26 29   GLUCOSE 151* 121* 132* 135* 134*  BUN 22* 17 13 12 8   CREATININE 0.60* 0.61 0.58* 0.55* 0.60*  CALCIUM 8.0* 7.9* 7.5* 7.2* 7.1*   Liver Function Tests:  Recent Labs Lab 07/20/15 0802  AST 29  ALT 12*  ALKPHOS 680*  BILITOT 1.3*  PROT 5.5*  ALBUMIN 2.4*   No results for input(s): LIPASE, AMYLASE in the last 168 hours. No results for input(s): AMMONIA in the last 168 hours. CBC:  Recent Labs Lab 07/20/15 0802  07/22/15 0344 07/23/15 0611 07/23/15 1830 07/24/15 0550 07/25/15 0724  WBC 4.4  < > 2.9* 3.4* 3.4* 3.7* 5.1  NEUTROABS 3.8  --   --   --   --   --   --   HGB 6.3*  < > 7.4* 8.5* 8.2* 8.1* 8.3*  HCT 20.0*  < > 23.3* 25.2* 24.1* 24.2* 25.0*  MCV 85.1  < > 85.7 84.3 84.0 84.9 85.0  PLT 80*  < > 72* 67* 62* 65* 67*  < > = values in this interval not displayed. Cardiac Enzymes: No results for input(s): CKTOTAL, CKMB, CKMBINDEX, TROPONINI in the last 168 hours. BNP (last 3 results)  Recent Labs  03/13/15 1727 07/04/15 2214 07/20/15 0802  BNP 92.4 132.3* 187.9*    ProBNP (last 3 results) No results for input(s): PROBNP in  the last 8760 hours.  CBG:  Recent Labs Lab 07/24/15 1144 07/24/15 1619 07/24/15 2123 07/25/15 0734 07/25/15 1149  GLUCAP 149* 111* 177* 121* 143*    Recent Results (from the past 240 hour(s))  TECHNOLOGIST REVIEW     Status: None   Collection Time: 07/17/15 11:19 AM  Result Value Ref Range Status   Technologist Review   Final    Sl poly, Few teardrop, ovalos, schistocytes, target, and burr cells, occ helmet and acanthocyte     Studies: No results found.  Scheduled Meds: . sodium chloride  10 mL/hr Intravenous Once  . sodium chloride   Intravenous Once  . albuterol  5 mg Nebulization Once  . bicalutamide  50 mg Oral Daily  . chlorpheniramine-HYDROcodone  5 mL Oral Q12H  . ferrous sulfate  325 mg Oral Q  breakfast  . furosemide  40 mg Intravenous BID  . guaiFENesin  600 mg Oral BID  . insulin aspart  0-9 Units Subcutaneous TID WC  . mometasone-formoterol  2 puff Inhalation BID  . pantoprazole (PROTONIX) IV  40 mg Intravenous Q12H  . potassium chloride  40 mEq Oral Once  . sodium chloride flush  3 mL Intravenous Q12H  . tamsulosin  0.4 mg Oral QPC breakfast   Continuous Infusions:   Time spent: 25 minutes   [facility] Pager [phone]. If 7PM-7AM, please contact night-coverage at [email or web address], password [facility] 07/25/2015, 2:10 PM  LOS: 5 days

## 2015-07-25 NOTE — Clinical Social Work Placement (Addendum)
   CLINICAL SOCIAL WORK PLACEMENT  NOTE 3/9 - SEE NOTES BELOW.  Date:  07/25/2015  Patient Details  Name: Melvin Gardner MRN: [id number] Date of Birth: [birth year]  Clinical Social Work is seeking post-discharge placement for this patient at the [place] level of care (*CSW will initial, date and re-position this form in  chart as items are completed):  Yes (Emailed to son Warden/ranger)   Patient/family provided with [facility] Work Department's list of facilities offering this level of care within the geographic area requested by the patient (or if unable, by the patient's family).  Yes   Patient/family informed of their freedom to choose among providers that offer the needed level of care, that participate in Medicare, Medicaid or managed care program needed by the patient, have an available bed and are willing to accept the patient.  Yes   Patient/family informed of [facility]'s ownership interest in [facility] and [facility], as well as of the fact that they are under no obligation to receive care at these facilities.  PASRR submitted to EDS on 07/25/15     PASRR number received on 07/25/15     Existing PASRR number confirmed on       FL2 transmitted to all facilities in geographic area requested by pt/family on 07/25/15     FL2 transmitted to all facilities within larger geographic area on       Patient informed that his/her managed care company has contracts with or will negotiate with certain facilities, including the following:         07/25/15 - Patient/family informed of bed offers received. Talked to son by phone and gave facility responses.  Talked with son again at 6:45 pm and his facility choice is [facility]. Also talked with son about expanding hospice search to other counties and he declined, stating that patient's family is in [place] and he does not want a hospice facility any further than [place].  Patient chooses bed at        Physician recommends and patient chooses bed at      Patient to be transferred to   on  .  Patient to be transferred to facility by       Patient family notified on   of transfer.  Name of family member notified:        PHYSICIAN       Additional Comment:    _______________________________________________ Sable Feil, LCSW 07/25/2015, 3:29 PM

## 2015-07-26 LAB — GLUCOSE, CAPILLARY
GLUCOSE-CAPILLARY: 141 mg/dL — AB (ref 65–99)
Glucose-Capillary: 154 mg/dL — ABNORMAL HIGH (ref 65–99)
Glucose-Capillary: 116 mg/dL — ABNORMAL HIGH (ref 65–99)

## 2015-07-26 MED ORDER — TRAZODONE HCL 50 MG PO TABS: 25.0000 mg | ORAL_TABLET | Freq: Every evening | ORAL | Status: AC | PRN

## 2015-07-26 MED ORDER — BENZONATATE 100 MG PO CAPS: 100.0000 mg | ORAL_CAPSULE | Freq: Three times a day (TID) | ORAL | Status: AC | PRN

## 2015-07-26 MED ORDER — MORPHINE SULFATE (CONCENTRATE) 10 MG/0.5ML PO SOLN: 5.0000 mg | ORAL | Status: AC | PRN

## 2015-07-26 MED ORDER — HYDROCOD POLST-CPM POLST ER 10-8 MG/5ML PO SUER: 5.0000 mL | Freq: Two times a day (BID) | ORAL | Status: AC | PRN

## 2015-07-26 MED ORDER — MORPHINE SULFATE (CONCENTRATE) 10 MG/0.5ML PO SOLN: 5.0000 mg | ORAL | Status: DC | PRN

## 2015-07-26 NOTE — Progress Notes (Signed)
Daily Progress Note   Patient Name: Melvin Gardner       Date: 07/26/2015 DOB: 07-01-1929  Age: 82 y.o. MRN#: [id number] Attending Physician: Geradine Girt, DO Primary Care Physician: [name]., MD Admit Date: 07/20/2015  Reason for Consultation/Follow-up: Establishing goals of care  Subjective: Patient mildly confused this am, did not eat much breakfast  Discussed with the patient's son Cecilie Lowers over the phone with regards to disposition options.  Patient to now go to SNF rehab with palliative services, discussed with son about how to approach/align hospice services if the patient acutely declines at the SNF.   Length of Stay: 6 days  Current Medications: Scheduled Meds:  . sodium chloride  10 mL/hr Intravenous Once  . sodium chloride   Intravenous Once  . albuterol  5 mg Nebulization Once  . bicalutamide  50 mg Oral Daily  . chlorpheniramine-HYDROcodone  5 mL Oral Q12H  . ferrous sulfate  325 mg Oral Q breakfast  . furosemide  40 mg Intravenous BID  . guaiFENesin  600 mg Oral BID  . insulin aspart  0-9 Units Subcutaneous TID WC  . mometasone-formoterol  2 puff Inhalation BID  . pantoprazole (PROTONIX) IV  40 mg Intravenous Q12H  . sodium chloride flush  3 mL Intravenous Q12H  . tamsulosin  0.4 mg Oral QPC breakfast    Continuous Infusions:    PRN Meds: acetaminophen **OR** acetaminophen, albuterol, benzonatate, HYDROcodone-acetaminophen, morphine CONCENTRATE, ondansetron **OR** ondansetron (ZOFRAN) IV, traZODone  Physical Exam: Physical Exam      Awake alert pleasant, does not appear to be in distress  General: Adult male, No acute distress, intermittent cough  HEENT: NCAT, MMM  Cardiovascular: RRR, nl S1, S2 no mrg, 2+ pulses,   Respiratory:  Diminished bilaterally, wheeze/rales at the left base, no increased WOB  Abdomen: soft, mildly distended, nontender  MSK:   2+ pitting bilateral LE, warm  Neuro: Grossly intact, moves 4 extremities         Vital Signs: BP 116/71 mmHg  Pulse 116  Temp(Src) 98.8 F (37.1 C) (Oral)  Resp 18  Ht 5\' 9"  (1.753 m)  Wt 69.6 kg (153 lb 7 oz)  BMI 22.65 kg/m2  SpO2 97% SpO2: SpO2: 97 % O2 Device: O2 Device: Not Delivered O2 Flow Rate: O2 Flow Rate (L/min): 2  L/min  Intake/output summary:   Intake/Output Summary (Last 24 hours) at 07/26/15 1004 Last data filed at 07/26/15 0602  Gross per 24 hour  Intake    600 ml  Output    750 ml  Net   -150 ml   LBM: Last BM Date: 07/24/15 Baseline Weight: Weight: 70.8 kg (156 lb 1.4 oz) Most recent weight: Weight: 69.6 kg (153 lb 7 oz)       Palliative Assessment/Data: Flowsheet Rows        Most Recent Value   Intake Tab    Referral Department  Hospitalist   Unit at Time of Referral  Med/Surg Unit   Palliative Care Primary Diagnosis  Cancer   Date Notified  07/20/15   Palliative Care Type  New Palliative care   Reason for referral  Clarify Goals of Care, Counsel Regarding Hospice   Date of Admission  07/20/15   Date first seen by Palliative Care  07/22/15   # of days Palliative referral response time  2 Day(s)   # of days IP prior to Palliative referral  0   Clinical Assessment    Palliative Performance Scale Score  60%   Pain Max last 24 hours  2   Pain Min Last 24 hours  0   Psychosocial & Spiritual Assessment    Palliative Care Outcomes    Patient/Family meeting held?  Yes   Who was at the meeting?  Patient   Palliative Care Outcomes  Clarified goals of care      Additional Data Reviewed: CBC    Component Value Date/Time   WBC 5.1 07/25/2015 0724   WBC 4.9 07/17/2015 1119   RBC 2.94* 07/25/2015 0724   RBC 2.65* 07/17/2015 1119   HGB 8.3* 07/25/2015 0724   HGB 7.3* 07/17/2015 1119   HCT 25.0* 07/25/2015 0724    HCT 22.9* 07/17/2015 1119   PLT 67* 07/25/2015 0724   PLT 86* 07/17/2015 1119   MCV 85.0 07/25/2015 0724   MCV 86.4 07/17/2015 1119   MCH 28.2 07/25/2015 0724   MCH 27.5 07/17/2015 1119   MCHC 33.2 07/25/2015 0724   MCHC 31.9* 07/17/2015 1119   RDW 17.4* 07/25/2015 0724   RDW 19.2* 07/17/2015 1119   LYMPHSABS 0.3* 07/20/2015 0802   LYMPHSABS 0.3* 07/17/2015 1119   MONOABS 0.4 07/20/2015 0802   MONOABS 0.5 07/17/2015 1119   EOSABS 0.0 07/20/2015 0802   EOSABS 0.0 07/17/2015 1119   BASOSABS 0.0 07/20/2015 0802   BASOSABS 0.0 07/17/2015 1119    CMP     Component Value Date/Time   NA 137 07/25/2015 0724   NA 140 08/28/2014 0903   K 3.2* 07/25/2015 0724   K 4.1 08/28/2014 0903   CL 98* 07/25/2015 0724   CO2 29 07/25/2015 0724   CO2 22 08/28/2014 0903   GLUCOSE 134* 07/25/2015 0724   GLUCOSE 156* 08/28/2014 0903   BUN 8 07/25/2015 0724   BUN 10.9 08/28/2014 0903   CREATININE 0.60* 07/25/2015 0724   CREATININE 0.7 08/28/2014 0903   CALCIUM 7.1* 07/25/2015 0724   CALCIUM 9.0 08/28/2014 0903   PROT 5.5* 07/20/2015 0802   PROT 6.7 08/28/2014 0903   ALBUMIN 2.4* 07/20/2015 0802   ALBUMIN 3.7 08/28/2014 0903   AST 29 07/20/2015 0802   AST 11 08/28/2014 0903   ALT 12* 07/20/2015 0802   ALT 8 08/28/2014 0903   ALKPHOS 680* 07/20/2015 0802   ALKPHOS 198* 08/28/2014 0903   BILITOT 1.3* 07/20/2015 0802  BILITOT 0.39 08/28/2014 0903   GFRNONAA >60 07/25/2015 0724   GFRAA >60 07/25/2015 0724       Problem List:  Patient Active Problem List   Diagnosis Date Noted  . Encounter for palliative care   . Symptomatic anemia 07/20/2015  . Acute blood loss anemia 07/06/2015  . Hyperlipidemia 07/04/2015  . Bilateral leg edema   . Gastrointestinal hemorrhage associated with gastric ulcer   . Severe anemia 03/13/2015  . GI bleed 03/13/2015  . CAP (community acquired pneumonia) 12/03/2014  . Anemia 12/03/2014  . Chest pain 12/03/2014  . COPD with emphysema ([place]) 12/03/2014    . High cholesterol 12/03/2014  . Prolonged Q-T interval on ECG 12/03/2014  . Lactic acidosis 12/03/2014  . Nonspecific abnormal electrocardiogram (ECG) (EKG) 12/03/2014  . Gastric cancer ([place]) 01/10/2014  . Acute GI bleeding 11/23/2013  . Melena 11/23/2013  . Diabetes mellitus, type 2 ([place]) 11/23/2013  . Prostate cancer ([place]) 11/23/2013  . Acute lower GI bleeding 11/23/2013     Palliative Care Assessment & Plan    1.Code Status:  DNR    Code Status Orders        Start     Ordered   07/20/15 1029  Do not attempt resuscitation (DNR)   Continuous    Question Answer Comment  In the event of cardiac or respiratory ARREST Do not call a "code blue"   In the event of cardiac or respiratory ARREST Do not perform Intubation, CPR, defibrillation or ACLS   In the event of cardiac or respiratory ARREST Use medication by any route, position, wound care, and other measures to relive pain and suffering. May use oxygen, suction and manual treatment of airway obstruction as needed for comfort.      07/20/15 1028    Code Status History    Date Active Date Inactive Code Status Order ID Comments User Context   07/20/2015 10:12 AM 07/20/2015 10:28 AM Full Code [id number]  Radene Gunning, NP ED   07/04/2015  8:36 PM 07/07/2015  5:45 PM Full Code [id number]  Willia Craze, NP ED   03/13/2015  8:55 PM 03/16/2015  6:36 PM Full Code [id number]  Nita Sells, MD Inpatient   12/03/2014 10:03 AM 12/04/2014  4:19 PM Full Code [id number]  Melton Alar, PA-C Inpatient   11/23/2013  5:31 PM 11/25/2013  3:51 PM Full Code [id number]  Charlynne Cousins, MD Inpatient       2. Goals of Care/Additional Recommendations:  Hemoglobin stable for the past 24-48 hours, patient with no external bleed, awake but some what confused at times. Discussed disposition options with the patient's son Cecilie Lowers over the phone on 3-9.    3. Symptom Management:      1.  morphine prn.  4. Palliative Prophylaxis:   Aspiration,  Delirium Protocol and Frequent Pain Assessment  5. Prognosis:  ?weeks    6. Discharge Planning: SNF rehab with palliative follow up.   Care plan was discussed with patient, his son Cecilie Lowers (over the phone on 3-9) and Dr. Eliseo Squires this am.  Thank you for allowing the Palliative Medicine Team to assist in the care of this patient.   Time In:  9 Time Out:  925 Total Time  25 Prolonged Time Billed  No       [id number] Loistine Chance, MD  07/26/2015, 10:04 AM  Please contact Palliative Medicine Team phone at [phone] for questions and concerns.

## 2015-07-26 NOTE — Care Management Important Message (Signed)
Important Message  Patient Details  Name: Melvin Gardner MRN: [id number] Date of Birth: April 21, 1930   Medicare Important Message Given:  Yes    Tawnia Schirm P Morgan 07/26/2015, 1:08 PM

## 2015-07-26 NOTE — Discharge Summary (Signed)
Physician Discharge Summary  KOVIN DEMMA [id number] DOB: 10-30-1929 DOA: 07/20/2015  PCP: [name]., MD  Admit date: 07/20/2015 Discharge date: 07/26/2015  Time spent: 35 minutes  Recommendations for Outpatient Follow-up:  1. palliative to follow at SNF 2. DNR 3. Avoid rehospitalizations   Discharge Diagnoses:  Principal Problem:   Symptomatic anemia Active Problems:   Diabetes mellitus, type 2 (HCC)   Prostate cancer ([place])   Gastric cancer ([place])   Anemia   COPD with emphysema ([place])   Bilateral leg edema   Encounter for palliative care   Discharge Condition: improved  Diet recommendation: regular  Filed Weights   07/23/15 2119 07/24/15 2128 07/25/15 2114  Weight: 73.982 kg (163 lb 1.6 oz) 70.534 kg (155 lb 8 oz) 69.6 kg (153 lb 7 oz)    History of present illness:  Melvin Gardner is a delightful 82 y.o. male with a past medical history of diabetes, COPD, advanced prostate cancer as well as gastric cancer status post radiation since emergency department with the chief complaint of worsening shortness of breath increased cough and generalized weakness. Initial evaluation reveals acute on chronic anemia with a hemoglobin of 6.3 and tachycardia.  Information is obtained from the patient. He reports recent hospitalization discharged on a february 19th for seeing anemia related to ongoing GI bleed in the setting of advanced gastric cancer. Since he's been home he reports gradual worsening of shortness of breath increased sputum production and increased lower extremity edema. Associated symptoms include persistent loose stool that is "very very dark". He denies bright red blood per rectum. He denies abdominal pain nausea vomiting hematemesis. He denies chest pain palpitations headache dizziness syncope or near-syncope. He does report a gradual worsening of generalized weakness.  Emergency department he is afebrile and hemodynamically stable tachycardia of 114 he is not  hypoxic.  Hospital Course:  Symptomatic anemia - pt is s/p transfusions. H/hstable    Diabetes mellitus, type 2 (HCC) - sliding scale insulin   Prostate cancer ([place])- advanced   Gastric cancer ([place]) - Most likely cause of principal problem   COPD with emphysema ([place]) - Currently compensated, continue albuterol as needed  Procedures:    Consultations:  Palliative care  Discharge Exam: Filed Vitals:   07/26/15 0600 07/26/15 0845  BP: 114/60 116/71  Pulse: 118 116  Temp: 99 F (37.2 C) 98.8 F (37.1 C)  Resp: 20 18    General: pleasant, NAD   Discharge Instructions   Discharge Instructions    Diet - low sodium heart healthy    Complete by:  As directed      Discharge instructions    Complete by:  As directed   Palliative services at SNF DNR  -avoid rehospitalizations PRN O2     Increase activity slowly    Complete by:  As directed           Current Discharge Medication List    START taking these medications   Details  benzonatate (TESSALON) 100 MG capsule Take 1 capsule (100 mg total) by mouth 3 (three) times daily as needed for cough. Qty: 20 capsule, Refills: 0    chlorpheniramine-HYDROcodone (TUSSIONEX) 10-8 MG/5ML SUER Take 5 mLs by mouth every 12 (twelve) hours as needed for cough. Qty: 115 mL, Refills: 0    Morphine Sulfate (MORPHINE CONCENTRATE) 10 MG/0.5ML SOLN concentrated solution Take 0.25 mLs (5 mg total) by mouth every hour as needed for moderate pain, severe pain or shortness of breath (cough). Qty: 42 mL  traZODone (DESYREL) 50 MG tablet Take 0.5 tablets (25 mg total) by mouth at bedtime as needed for sleep.      CONTINUE these medications which have NOT CHANGED   Details  acetaminophen (TYLENOL) 500 MG tablet Take 500 mg by mouth every 6 (six) hours as needed for mild pain.    albuterol (PROVENTIL HFA;VENTOLIN HFA) 108 (90 BASE) MCG/ACT inhaler Inhale 2 puffs into the lungs every 6 (six) hours as needed for wheezing.     albuterol (PROVENTIL) (2.5 MG/3ML) 0.083% nebulizer solution Take 2.5 mg by nebulization every 6 (six) hours as needed for wheezing.    bicalutamide (CASODEX) 50 MG tablet Take 50 mg by mouth daily.    ferrous sulfate 325 (65 FE) MG tablet Take 325 mg by mouth daily with breakfast.    Fluticasone-Salmeterol (ADVAIR) 250-50 MCG/DOSE AEPB Inhale 1 puff into the lungs every 12 (twelve) hours.    ipratropium (ATROVENT) 0.02 % nebulizer solution As directed Refills: 11   Associated Diagnoses: Malignant neoplasm of stomach, unspecified location (HCC)    memantine (NAMENDA) 10 MG tablet Take 10 mg by mouth. As needed Refills: 5   Associated Diagnoses: Malignant neoplasm of stomach, unspecified location (HCC)    metFORMIN (GLUCOPHAGE) 500 MG tablet Take 1,000 mg by mouth 2 (two) times daily with a meal.    pantoprazole (PROTONIX) 40 MG tablet Take 1 tablet (40 mg total) by mouth 2 (two) times daily. Qty: 60 tablet, Refills: 3    rosuvastatin (CRESTOR) 10 MG tablet Take 10 mg by mouth daily.    sitaGLIPtin (JANUVIA) 50 MG tablet Take 50 mg by mouth daily.    tamsulosin (FLOMAX) 0.4 MG CAPS Take 0.4 mg by mouth daily after breakfast.       No Known Allergies    The results of significant diagnostics from this hospitalization (including imaging, microbiology, ancillary and laboratory) are listed below for reference.    Significant Diagnostic Studies: Dg Chest 2 View  07/20/2015  CLINICAL DATA:  Worsening shortness of breath this morning, chronic cough with history of asthma, former smoker ; repeat PA view to exclude pneumothorax EXAM: CHEST  2 VIEW COMPARISON:  07/20/2015 FINDINGS: Expiratory PA radiograph confirms the absence of pneumothorax. Findings otherwise unchanged with mild left base atelectasis. IMPRESSION: No pneumothorax. Electronically Signed   By: Skipper Cliche M.D.   On: 07/20/2015 09:32   Dg Chest Port 1 View  07/20/2015  CLINICAL DATA:  Shortness of breath getting worse  this morning, chronic cough EXAM: PORTABLE CHEST 1 VIEW COMPARISON:  07/04/2015 FINDINGS: Mild elevation of the left diaphragm, stable. Mild left base atelectasis. Heart size upper normal and stable. Vascular pattern normal. Right lung is free of infiltrate or opacity. No pleural effusions. A probable skin fold over the lateral right lung simulates a pneumothorax. IMPRESSION: An apparent skin fold simulates a right pneumothorax. To confirm this, repeat PA view in expiration recommended. Study otherwise negative except for mild left base atelectasis. Electronically Signed   By: Skipper Cliche M.D.   On: 07/20/2015 08:36   Dg Chest Port 1 View  07/04/2015  CLINICAL DATA:  Dyspnea. EXAM: PORTABLE CHEST 1 VIEW COMPARISON:  March 13, 2015. FINDINGS: The heart size and mediastinal contours are within normal limits. Both lungs are clear. No pneumothorax or pleural effusion is noted. Elevated left hemidiaphragm is noted. The visualized skeletal structures are unremarkable. IMPRESSION: No acute cardiopulmonary abnormality seen. Electronically Signed   By: Marijo Conception, M.D.   On: 07/04/2015 21:33  Microbiology: Recent Results (from the past 240 hour(s))  TECHNOLOGIST REVIEW     Status: None   Collection Time: 07/17/15 11:19 AM  Result Value Ref Range Status   Technologist Review   Final    Sl poly, Few teardrop, ovalos, schistocytes, target, and burr cells, occ helmet and acanthocyte     Labs: Basic Metabolic Panel:  Recent Labs Lab 07/21/15 1200 07/22/15 0344 07/23/15 0611 07/24/15 0550 07/25/15 0724  NA 134* 137 135 136 137  K 4.1 4.0 3.6 3.5 3.2*  CL 99* 102 99* 97* 98*  CO2 25 25 26 26 29   GLUCOSE 151* 121* 132* 135* 134*  BUN 22* 17 13 12 8   CREATININE 0.60* 0.61 0.58* 0.55* 0.60*  CALCIUM 8.0* 7.9* 7.5* 7.2* 7.1*   Liver Function Tests:  Recent Labs Lab 07/20/15 0802  AST 29  ALT 12*  ALKPHOS 680*  BILITOT 1.3*  PROT 5.5*  ALBUMIN 2.4*   No results for input(s):  LIPASE, AMYLASE in the last 168 hours. No results for input(s): AMMONIA in the last 168 hours. CBC:  Recent Labs Lab 07/20/15 0802  07/22/15 0344 07/23/15 0611 07/23/15 1830 07/24/15 0550 07/25/15 0724  WBC 4.4  < > 2.9* 3.4* 3.4* 3.7* 5.1  NEUTROABS 3.8  --   --   --   --   --   --   HGB 6.3*  < > 7.4* 8.5* 8.2* 8.1* 8.3*  HCT 20.0*  < > 23.3* 25.2* 24.1* 24.2* 25.0*  MCV 85.1  < > 85.7 84.3 84.0 84.9 85.0  PLT 80*  < > 72* 67* 62* 65* 67*  < > = values in this interval not displayed. Cardiac Enzymes: No results for input(s): CKTOTAL, CKMB, CKMBINDEX, TROPONINI in the last 168 hours. BNP: BNP (last 3 results)  Recent Labs  03/13/15 1727 07/04/15 2214 07/20/15 0802  BNP 92.4 132.3* 187.9*    ProBNP (last 3 results) No results for input(s): PROBNP in the last 8760 hours.  CBG:  Recent Labs Lab 07/25/15 0734 07/25/15 1149 07/25/15 1639 07/25/15 2114 07/26/15 0815  GLUCAP 121* 143* 107* 129* 141*       Signed:  JESSICA U VANN DO.  [facility] 07/26/2015, 12:06 PM

## 2015-07-26 NOTE — Clinical Social Work Placement (Signed)
   CLINICAL SOCIAL WORK PLACEMENT  NOTE 07/26/15 - DISCHARGED TO [facility]  Date:  07/26/2015  Patient Details  Name: Melvin Gardner MRN: [id number] Date of Birth: [birth year]  Clinical Social Work is seeking post-discharge placement for this patient at the [place] level of care (*CSW will initial, date and re-position this form in  chart as items are completed):  Yes (Emailed to son Warden/ranger)   Patient/family provided with [place] Work Department's list of facilities offering this level of care within the geographic area requested by the patient (or if unable, by the patient's family).  Yes   Patient/family informed of their freedom to choose among providers that offer the needed level of care, that participate in Medicare, Medicaid or managed care program needed by the patient, have an available bed and are willing to accept the patient.  Yes   Patient/family informed of [employer]'s ownership interest in [facility] and [facility], as well as of the fact that they are under no obligation to receive care at these facilities.  PASRR submitted to EDS on 07/25/15     PASRR number received on 07/25/15     Existing PASRR number confirmed on       FL2 transmitted to all facilities in geographic area requested by pt/family on 07/25/15     FL2 transmitted to all facilities within larger geographic area on       Patient informed that his/her managed care company has contracts with or will negotiate with certain facilities, including the following:         07/25/15 - Patient/family informed of bed offers received.  Patient/*son-Melvin Gardner chooses bed at  [facility]     Physician recommends and patient chooses bed at      Patient to be transferred to  [facility] on  07/26/15.  Patient to be transferred to facility by  ambulance.  Patient family notified on  07/26/15 of transfer.  Name of family member notified:    Melvin Gardner [phone]     PHYSICIAN       Additional Comment:    _______________________________________________ Sable Feil, LCSW 07/26/2015, 1:51 PM

## 2015-07-30 ENCOUNTER — Ambulatory Visit: Payer: Medicare Other

## 2015-08-12 ENCOUNTER — Emergency Department (HOSPITAL_COMMUNITY)
Admission: EM | Admit: 2015-08-12 | Discharge: 2015-08-17 | Disposition: E | Payer: Medicare Other | Attending: Emergency Medicine | Admitting: Emergency Medicine

## 2015-08-12 ENCOUNTER — Encounter (HOSPITAL_COMMUNITY): Payer: Self-pay | Admitting: Emergency Medicine

## 2015-08-12 DIAGNOSIS — Z7984 Long term (current) use of oral hypoglycemic drugs: Secondary | ICD-10-CM | POA: Insufficient documentation

## 2015-08-12 DIAGNOSIS — J449 Chronic obstructive pulmonary disease, unspecified: Secondary | ICD-10-CM | POA: Diagnosis not present

## 2015-08-12 DIAGNOSIS — Z79899 Other long term (current) drug therapy: Secondary | ICD-10-CM | POA: Diagnosis not present

## 2015-08-12 DIAGNOSIS — Z85028 Personal history of other malignant neoplasm of stomach: Secondary | ICD-10-CM | POA: Diagnosis not present

## 2015-08-12 DIAGNOSIS — Z7951 Long term (current) use of inhaled steroids: Secondary | ICD-10-CM | POA: Diagnosis not present

## 2015-08-12 DIAGNOSIS — Z87891 Personal history of nicotine dependence: Secondary | ICD-10-CM | POA: Insufficient documentation

## 2015-08-12 DIAGNOSIS — Z8669 Personal history of other diseases of the nervous system and sense organs: Secondary | ICD-10-CM | POA: Diagnosis not present

## 2015-08-12 DIAGNOSIS — E7800 Pure hypercholesterolemia, unspecified: Secondary | ICD-10-CM | POA: Diagnosis not present

## 2015-08-12 DIAGNOSIS — E119 Type 2 diabetes mellitus without complications: Secondary | ICD-10-CM | POA: Diagnosis not present

## 2015-08-12 DIAGNOSIS — Z9889 Other specified postprocedural states: Secondary | ICD-10-CM | POA: Insufficient documentation

## 2015-08-12 DIAGNOSIS — M1712 Unilateral primary osteoarthritis, left knee: Secondary | ICD-10-CM | POA: Insufficient documentation

## 2015-08-12 DIAGNOSIS — I469 Cardiac arrest, cause unspecified: Secondary | ICD-10-CM | POA: Diagnosis present

## 2015-08-12 DIAGNOSIS — E875 Hyperkalemia: Secondary | ICD-10-CM | POA: Diagnosis not present

## 2015-08-12 DIAGNOSIS — Z8546 Personal history of malignant neoplasm of prostate: Secondary | ICD-10-CM | POA: Diagnosis not present

## 2015-08-12 DIAGNOSIS — D649 Anemia, unspecified: Secondary | ICD-10-CM | POA: Insufficient documentation

## 2015-08-12 LAB — I-STAT TROPONIN, ED: TROPONIN I, POC: 0.05 ng/mL (ref 0.00–0.08)

## 2015-08-12 LAB — I-STAT CG4 LACTIC ACID, ED: Lactic Acid, Venous: 15.64 mmol/L (ref 0.5–2.0)

## 2015-08-12 MED ORDER — CALCIUM CHLORIDE 10 % IV SOLN
INTRAVENOUS | Status: DC | PRN
  Administered 2015-08-12: 1 g via INTRAVENOUS

## 2015-08-12 MED ORDER — ATROPINE SULFATE 1 MG/ML IJ SOLN
INTRAMUSCULAR | Status: DC | PRN
  Administered 2015-08-12: 1 mg via INTRAVENOUS

## 2015-08-12 MED FILL — Medication: Qty: 1 | Status: AC

## 2015-08-17 NOTE — Progress Notes (Signed)
Pt.'s family is in route from [place].

## 2015-08-17 NOTE — ED Notes (Signed)
Patient is from home, was found to be unresponsive by family, EMS called to house, patient was found to be apneic and pulseless by fire department.  Unknown downtime of patient per EMS.  CPR started and 4 rounds of epi given.  Patient had multiple rhythm changes en route to ED per EMS. Patient regained ROSC x3 with EMS.  Pupils are fixed and dilated.  Patient has been given 8 epi en route, 4mg  of Narcan, 300mg  Amiodarone, shocked twice (200/300J), one amp bicarb.  Epi drip started by EMS en route to ED.  Patient with weak pulses on arrival to ED.

## 2015-08-17 NOTE — ED Notes (Signed)
Patient taken to the morgue at this time.

## 2015-08-17 NOTE — Code Documentation (Signed)
Pulse check, no pulse, CPR resumed

## 2015-08-17 NOTE — ED Provider Notes (Addendum)
CSN: [id number]     Arrival date & time    History  By signing my name below, I, [name], attest that this documentation has been prepared under the direction and in the presence of Everlene Balls, MD. Electronically Signed: [name], ED Scribe. 2015/09/09. 4:03 AM   No chief complaint on file.  The history is provided by the EMS personnel. No language interpreter was used.    Brought in by EMS after CPR, Melvin Gardner is a 82 y.o. male with history of prostate and stomach cancer s/p radiation, DM, hypercholesteremia, COPD, and asthma who presents to the Emergency Department for evaluation of cardiac arrest. The downtime is unknown by EMS, as he was found by a relative unable to provide a reliable account due to emotional upset. Ems noted the pt to have PEA less than 20.  He has been given 3-4 rounds of epinephrine, amiodarone, narcan by EMS.   Past Medical History  Diagnosis Date  . Asthma   . High cholesterol   . Type II diabetes mellitus ([place])   . Anemia   . Arthritis     "left knee" (11/23/2013)  . COPD (chronic obstructive pulmonary disease) ([place])     [name] 09/17/2010  . Prostate cancer ([place]) 06/03/2001    bilat involvement,adenocarcinoma,gleason 6(3+3) PSA=37.6  . Stomach cancer ([place]) 11/24/13    adenocarcinoma   . History of blood transfusion 11/23/2013    related to lower GI bleeding  . History of radiation therapy 03/28/2003-05/25/2003    Prostate  . Glaucoma   . Hx of radiation therapy 01/15/14-109/15    gastric cancer   Past Surgical History  Procedure Laterality Date  . Tumor excision  10 years ago    "off my spinal cord"  . Hemorrhoid surgery  09/2000    [name] 09/30/2010  . Cardiac catheterization      [name] 09/30/2010  . Esophagogastroduodenoscopy Left 11/24/2013    Procedure: ESOPHAGOGASTRODUODENOSCOPY (EGD);  Surgeon: Juanita Craver, MD;  Location: [facility] ENDOSCOPY;  Service: Endoscopy;  Laterality: Left;  . Esophagogastroduodenoscopy N/A 03/15/2015   Procedure: ESOPHAGOGASTRODUODENOSCOPY (EGD);  Surgeon: [name], MD;  Location: [facility] ENDOSCOPY;  Service: Endoscopy;  Laterality: N/A;  . Esophagogastroduodenoscopy N/A 07/05/2015    Procedure: ESOPHAGOGASTRODUODENOSCOPY (EGD);  Surgeon: [name], MD;  Location: [facility] ENDOSCOPY;  Service: Endoscopy;  Laterality: N/A;   Family History  Problem Relation Age of Onset  . Diabetes Mellitus II Mother   . Other Father    Social History  Substance Use Topics  . Smoking status: Former Smoker -- 1.00 packs/day for 5 years    Types: Cigarettes  . Smokeless tobacco: Never Used     Comment: "quit smoking in the 1950's"  . Alcohol Use: No     Comment: 11/23/2013 "don't drink anything now; never was a drinker" quit drugs years ago    Review of Systems  Unable to perform ROS: Patient unresponsive   Allergies  Review of patient's allergies indicates no known allergies.  Home Medications   Prior to Admission medications   Medication Sig Start Date End Date Taking? Authorizing Provider  acetaminophen (TYLENOL) 500 MG tablet Take 500 mg by mouth every 6 (six) hours as needed for mild pain.    Historical Provider, MD  albuterol (PROVENTIL HFA;VENTOLIN HFA) 108 (90 BASE) MCG/ACT inhaler Inhale 2 puffs into the lungs every 6 (six) hours as needed for wheezing.    Historical Provider, MD  albuterol (PROVENTIL) (2.5 MG/3ML) 0.083% nebulizer solution Take 2.5 mg by  nebulization every 6 (six) hours as needed for wheezing.    Historical Provider, MD  benzonatate (TESSALON) 100 MG capsule Take 1 capsule (100 mg total) by mouth 3 (three) times daily as needed for cough. 07/26/15   Geradine Girt, DO  bicalutamide (CASODEX) 50 MG tablet Take 50 mg by mouth daily. 11/16/13   Historical Provider, MD  chlorpheniramine-HYDROcodone (TUSSIONEX) 10-8 MG/5ML SUER Take 5 mLs by mouth every 12 (twelve) hours as needed for cough. 07/26/15   Geradine Girt, DO  ferrous sulfate 325 (65 FE) MG tablet Take 325 mg by mouth daily  with breakfast.    Historical Provider, MD  Fluticasone-Salmeterol (ADVAIR) 250-50 MCG/DOSE AEPB Inhale 1 puff into the lungs every 12 (twelve) hours.    Historical Provider, MD  ipratropium (ATROVENT) 0.02 % nebulizer solution As directed 06/17/15   Historical Provider, MD  memantine (NAMENDA) 10 MG tablet Take 10 mg by mouth. As needed 05/29/15   Historical Provider, MD  metFORMIN (GLUCOPHAGE) 500 MG tablet Take 1,000 mg by mouth 2 (two) times daily with a meal.    Historical Provider, MD  Morphine Sulfate (MORPHINE CONCENTRATE) 10 MG/0.5ML SOLN concentrated solution Take 0.25 mLs (5 mg total) by mouth every hour as needed for moderate pain, severe pain or shortness of breath (cough). 07/26/15   Geradine Girt, DO  pantoprazole (PROTONIX) 40 MG tablet Take 1 tablet (40 mg total) by mouth 2 (two) times daily. 07/07/15   Charlynne Cousins, MD  rosuvastatin (CRESTOR) 10 MG tablet Take 10 mg by mouth daily.    Historical Provider, MD  sitaGLIPtin (JANUVIA) 50 MG tablet Take 50 mg by mouth daily.    Historical Provider, MD  tamsulosin (FLOMAX) 0.4 MG CAPS Take 0.4 mg by mouth daily after breakfast.    Historical Provider, MD  traZODone (DESYREL) 50 MG tablet Take 0.5 tablets (25 mg total) by mouth at bedtime as needed for sleep. 07/26/15   Geradine Girt, DO   There were no vitals taken for this visit. Physical Exam  Constitutional: Vital signs are normal.  Non-toxic appearance. He does not appear ill. He appears distressed.  HENT:  Head: Normocephalic and atraumatic.  Patient intubated  Eyes: Conjunctivae and EOM are normal. Pupils are equal, round, and reactive to light. No scleral icterus.  Neck: No tracheal deviation, no edema, no erythema and normal range of motion present. No thyroid mass and no thyromegaly present.  Cardiovascular: S1 normal, S2 normal and normal pulses.   cpr in progress  Pulmonary/Chest: He has no rhonchi.  Respiratory failure  Abdominal: Soft. Normal appearance. He  exhibits no distension, no ascites and no mass. There is no hepatosplenomegaly. There is no tenderness. There is no CVA tenderness.  Lymphadenopathy:    He has no cervical adenopathy.  Neurological: He has normal strength. No sensory deficit.  GCS 3  Skin: Skin is warm, dry and intact. No petechiae and no rash noted. He is not diaphoretic. No erythema. No pallor.  Nursing note and vitals reviewed.   ED Course  Procedures (including critical care time) Labs Review Labs Reviewed  I-STAT CG4 LACTIC ACID, ED - Abnormal; Notable for the following:    Lactic Acid, Venous 15.64 (*)    All other components within normal limits  I-STAT TROPOININ, ED    Imaging Review No results found.   EKG Interpretation None      MDM   Final diagnoses:  None    Patient presents to the ED in cardiac  arrest.  EMS got pulses back on 3 different occasions and he immediately lost pulses upon ED arrival.  CPR was performed with atropine, calcium and epi given.  Bedside US revealed no cardiac activity x 2.  We could not get pulses back after multiple rounds of CPR.  Blood work reveals lactate of 15, troponin is neg, and hgb could not be recorded. Chaplain will contact family.  Patient expired at 3:59am.  I spoke with the medical examiner who does not accept the patient and states he likely died of natural causes.  Cardiopulmonary Resuscitation (CPR) Procedure Note Directed/Performed by: Everlene Balls I personally directed ancillary staff and/or performed CPR in an effort to regain return of spontaneous circulation and to maintain cardiac, neuro and systemic perfusion.     I personally performed the services described in this documentation, which was scribed in my presence. The recorded information has been reviewed and is accurate.       Everlene Balls, MD 09-07-15 [id number]  Everlene Balls, MD 09/07/2015 [phone]

## 2015-08-17 NOTE — Code Documentation (Signed)
CPR started.

## 2015-08-17 NOTE — Code Documentation (Signed)
Pulse check, no pulses, CPR resumed

## 2015-08-17 NOTE — Code Documentation (Signed)
Epi drip discontinued at this time by Dr Claudine Mouton.

## 2015-08-17 NOTE — Code Documentation (Signed)
Pulse check, no pulses.  Ultrasound at bedside showing no cardiac activity Per Dr Claudine Mouton.

## 2015-08-17 DEATH — deceased

## 2015-08-19 DIAGNOSIS — Z7189 Other specified counseling: Secondary | ICD-10-CM | POA: Insufficient documentation

## 2015-09-17 ENCOUNTER — Other Ambulatory Visit: Payer: Medicare Other

## 2015-09-17 ENCOUNTER — Ambulatory Visit: Payer: Medicare Other | Admitting: Oncology

## 2015-11-01 ENCOUNTER — Other Ambulatory Visit: Payer: Self-pay | Admitting: Nurse Practitioner

## 2017-01-15 IMAGING — CR DG CHEST 1V PORT
1 series · 1 of 1 positions shown · non-contrast
Comparison: 07/04/2015

CLINICAL DATA: Shortness of breath getting worse this morning,
chronic cough

EXAM:
PORTABLE CHEST 1 VIEW

[AP]
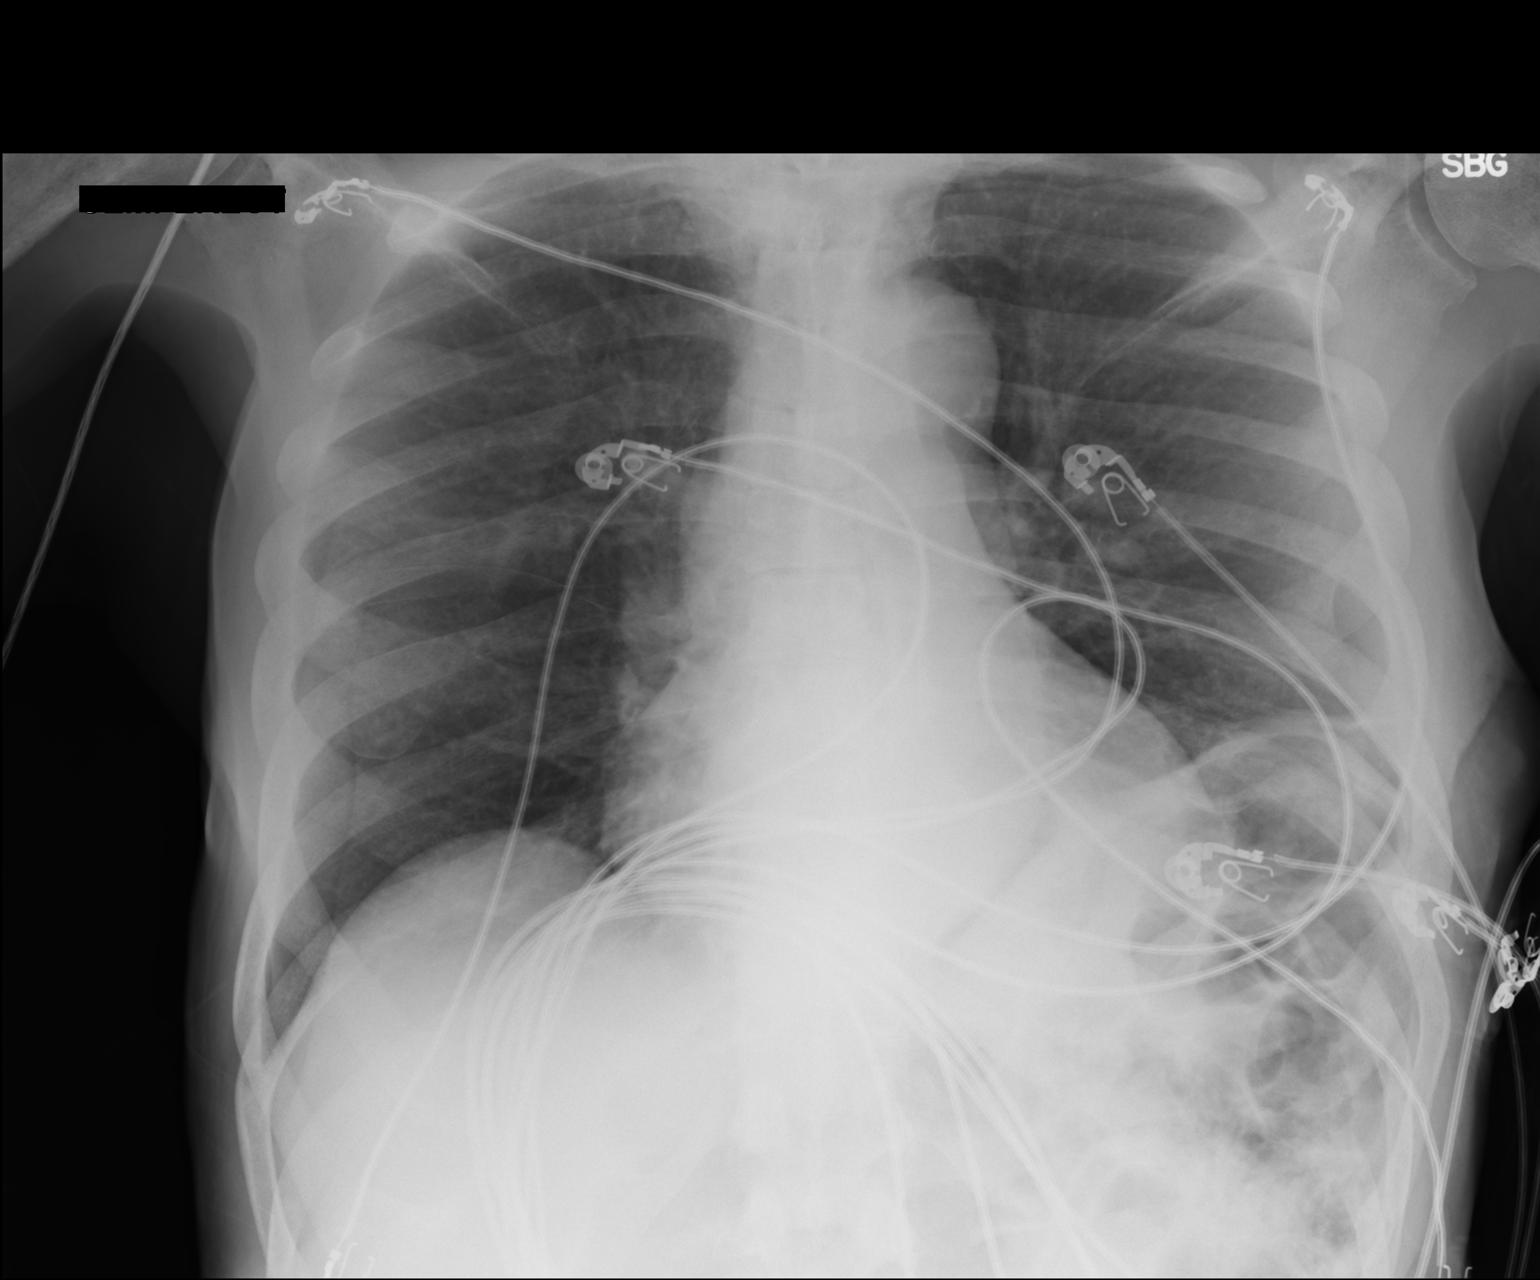

[1 of 1 positions shown; findings below may reference images not displayed]

FINDINGS: Mild elevation of the left diaphragm, stable. Mild left base
atelectasis. Heart size upper normal and stable. Vascular pattern
normal. Right lung is free of infiltrate or opacity. No pleural
effusions.

A probable skin fold over the lateral right lung simulates a
pneumothorax.
IMPRESSION: An apparent skin fold simulates a right pneumothorax. To confirm
this, repeat PA view in expiration recommended.

Study otherwise negative except for mild left base atelectasis.

## 2017-01-15 IMAGING — DX DG CHEST 2V
2 series · 2 of 2 positions shown · non-contrast
Comparison: 07/20/2015

CLINICAL DATA: Worsening shortness of breath this morning, chronic
cough with history of asthma, former smoker ; repeat PA view to
exclude pneumothorax

EXAM:
CHEST  2 VIEW

[w chest pa]
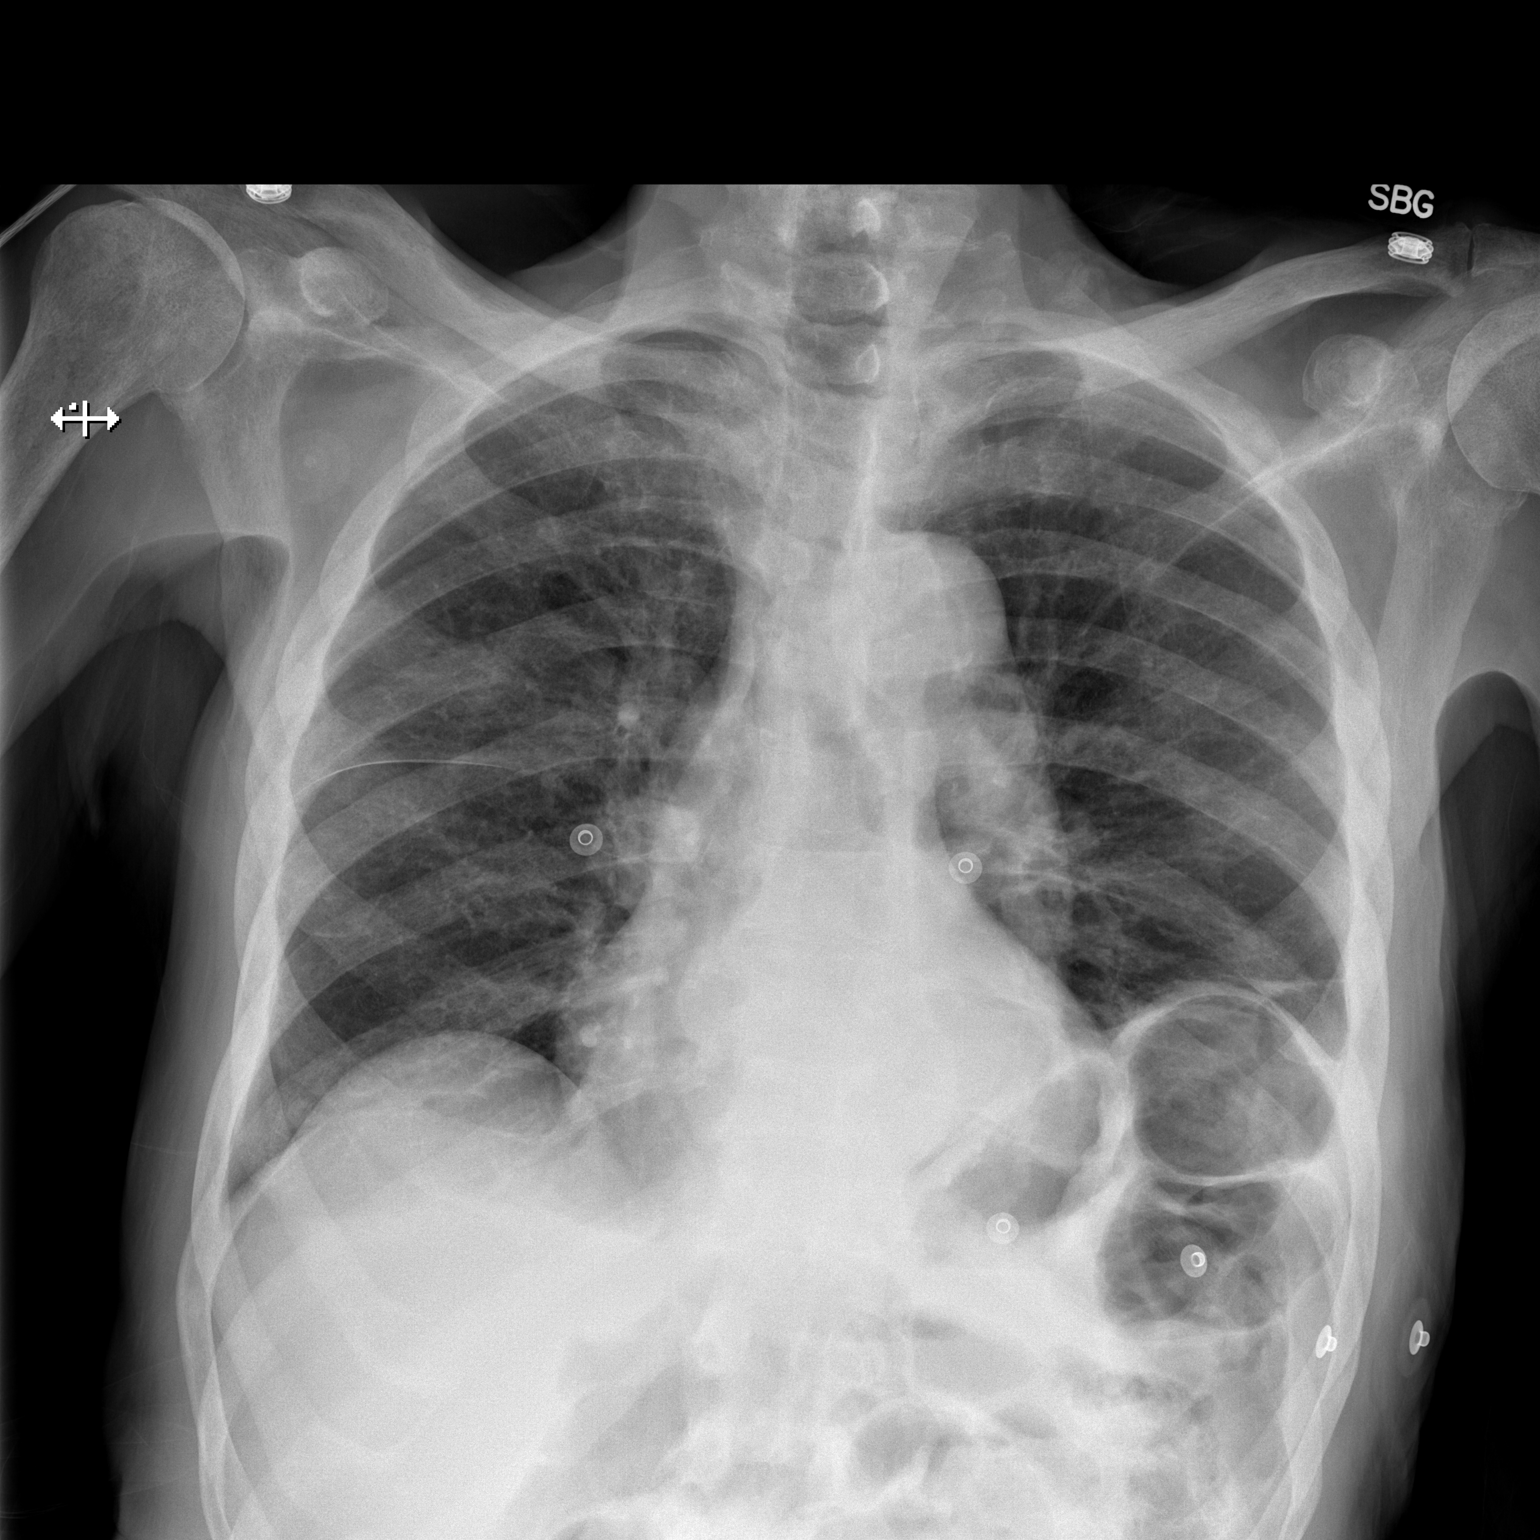

[w chest lat]
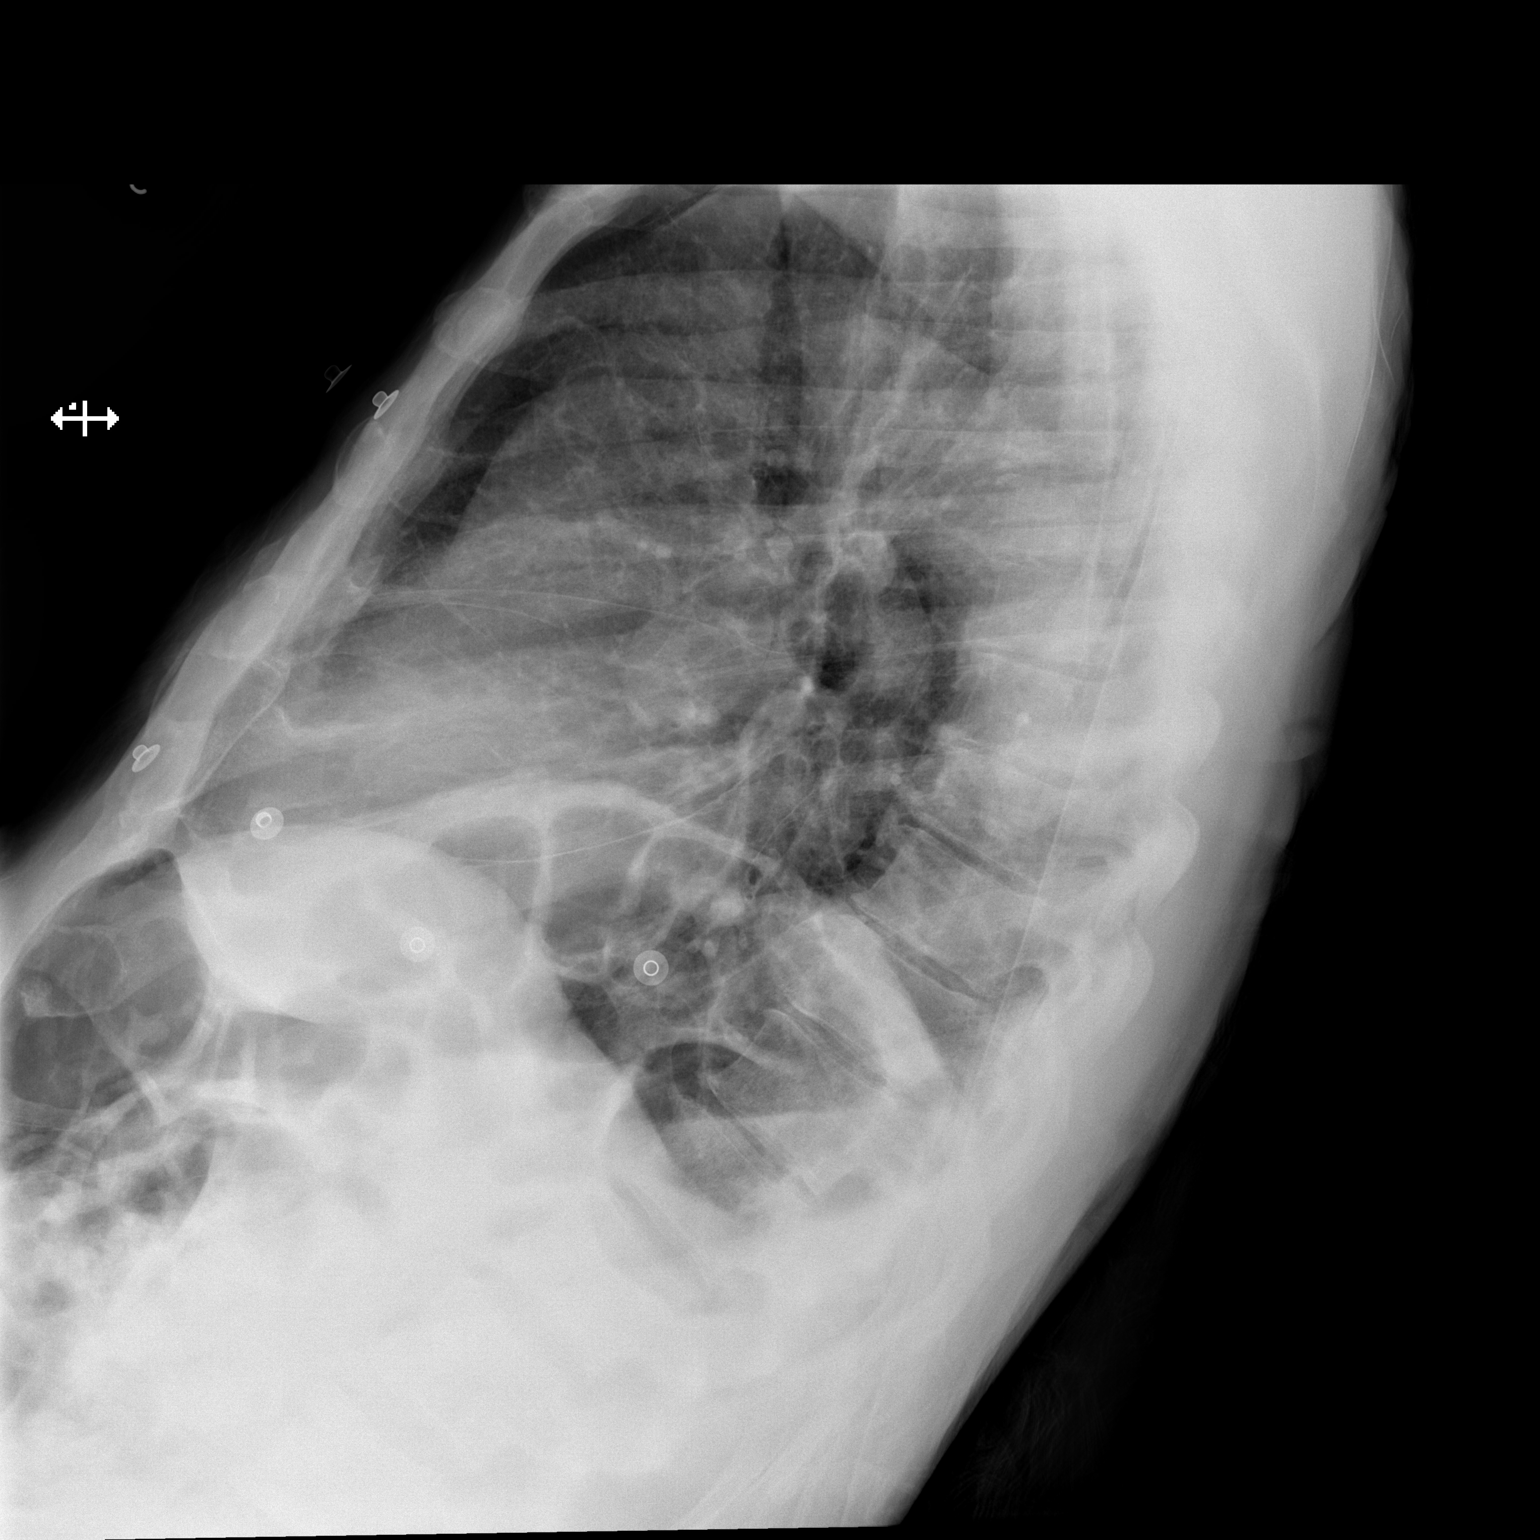

[2 of 2 positions shown; findings below may reference images not displayed]

FINDINGS: Expiratory PA radiograph confirms the absence of pneumothorax.
Findings otherwise unchanged with mild left base atelectasis.
IMPRESSION: No pneumothorax.
